# Patient Record
Sex: Female | Born: 1955 | Race: White | Hispanic: No | Marital: Married | Smoking: Never smoker
Health system: Southern US, Community
[De-identification: ages and names within clinical notes are randomized; demographics above are authoritative.]

## PROBLEM LIST (undated history)

## (undated) DIAGNOSIS — T7840XA Allergy, unspecified, initial encounter: Secondary | ICD-10-CM

## (undated) DIAGNOSIS — J189 Pneumonia, unspecified organism: Secondary | ICD-10-CM

## (undated) DIAGNOSIS — M6530 Trigger finger, unspecified finger: Secondary | ICD-10-CM

## (undated) DIAGNOSIS — M722 Plantar fascial fibromatosis: Secondary | ICD-10-CM

## (undated) DIAGNOSIS — F430 Acute stress reaction: Secondary | ICD-10-CM

## (undated) DIAGNOSIS — N3010 Interstitial cystitis (chronic) without hematuria: Secondary | ICD-10-CM

## (undated) DIAGNOSIS — R739 Hyperglycemia, unspecified: Secondary | ICD-10-CM

## (undated) DIAGNOSIS — K5900 Constipation, unspecified: Secondary | ICD-10-CM

## (undated) HISTORY — DX: Constipation, unspecified: K59.00

## (undated) HISTORY — DX: Hyperglycemia, unspecified: R73.9

## (undated) HISTORY — DX: Trigger finger, unspecified finger: M65.30

## (undated) HISTORY — PX: APPENDECTOMY: SHX54

## (undated) HISTORY — DX: Plantar fascial fibromatosis: M72.2

## (undated) HISTORY — PX: WISDOM TOOTH EXTRACTION: SHX21

## (undated) HISTORY — DX: Acute stress reaction: F43.0

## (undated) HISTORY — DX: Pneumonia, unspecified organism: J18.9

## (undated) HISTORY — DX: Allergy, unspecified, initial encounter: T78.40XA

## (undated) HISTORY — DX: Interstitial cystitis (chronic) without hematuria: N30.10

---

## 1999-07-02 ENCOUNTER — Other Ambulatory Visit: Admission: RE | Admit: 1999-07-02 | Discharge: 1999-07-02 | Payer: Self-pay | Admitting: Family Medicine

## 1999-12-30 ENCOUNTER — Encounter: Payer: Self-pay | Admitting: Family Medicine

## 1999-12-30 ENCOUNTER — Encounter: Admission: RE | Admit: 1999-12-30 | Discharge: 1999-12-30 | Payer: Self-pay | Admitting: Family Medicine

## 2000-08-05 ENCOUNTER — Encounter: Admission: RE | Admit: 2000-08-05 | Discharge: 2000-08-27 | Payer: Self-pay | Admitting: Family Medicine

## 2000-08-14 ENCOUNTER — Other Ambulatory Visit: Admission: RE | Admit: 2000-08-14 | Discharge: 2000-08-14 | Payer: Self-pay | Admitting: Family Medicine

## 2000-12-30 ENCOUNTER — Encounter: Payer: Self-pay | Admitting: Family Medicine

## 2000-12-30 ENCOUNTER — Encounter: Admission: RE | Admit: 2000-12-30 | Discharge: 2000-12-30 | Payer: Self-pay | Admitting: Family Medicine

## 2001-12-23 ENCOUNTER — Other Ambulatory Visit: Admission: RE | Admit: 2001-12-23 | Discharge: 2001-12-23 | Payer: Self-pay | Admitting: Family Medicine

## 2002-04-05 ENCOUNTER — Encounter: Admission: RE | Admit: 2002-04-05 | Discharge: 2002-04-05 | Payer: Self-pay | Admitting: Family Medicine

## 2002-04-05 ENCOUNTER — Encounter: Payer: Self-pay | Admitting: Family Medicine

## 2003-04-12 ENCOUNTER — Encounter: Admission: RE | Admit: 2003-04-12 | Discharge: 2003-04-12 | Payer: Self-pay | Admitting: Family Medicine

## 2004-01-03 ENCOUNTER — Other Ambulatory Visit: Admission: RE | Admit: 2004-01-03 | Discharge: 2004-01-03 | Payer: Self-pay | Admitting: Family Medicine

## 2004-04-16 ENCOUNTER — Encounter: Admission: RE | Admit: 2004-04-16 | Discharge: 2004-04-16 | Payer: Self-pay | Admitting: Family Medicine

## 2004-05-06 ENCOUNTER — Ambulatory Visit: Payer: Self-pay | Admitting: Family Medicine

## 2004-05-11 ENCOUNTER — Ambulatory Visit: Payer: Self-pay | Admitting: Family Medicine

## 2004-10-22 ENCOUNTER — Ambulatory Visit: Payer: Self-pay | Admitting: Family Medicine

## 2005-04-22 ENCOUNTER — Encounter: Admission: RE | Admit: 2005-04-22 | Discharge: 2005-04-22 | Payer: Self-pay | Admitting: Family Medicine

## 2005-06-04 ENCOUNTER — Ambulatory Visit: Payer: Self-pay | Admitting: Family Medicine

## 2005-06-06 ENCOUNTER — Ambulatory Visit: Payer: Self-pay | Admitting: Family Medicine

## 2005-06-06 ENCOUNTER — Other Ambulatory Visit: Admission: RE | Admit: 2005-06-06 | Discharge: 2005-06-06 | Payer: Self-pay | Admitting: Family Medicine

## 2005-08-06 ENCOUNTER — Ambulatory Visit: Payer: Self-pay | Admitting: Family Medicine

## 2005-08-13 ENCOUNTER — Ambulatory Visit: Payer: Self-pay | Admitting: Family Medicine

## 2006-06-02 ENCOUNTER — Encounter: Admission: RE | Admit: 2006-06-02 | Discharge: 2006-06-02 | Payer: Self-pay | Admitting: Family Medicine

## 2006-07-24 ENCOUNTER — Ambulatory Visit: Payer: Self-pay | Admitting: Family Medicine

## 2006-12-08 ENCOUNTER — Ambulatory Visit: Payer: Self-pay | Admitting: Family Medicine

## 2006-12-08 DIAGNOSIS — N959 Unspecified menopausal and perimenopausal disorder: Secondary | ICD-10-CM | POA: Insufficient documentation

## 2006-12-08 DIAGNOSIS — R7303 Prediabetes: Secondary | ICD-10-CM

## 2006-12-08 DIAGNOSIS — N3010 Interstitial cystitis (chronic) without hematuria: Secondary | ICD-10-CM

## 2006-12-08 DIAGNOSIS — F430 Acute stress reaction: Secondary | ICD-10-CM | POA: Insufficient documentation

## 2006-12-08 LAB — CONVERTED CEMR LAB
Bacteria, UA: 1
Bilirubin Urine: NEGATIVE
Blood in Urine, dipstick: NEGATIVE
Ketones, urine, test strip: NEGATIVE
Specific Gravity, Urine: 1.02

## 2006-12-09 ENCOUNTER — Encounter: Payer: Self-pay | Admitting: Family Medicine

## 2006-12-21 ENCOUNTER — Encounter (INDEPENDENT_AMBULATORY_CARE_PROVIDER_SITE_OTHER): Payer: Self-pay | Admitting: *Deleted

## 2006-12-28 ENCOUNTER — Ambulatory Visit: Payer: Self-pay | Admitting: Family Medicine

## 2007-02-26 ENCOUNTER — Encounter: Payer: Self-pay | Admitting: Family Medicine

## 2007-02-26 ENCOUNTER — Other Ambulatory Visit: Admission: RE | Admit: 2007-02-26 | Discharge: 2007-02-26 | Payer: Self-pay | Admitting: Family Medicine

## 2007-02-26 ENCOUNTER — Ambulatory Visit: Payer: Self-pay | Admitting: Family Medicine

## 2007-03-02 LAB — CONVERTED CEMR LAB
ALT: 13 units/L (ref 0–35)
Albumin: 4.3 g/dL (ref 3.5–5.2)
Alkaline Phosphatase: 59 units/L (ref 39–117)
Bilirubin, Direct: 0.1 mg/dL (ref 0.0–0.3)
Chloride: 104 meq/L (ref 96–112)
Creatinine, Ser: 0.84 mg/dL (ref 0.40–1.20)
Glucose, Bld: 79 mg/dL (ref 70–99)
HCT: 40.5 % (ref 36.0–46.0)
Hemoglobin: 13.5 g/dL (ref 12.0–15.0)
Hgb A1c MFr Bld: 5.4 % (ref 4.6–6.1)
Indirect Bilirubin: 0.3 mg/dL (ref 0.0–0.9)
LDL Cholesterol: 91 mg/dL (ref 0–99)
MCHC: 33.3 g/dL (ref 30.0–36.0)
MCV: 89 fL (ref 78.0–100.0)
Potassium: 3.6 meq/L (ref 3.5–5.3)
RBC: 4.55 M/uL (ref 3.87–5.11)
RDW: 12.4 % (ref 11.5–14.0)
Sodium: 140 meq/L (ref 135–145)
TSH: 1.118 microintl units/mL (ref 0.350–5.50)
Total Protein: 6.9 g/dL (ref 6.0–8.3)
VLDL: 11 mg/dL (ref 0–40)
WBC: 5.8 10*3/uL (ref 4.0–10.5)

## 2007-03-04 ENCOUNTER — Encounter (INDEPENDENT_AMBULATORY_CARE_PROVIDER_SITE_OTHER): Payer: Self-pay | Admitting: *Deleted

## 2007-03-04 LAB — CONVERTED CEMR LAB: Pap Smear: NORMAL

## 2007-06-24 ENCOUNTER — Encounter: Admission: RE | Admit: 2007-06-24 | Discharge: 2007-06-24 | Payer: Self-pay | Admitting: Family Medicine

## 2007-06-30 ENCOUNTER — Encounter (INDEPENDENT_AMBULATORY_CARE_PROVIDER_SITE_OTHER): Payer: Self-pay | Admitting: *Deleted

## 2007-07-19 ENCOUNTER — Ambulatory Visit: Payer: Self-pay | Admitting: Family Medicine

## 2008-02-25 ENCOUNTER — Ambulatory Visit: Payer: Self-pay | Admitting: Family Medicine

## 2008-03-08 ENCOUNTER — Telehealth: Payer: Self-pay | Admitting: Family Medicine

## 2008-05-29 ENCOUNTER — Ambulatory Visit: Payer: Self-pay | Admitting: Family Medicine

## 2008-07-06 ENCOUNTER — Encounter: Admission: RE | Admit: 2008-07-06 | Discharge: 2008-07-06 | Payer: Self-pay | Admitting: Family Medicine

## 2008-07-10 ENCOUNTER — Encounter (INDEPENDENT_AMBULATORY_CARE_PROVIDER_SITE_OTHER): Payer: Self-pay | Admitting: *Deleted

## 2008-08-18 ENCOUNTER — Ambulatory Visit: Payer: Self-pay | Admitting: Family Medicine

## 2008-10-24 ENCOUNTER — Ambulatory Visit: Payer: Self-pay | Admitting: Family Medicine

## 2008-10-24 LAB — CONVERTED CEMR LAB
Casts: 0 /lpf
Urobilinogen, UA: 0.2

## 2008-11-30 ENCOUNTER — Ambulatory Visit: Payer: Self-pay | Admitting: Family Medicine

## 2008-11-30 ENCOUNTER — Other Ambulatory Visit: Admission: RE | Admit: 2008-11-30 | Discharge: 2008-11-30 | Payer: Self-pay | Admitting: Family Medicine

## 2008-11-30 ENCOUNTER — Encounter: Payer: Self-pay | Admitting: Family Medicine

## 2008-11-30 LAB — HM PAP SMEAR

## 2008-12-05 LAB — CONVERTED CEMR LAB
ALT: 20 units/L (ref 0–35)
AST: 22 units/L (ref 0–37)
BUN: 17 mg/dL (ref 6–23)
Basophils Absolute: 0 10*3/uL (ref 0.0–0.1)
Bilirubin, Direct: 0 mg/dL (ref 0.0–0.3)
CO2: 32 meq/L (ref 19–32)
Calcium: 9.4 mg/dL (ref 8.4–10.5)
Chloride: 102 meq/L (ref 96–112)
Creatinine, Ser: 0.9 mg/dL (ref 0.4–1.2)
HCT: 40.9 % (ref 36.0–46.0)
HDL: 62.6 mg/dL (ref >39.00)
LDL Cholesterol: 113 mg/dL — ABNORMAL HIGH (ref 0–99)
Monocytes Relative: 7.3 % (ref 3.0–12.0)
Neutrophils Relative %: 51.7 % (ref 43.0–77.0)
Platelets: 245 10*3/uL (ref 150.0–400.0)
Potassium: 4 meq/L (ref 3.5–5.1)
Sodium: 141 meq/L (ref 135–145)
TSH: 1.05 microintl units/mL (ref 0.35–5.50)
Total CHOL/HDL Ratio: 3
Triglycerides: 94 mg/dL (ref 0.0–149.0)

## 2008-12-06 ENCOUNTER — Encounter (INDEPENDENT_AMBULATORY_CARE_PROVIDER_SITE_OTHER): Payer: Self-pay | Admitting: *Deleted

## 2009-07-11 ENCOUNTER — Encounter: Admission: RE | Admit: 2009-07-11 | Discharge: 2009-07-11 | Payer: Self-pay | Admitting: Family Medicine

## 2009-07-16 ENCOUNTER — Encounter (INDEPENDENT_AMBULATORY_CARE_PROVIDER_SITE_OTHER): Payer: Self-pay | Admitting: *Deleted

## 2009-10-15 ENCOUNTER — Ambulatory Visit: Payer: Self-pay | Admitting: Family Medicine

## 2009-12-17 ENCOUNTER — Telehealth (INDEPENDENT_AMBULATORY_CARE_PROVIDER_SITE_OTHER): Payer: Self-pay | Admitting: *Deleted

## 2009-12-17 ENCOUNTER — Ambulatory Visit: Payer: Self-pay | Admitting: Family Medicine

## 2009-12-17 LAB — CONVERTED CEMR LAB
ALT: 14 units/L (ref 0–35)
AST: 20 units/L (ref 0–37)
Basophils Relative: 0.4 % (ref 0.0–3.0)
Bilirubin, Direct: 0.1 mg/dL (ref 0.0–0.3)
CO2: 30 meq/L (ref 19–32)
Cholesterol: 206 mg/dL — ABNORMAL HIGH (ref 0–200)
Creatinine, Ser: 0.8 mg/dL (ref 0.4–1.2)
Eosinophils Relative: 2.1 % (ref 0.0–5.0)
GFR calc non Af Amer: 82.82 mL/min (ref >60)
Glucose, Bld: 94 mg/dL (ref 70–99)
Lymphocytes Relative: 40.9 % (ref 12.0–46.0)
Lymphs Abs: 1.7 10*3/uL (ref 0.7–4.0)
MCHC: 34.4 g/dL (ref 30.0–36.0)
MCV: 88.2 fL (ref 78.0–100.0)
Monocytes Relative: 7 % (ref 3.0–12.0)
Neutrophils Relative %: 49.6 % (ref 43.0–77.0)
Platelets: 251 10*3/uL (ref 150.0–400.0)
Potassium: 3.9 meq/L (ref 3.5–5.1)
Triglycerides: 91 mg/dL (ref 0.0–149.0)
WBC: 4 10*3/uL — ABNORMAL LOW (ref 4.5–10.5)

## 2009-12-21 ENCOUNTER — Ambulatory Visit: Payer: Self-pay | Admitting: Family Medicine

## 2009-12-21 DIAGNOSIS — J309 Allergic rhinitis, unspecified: Secondary | ICD-10-CM

## 2010-01-03 ENCOUNTER — Ambulatory Visit: Payer: Self-pay | Admitting: Family Medicine

## 2010-01-03 LAB — FECAL OCCULT BLOOD, GUAIAC: Fecal Occult Blood: NEGATIVE

## 2010-01-04 ENCOUNTER — Encounter: Payer: Self-pay | Admitting: Family Medicine

## 2010-01-04 LAB — CONVERTED CEMR LAB: Fecal Occult Bld: NEGATIVE

## 2010-03-06 ENCOUNTER — Ambulatory Visit: Payer: Self-pay | Admitting: Family Medicine

## 2010-03-06 DIAGNOSIS — J069 Acute upper respiratory infection, unspecified: Secondary | ICD-10-CM | POA: Insufficient documentation

## 2010-06-17 ENCOUNTER — Ambulatory Visit
Admission: RE | Admit: 2010-06-17 | Discharge: 2010-06-17 | Payer: Self-pay | Source: Home / Self Care | Attending: Family Medicine | Admitting: Family Medicine

## 2010-06-27 NOTE — Progress Notes (Signed)
----   Converted from flag ---- ---- 12/16/2009 10:37 AM, Judith Part MD wrote: please check wellness panel and lipids v70.0 thanks  ---- 12/14/2009 9:58 AM, Mills Koller wrote: Patient is scheduled for cpx with you, need orders for labs w/ dx. Thanks, Terri ------------------------------

## 2010-06-27 NOTE — Letter (Signed)
Summary: Results Follow up Letter   at [facility]  [address] [place], [place] [address]   Phone: [phone]  Fax: [phone]    07/16/2009 MRN: [id number]    Tracy Blankenship [address] [place], [place]  [address]    Dear Ms. Blaker,  The following are the results of your recent test(s):  Test         Result    Pap Smear:        Normal _____  Not Normal _____ Comments: ______________________________________________________ Cholesterol: LDL(Bad cholesterol):         Your goal is less than:         HDL (Good cholesterol):       Your goal is more than: Comments:  ______________________________________________________ Mammogram:        Normal __X___  Not Normal _____ Comments:  Yearly follow up is recommended.   ___________________________________________________________________ Hemoccult:        Normal _____  Not normal _______ Comments:    _____________________________________________________________________ Other Tests:    We routinely do not discuss normal results over the telephone.  If you desire a copy of the results, or you have any questions about this information we can discuss them at your next office visit.   Sincerely,    Marne A. Milinda Antis, M.D.  MAT:lsf

## 2010-06-27 NOTE — Assessment & Plan Note (Signed)
Summary: CONGESTION,COUGH/CLE   Vital Signs:  Patient profile:   55 year old female Height:      63.25 inches Weight:      138.75 pounds BMI:     24.47 Temp:     99 degrees F oral Pulse rate:   76 / minute Pulse rhythm:   regular BP sitting:   118 / 76  (left arm) Cuff size:   regular  Vitals Entered By: Lewanda Rife LPN (Oct 15, 2009 3:41 PM) CC: non productive cough and congestion   History of Present Illness: has been sick for over a month  went to walk in clinic twice -- tx for sinus infection  first visit -steroids-- ? dose pack / astepro  got a little better then symptoms came back  then f/u and then tx with zpak -- did not help  then tried advil cold and sinus -- got a rash and stopped it   is feeling slt better but lingering symptoms  coughing some -- is dry cough still congested -- and used netti pot - which helps some  not getting a lot out of her nose  facial pressure and pain is improved but not completely gone  ears -- bother her with pressure / not pain   is still using nasonex   has not run a fever  right now is 99   she had a throat cx - neg for strep     Allergies: 1)  ! Penicillin 2)  ! Sudafed  Past History:  Past Medical History: Last updated: 11/30/2008 chronic sinusitis  IC  hyperglycemia  stress reaction   Past Surgical History: Last updated: 12/08/2006 Appendectomy Caesarean section Hydrodistention of the bladder CT sinuses- sinusitis  Family History: Last updated: 11/30/2008 Father: cardiomyopathy, DM Mother: emphysema, HTN,CAD, lung cancer, lupus, anxiety Siblings: 1 brother, 1 sister- no problems sister OP aunt uncle OP  Social History: Last updated: 02/25/2008 Marital Status: Married Children: 2 Occupation: Runner, broadcasting/film/video non smoker  Risk Factors: Smoking Status: never (12/08/2006)  Review of Systems General:  Complains of fatigue and malaise; denies chills and fever. Eyes:  Denies blurring, discharge, and eye  irritation. ENT:  Complains of earache, nasal congestion, postnasal drainage, sinus pressure, and sore throat. CV:  Denies chest pain or discomfort and palpitations. Resp:  Complains of cough and sputum productive; denies pleuritic, shortness of breath, and wheezing. GI:  Denies diarrhea, nausea, and vomiting. Derm:  Denies rash.  Physical Exam  General:  fatigued but well appearin g Head:  normocephalic, atraumatic, and no abnormalities observed.  mild maxillary sinus tenderness  Eyes:  vision grossly intact, pupils equal, pupils round, pupils reactive to light, and no injection.   Ears:  R ear normal and L ear normal.  (TMs are dull but clear )  Nose:  nares are congested and injected bilat  Mouth:  pharynx pink and moist.   Neck:  No deformities, masses, or tenderness noted. Lungs:  Normal respiratory effort, chest expands symmetrically. Lungs are clear to auscultation, no crackles or wheezes. Heart:  Normal rate and regular rhythm. S1 and S2 normal without gallop, murmur, click, rub or other extra sounds. Skin:  Intact without suspicious lesions or rashes Cervical Nodes:  No lymphadenopathy noted Psych:  normal affect, talkative and pleasant    Impression & Recommendations:  Problem # 1:  SINUSITIS - ACUTE-NOS (ICD-461.9) Assessment New  likely beginning from allergies and partially improved after course of zithromax  will tx with levaquin (pt is pcn all ) -  that has worked well in the past  also tessalon for cough -- since non sedating  enc to continue nasonex pt advised to update me if symptoms worsen or do not improve - esp if worse facial pain or any fever  recommend sympt care- see pt instructions  -- continue netti pot  The following medications were removed from the medication list:    Cipro 250 Mg Tabs (Ciprofloxacin hcl) .Marland Kitchen... 1 by mouth two times a day for 5 days Her updated medication list for this problem includes:    Nasonex 50 Mcg/act Susp (Mometasone furoate)  .Marland Kitchen... 1 spray each nostril once daily as needed    Levaquin 500 Mg Tabs (Levofloxacin) .Marland Kitchen... 1 by mouth once daily for 10 days    Tessalon 200 Mg Caps (Benzonatate) .Marland Kitchen... 1 by mouth up to three times a day as needed for cough  Orders: Prescription Created Electronically [phone])  Complete Medication List: 1)  Nasonex 50 Mcg/act Susp (Mometasone furoate) .Marland Kitchen.. 1 spray each nostril once daily as needed 2)  Levaquin 500 Mg Tabs (Levofloxacin) .Marland Kitchen.. 1 by mouth once daily for 10 days 3)  Tessalon 200 Mg Caps (Benzonatate) .Marland Kitchen.. 1 by mouth up to three times a day as needed for cough  Patient Instructions: 1)  take the levaquin as directed for sinus infection 2)  use tessalon for cough as needed  3)  I sent these to your pharmacy  4)  continue lots of fluids - try to get some rest  5)  continue nasonex as directed 6)  if not starting to improve next week - let me know  Prescriptions: TESSALON 200 MG CAPS (BENZONATATE) 1 by mouth up to three times a day as needed for cough  #30 x 0   Entered and Authorized by:   Judith Part MD   Signed by:   Judith Part MD on 10/15/2009   Method used:   Electronically to        [facility] [phone]* (retail)       [address]       [place], [place]  [address]       Ph: [phone]       Fax: [phone]   RxID:   [id number] LEVAQUIN 500 MG TABS (LEVOFLOXACIN) 1 by mouth once daily for 10 days  #10 x 0   Entered and Authorized by:   Judith Part MD   Signed by:   Judith Part MD on 10/15/2009   Method used:   Electronically to        [facility] [phone]* (retail)       [address]       [place], [place]  [address]       Ph: [phone]       Fax: [phone]   RxID:   [id number]   Current Allergies (reviewed today): ! PENICILLIN ! SUDAFED

## 2010-06-27 NOTE — Letter (Signed)
Summary: [facility]: Immunoassay Fecal Occult Blood (iFOB) Order Form  [place] at [facility]  [address] [place], [place] [address]   Phone: [phone]  Fax: [phone]      [facility]: Immunoassay Fecal Occult Blood (iFOB) Order Form   December 21, 2009 MRN: [id number]   Tracy Blankenship [birth year]   Physicican Name:__________Tower_______________  Diagnosis Code:___________V76.51_______________      Judith Part MD

## 2010-06-27 NOTE — Miscellaneous (Signed)
Summary: stool test screening   Clinical Lists Changes  Observations: Added new observation of HEMOCCULT: negative (01/03/2010 9:53)      Preventive Care Screening  Hemoccult:    Date:  01/03/2010    Results:  negative

## 2010-06-27 NOTE — Letter (Signed)
Summary: Results Follow up Letter  [place] at [facility]  [address] [place], [place] [address]   Phone: [phone]  Fax: [phone]    01/04/2010 MRN: [id number]    Tracy Blankenship [address] [place], [place]  [address]    Dear Ms. Larsson,  The following are the results of your recent test(s):  Test         Result    Pap Smear:        Normal _____  Not Normal _____ Comments: ______________________________________________________ Cholesterol: LDL(Bad cholesterol):         Your goal is less than:         HDL (Good cholesterol):       Your goal is more than: Comments:  ______________________________________________________ Mammogram:        Normal _____  Not Normal _____ Comments:  ___________________________________________________________________ Hemoccult:        Normal __X___  Not normal _______ Comments:  Stool test was normal. _____________________________________________________________________ Other Tests:    We routinely do not discuss normal results over the telephone.  If you desire a copy of the results, or you have any questions about this information we can discuss them at your next office visit.   Sincerely,    Idamae Schuller Tower,MD  MT/ri

## 2010-06-27 NOTE — Assessment & Plan Note (Signed)
Summary: SINUS PROBLEMS / LFW   Vital Signs:  Patient profile:   55 year old female Height:      63.5 inches Weight:      143.50 pounds BMI:     25.11 Temp:     98.5 degrees F oral Pulse rate:   72 / minute Pulse rhythm:   regular BP sitting:   120 / 72  (right arm) Cuff size:   regular  Vitals Entered By: Linde Gillis CMA Duncan Dull) (June 17, 2010 2:45 PM) CC: sinus problems   History of Present Illness: 55 yo new to me with h/o recurrent sinus infections here with ?sinus infection.  Head congestion with frontal pressure started almost one week ago. Non productive cough. Ear feels full. Taking OTC Dayquil with some relief Also using her Nasonex.   felt a little feverish last night, with body aches.   Current Medications (verified): 1)  Nasonex 50 Mcg/act  Susp (Mometasone Furoate) .Marland Kitchen.. 1 Spray Each Nostril Once Daily As Needed 2)  Tylenol Extra Strength 500 Mg Tabs (Acetaminophen) .... Otc As Directed. 3)  Levaquin 500 Mg Tabs (Levofloxacin) .... Take 1 Tab By Mouth Daily X 10 Days  Allergies: 1)  ! Penicillin 2)  ! Sudafed  Past History:  Past Medical History: Last updated: 12/21/2009 chronic sinusitis  IC  hyperglycemia  stress reaction  plantar fasciitis recurrent trigger thumb   Past Surgical History: Last updated: 12/08/2006 Appendectomy Caesarean section Hydrodistention of the bladder CT sinuses- sinusitis  Family History: Last updated: 12/21/2009 Father: cardiomyopathy, DM- passed  Mother: emphysema, HTN,CAD, lung cancer, lupus, anxiety Siblings: 1 brother, 1 sister- no problems sister OP aunt uncle OP  Social History: Last updated: 02/25/2008 Marital Status: Married Children: 2 Occupation: Runner, broadcasting/film/video non smoker  Risk Factors: Smoking Status: never (12/08/2006)  Review of Systems      See HPI General:  Complains of chills. ENT:  Complains of nasal congestion, postnasal drainage, sinus pressure, and sore throat. CV:  Denies chest pain  or discomfort.  Physical Exam  General:  Well-developed,well-nourished,in no acute distress; alert,appropriate and cooperative throughout examination Ears:  R ear normal and L ear normal.   Nose:  nares are injected and congested bilaterally  sinsuses =/- TTP Mouth:  pharynx pink and moist, no erythema, and no exudates.   Lungs:  Normal respiratory effort, chest expands symmetrically. Lungs are clear to auscultation, no crackles or wheezes. Heart:  Normal rate and regular rhythm. S1 and S2 normal without gallop, murmur, click, rub or other extra sounds. Psych:  normal affect, talkative and pleasant    Impression & Recommendations:  Problem # 1:  URI (ICD-465.9) Assessment New with h/o recurrent sinusitis. given rx for levaquin to fill (PCN allergy) if symptoms worsen or do not improve. see pt instructions for details. The following medications were removed from the medication list:    Tessalon 200 Mg Caps (Benzonatate) .Marland Kitchen... 1 by mouth up to three times a day as needed cough Her updated medication list for this problem includes:    Tylenol Extra Strength 500 Mg Tabs (Acetaminophen) ..... Otc as directed.  Complete Medication List: 1)  Nasonex 50 Mcg/act Susp (Mometasone furoate) .Marland Kitchen.. 1 spray each nostril once daily as needed 2)  Tylenol Extra Strength 500 Mg Tabs (Acetaminophen) .... Otc as directed. 3)  Levaquin 500 Mg Tabs (Levofloxacin) .... Take 1 tab by mouth daily x 10 days  Patient Instructions: 1)  Fill levaquin if symptoms do not improve over next few days.  Drink  lots of fluids.  Treat sympotmatically with dayqui, nasal saline irrigation, and Tylenol/Ibuprofen. Call if not improving as expected in 5-7 days.  Prescriptions: LEVAQUIN 500 MG TABS (LEVOFLOXACIN) Take 1 tab by mouth daily x 10 days  #10 x 0   Entered and Authorized by:   Ruthe Mannan MD   Signed by:   Ruthe Mannan MD on 06/17/2010   Method used:   Print then Give to Patient   RxID:    [id number]    Orders Added: 1)  Est. Patient Level III [884[address]]    Current Allergies (reviewed today): ! PENICILLIN ! SUDAFED

## 2010-06-27 NOTE — Assessment & Plan Note (Signed)
Summary: CPX W/PAP /RBH   Vital Signs:  Patient profile:   55 year old female Height:      63.5 inches Weight:      142.75 pounds BMI:     24.98 Temp:     98.5 degrees F oral Pulse rate:   84 / minute Pulse rhythm:   regular BP sitting:   114 / 78  (left arm) Cuff size:   regular  Vitals Entered By: Lewanda Rife LPN (December 21, 2009 9:59 AM) CC: CPX with pap and breast exam LMP 2009   History of Present Illness: wt is up 3 lb today (had lost and then gained with stress in life)-- got too thin for a while  here for health mt exam and to review chronic med problems   has been feeling ok overall - staying busy   had a foot problem last summer - went to ortho - used metatarsal pads in shoes  got worse- could not walk in am , went to [facility] has plantar fasciitis - pretty severe  using foot straps  has f/u next mo  suggested anti inflammatories - has not started (mobic 15 mg)  has been taking 2 advil every night - is helping  is thinking about trying the mobic   got rid of her non supportive shoes  will be fit for orthotics  father passed in jan- no longer caring for her parents   also has trigger thumb  in past nsaid made her more constipated   bp is good lipids up a bit with LDL of126 and good HDL 61 does cheat with her diet occas  some biscuits some yogurt for lunch and some cereal with fruit    other labs ok   pap 7/10 nl  no hx of abn paps and no new sexual partner  no symptoms or problems  is post menopausal 2 years  mam 2/11 self exam - no lumps or changes   nl dexa 08 ca and D-- is not good about that   Td08  is not interested in colonosc yet is open to doing the stool card   Allergies: 1)  ! Penicillin 2)  ! Sudafed  Past History:  Past Surgical History: Last updated: 12/08/2006 Appendectomy Caesarean section Hydrodistention of the bladder CT sinuses- sinusitis  Family History: Last updated: 12/21/2009 Father: cardiomyopathy, DM-  passed  Mother: emphysema, HTN,CAD, lung cancer, lupus, anxiety Siblings: 1 brother, 1 sister- no problems sister OP aunt uncle OP  Social History: Last updated: 02/25/2008 Marital Status: Married Children: 2 Occupation: Runner, broadcasting/film/video non smoker  Risk Factors: Smoking Status: never (12/08/2006)  Past Medical History: chronic sinusitis  IC  hyperglycemia  stress reaction  plantar fasciitis recurrent trigger thumb   Family History: Father: cardiomyopathy, DM- passed  Mother: emphysema, HTN,CAD, lung cancer, lupus, anxiety Siblings: 1 brother, 1 sister- no problems sister OP aunt uncle OP  Review of Systems General:  Denies fatigue, loss of appetite, and malaise. Eyes:  Denies blurring and eye irritation. CV:  Denies chest pain or discomfort, lightheadness, palpitations, shortness of breath with exertion, and swelling of feet. Resp:  Denies cough, shortness of breath, and wheezing. GI:  Denies abdominal pain, change in bowel habits, indigestion, and nausea. GU:  Denies abnormal vaginal bleeding, discharge, and dysuria. MS:  Complains of joint pain and stiffness; denies joint redness and joint swelling. Derm:  Denies itching, lesion(s), poor wound healing, and rash. Neuro:  Denies numbness and tingling. Psych:  Denies panic  attacks. Endo:  Denies cold intolerance, excessive thirst, excessive urination, and heat intolerance. Heme:  Denies abnormal bruising and bleeding.  Physical Exam  General:  Well-developed,well-nourished,in no acute distress; alert,appropriate and cooperative throughout examination Head:  normocephalic, atraumatic, and no abnormalities observed.   Eyes:  vision grossly intact, pupils equal, pupils round, and pupils reactive to light.  no conjunctival pallor, injection or icterus  Ears:  R ear normal and L ear normal.   Nose:  no nasal discharge.   Mouth:  pharynx pink and moist.   Neck:  supple with full rom and no masses or thyromegally, no JVD or  carotid bruit  Chest Wall:  No deformities, masses, or tenderness noted. Breasts:  No mass, nodules, thickening, tenderness, bulging, retraction, inflamation, nipple discharge or skin changes noted.   Lungs:  Normal respiratory effort, chest expands symmetrically. Lungs are clear to auscultation, no crackles or wheezes. Heart:  Normal rate and regular rhythm. S1 and S2 normal without gallop, murmur, click, rub or other extra sounds. Abdomen:  Bowel sounds positive,abdomen soft and non-tender without masses, organomegaly or hernias noted. no renal bruits  Msk:  No deformity or scoliosis noted of thoracic or lumbar spine.  no acute joint changes  Pulses:  R and L carotid,radial,femoral,dorsalis pedis and posterior tibial pulses are full and equal bilaterally Extremities:  No clubbing, cyanosis, edema, or deformity noted with normal full range of motion of all joints.   Neurologic:  sensation intact to light touch, gait normal, and DTRs symmetrical and normal.   Skin:  Intact without suspicious lesions or rashes Cervical Nodes:  No lymphadenopathy noted Axillary Nodes:  No palpable lymphadenopathy Inguinal Nodes:  No significant adenopathy Psych:  normal affect, talkative and pleasant    Impression & Recommendations:  Problem # 1:  HEALTH MAINTENANCE EXAM (ICD-V70.0) Assessment Comment Only  reviewed health habits including diet, exercise and skin cancer prevention reviewed health maintenance list and family history reviewed labs in detail today disc diet change for higher cholesterol  stool card given - declines colonosc  Orders: Prescription Created Electronically [phone])  Problem # 2:  ALLERGIC RHINITIS (ICD-477.9) Assessment: Unchanged  refilled nasonex for as needed use  Her updated medication list for this problem includes:    Nasonex 50 Mcg/act Susp (Mometasone furoate) .Marland Kitchen... 1 spray each nostril once daily as needed  Orders: Prescription Created Electronically  [phone])  Complete Medication List: 1)  Nasonex 50 Mcg/act Susp (Mometasone furoate) .Marland Kitchen.. 1 spray each nostril once daily as needed  Patient Instructions: 1)  the current recommendation for calcium intake is 1200-1500 mg daily with 1000 IU of vitamin D  2)  regular exercise is important too  3)  please do stool card for colon cancer screening 4)  you can raise your HDL (good cholesterol) by increasing exercise and eating omega 3 fatty acid supplement like fish oil or flax seed oil over the counter 5)  you can lower LDL (bad cholesterol) by limiting saturated fats in diet like red meat, fried foods, egg yolks, fatty breakfast meats, high fat dairy products and shellfish  Prescriptions: NASONEX 50 MCG/ACT  SUSP (MOMETASONE FUROATE) 1 spray each nostril once daily as needed  #1 month x 11   Entered and Authorized by:   Judith Part MD   Signed by:   Judith Part MD on 12/21/2009   Method used:   [facility]        [facility] [phone]* (retail)  [address] [place], [place]  [address]       Ph: [phone]       Fax: [phone]   RxID:   [id number]   Current Allergies (reviewed today): ! PENICILLIN ! SUDAFED   Prevention & Chronic Care Immunizations   Influenza vaccine: Not documented    Tetanus booster: 02/26/2007: Tdap    Pneumococcal vaccine: Not documented  Colorectal Screening   Hemoccult: Not documented    Colonoscopy: Not documented  Other Screening   Pap smear: NEGATIVE FOR INTRAEPITHELIAL LESIONS OR MALIGNANCY.  (11/30/2008)    Mammogram: ASSESSMENT: Negative - BI-RADS 1^MM DIGITAL SCREENING  (07/11/2009)   Mammogram due: 06/2008   Smoking status: never  (12/08/2006)  Lipids   Total Cholesterol: 206  (12/17/2009)   LDL: 113  (11/30/2008)   LDL Direct: 126.5  (12/17/2009)   HDL: 61.90  (12/17/2009)   Triglycerides: 91.0  (12/17/2009)

## 2010-06-27 NOTE — Assessment & Plan Note (Signed)
Summary: sinus infection/alc   Vital Signs:  Patient profile:   55 year old female Height:      63.5 inches Weight:      143.25 pounds BMI:     25.07 Temp:     98.4 degrees F oral Pulse rate:   80 / minute Pulse rhythm:   regular BP sitting:   110 / 74  (left arm) Cuff size:   regular  Vitals Entered By: Lewanda Rife LPN (March 06, 2010 3:38 PM) CC: sinus infection with head congestion and thinks drainage at back of throat, sorethroat and non productive cough   History of Present Illness: started getting sick friday  is getting worse instead of better  head congestion  with some pain in forehead  sore throat and drainage  (not as bad as it was)   non productive cough  hoarseness   feels more than just a cold    saturday felt feverish / and some flushing  chills / aches on friday night   nasally- cannot get anything out  tries to use netti pot   Allergies: 1)  ! Penicillin 2)  ! Sudafed  Past History:  Past Medical History: Last updated: 12/21/2009 chronic sinusitis  IC  hyperglycemia  stress reaction  plantar fasciitis recurrent trigger thumb   Past Surgical History: Last updated: 12/08/2006 Appendectomy Caesarean section Hydrodistention of the bladder CT sinuses- sinusitis  Family History: Last updated: 12/21/2009 Father: cardiomyopathy, DM- passed  Mother: emphysema, HTN,CAD, lung cancer, lupus, anxiety Siblings: 1 brother, 1 sister- no problems sister OP aunt uncle OP  Social History: Last updated: 02/25/2008 Marital Status: Married Children: 2 Occupation: Runner, broadcasting/film/video non smoker  Risk Factors: Smoking Status: never (12/08/2006)  Review of Systems General:  Denies fatigue, fever, loss of appetite, and malaise. Eyes:  Denies blurring and eye pain. ENT:  Complains of earache, hoarseness, nasal congestion, postnasal drainage, sinus pressure, and sore throat; denies ear discharge. CV:  Denies chest pain or discomfort and palpitations. Resp:   Complains of cough; denies shortness of breath and wheezing. GI:  Denies abdominal pain and indigestion. GU:  Denies dysuria. Derm:  Denies itching and rash. Allergy:  Complains of seasonal allergies.  Physical Exam  General:  Well-developed,well-nourished,in no acute distress; alert,appropriate and cooperative throughout examination Head:  mild ethmoid sinus tenderness Eyes:  vision grossly intact, pupils equal, pupils round, pupils reactive to light, and no injection.   Ears:  R ear normal and L ear normal.   Nose:  nares are injected and congested bilaterally  Mouth:  pharynx pink and moist, no erythema, and no exudates.   Neck:  No deformities, masses, or tenderness noted. Lungs:  Normal respiratory effort, chest expands symmetrically. Lungs are clear to auscultation, no crackles or wheezes. Heart:  Normal rate and regular rhythm. S1 and S2 normal without gallop, murmur, click, rub or other extra sounds. Skin:  Intact without suspicious lesions or rashes Cervical Nodes:  No lymphadenopathy noted Psych:  normal affect, talkative and pleasant    Impression & Recommendations:  Problem # 1:  URI (ICD-465.9) Assessment New recommend sympt care- see pt instructions   pt advised to update me if symptoms worsen or do not improve if facial pain or no imp by monday will fill px for levaquin and update (has hx of frequent sinusitis) Her updated medication list for this problem includes:    Tylenol Extra Strength 500 Mg Tabs (Acetaminophen) ..... Otc as directed.    Tessalon 200 Mg Caps (Benzonatate) .Marland KitchenMarland KitchenMarland KitchenMarland Kitchen  1 by mouth up to three times a day as needed cough  Complete Medication List: 1)  Nasonex 50 Mcg/act Susp (Mometasone furoate) .Marland Kitchen.. 1 spray each nostril once daily as needed 2)  Tylenol Extra Strength 500 Mg Tabs (Acetaminophen) .... Otc as directed. 3)  Levaquin 500 Mg Tabs (Levofloxacin) .Marland Kitchen.. 1 by mouth once daily for 10 days for sinus infection 4)  Tessalon 200 Mg Caps  (Benzonatate) .Marland Kitchen.. 1 by mouth up to three times a day as needed cough  Patient Instructions: 1)  you can try mucinex over the counter twice daily as directed and nasal saline spray for congestion 2)  tylenol over the counter as directed may help with aches, headache and fever 3)  call if symptoms worsen or if not improved in 4-5 days  4)  if worse or not improving by monday (especially if facial pain)- fill px for levaquin for sinus infection 5)  take the tessalon for cough as needed  Prescriptions: TESSALON 200 MG CAPS (BENZONATATE) 1 by mouth up to three times a day as needed cough  #30 x 0   Entered and Authorized by:   Judith Part MD   Signed by:   Judith Part MD on 03/06/2010   Method used:   Print then Give to Patient   RxID:   [id number] LEVAQUIN 500 MG TABS (LEVOFLOXACIN) 1 by mouth once daily for 10 days for sinus infection  #10 x 0   Entered and Authorized by:   Judith Part MD   Signed by:   Judith Part MD on 03/06/2010   Method used:   Print then Give to Patient   RxID:   [id number]   Current Allergies (reviewed today): ! PENICILLIN ! SUDAFED

## 2010-07-22 ENCOUNTER — Other Ambulatory Visit: Payer: Self-pay | Admitting: Family Medicine

## 2010-07-22 DIAGNOSIS — Z1231 Encounter for screening mammogram for malignant neoplasm of breast: Secondary | ICD-10-CM

## 2010-07-31 ENCOUNTER — Ambulatory Visit
Admission: RE | Admit: 2010-07-31 | Discharge: 2010-07-31 | Disposition: A | Discharge status: Home / Self Care | Payer: BC Managed Care – PPO | Source: Ambulatory Visit | Attending: Family Medicine | Admitting: Family Medicine

## 2010-07-31 DIAGNOSIS — Z1231 Encounter for screening mammogram for malignant neoplasm of breast: Secondary | ICD-10-CM

## 2010-08-05 ENCOUNTER — Encounter (INDEPENDENT_AMBULATORY_CARE_PROVIDER_SITE_OTHER): Payer: Self-pay | Admitting: *Deleted

## 2010-08-13 NOTE — Letter (Signed)
Summary: Results Follow up Letter  [place] [facility]  [address] [place], [place] [address]   Phone: [phone]  Fax: [phone]    08/05/2010 MRN: [id number]    Tracy Blankenship [address] [place], [place]  [address]      Dear Ms. Forget,  The following are the results of your recent test(s):  Test         Result    Pap Smear:        Normal _____  Not Normal _____ Comments: ______________________________________________________ Cholesterol: LDL(Bad cholesterol):         Your goal is less than:         HDL (Good cholesterol):       Your goal is more than: Comments:  ______________________________________________________ Mammogram:        Normal __X___  Not Normal _____ Comments: Repeat in 1 year  ___________________________________________________________________ Hemoccult:        Normal _____  Not normal _______ Comments:    _____________________________________________________________________ Other Tests:    We routinely do not discuss normal results over the telephone.  If you desire a copy of the results, or you have any questions about this information we can discuss them at your next office visit.   Sincerely,     Sharilyn Sites for Dr. Roxy Manns

## 2010-08-13 NOTE — Miscellaneous (Signed)
Summary: Mammogram to flowsheet   Clinical Lists Changes  Observations: Added new observation of MAMMO DUE: 07/2011 (08/05/2010 13:35) Added new observation of MAMMOGRAM: normal (07/31/2010 13:35)      Preventive Care Screening  Mammogram:    Date:  07/31/2010    Next Due:  07/2011    Results:  normal

## 2011-01-07 ENCOUNTER — Encounter: Payer: Self-pay | Admitting: Family Medicine

## 2011-01-07 ENCOUNTER — Ambulatory Visit (INDEPENDENT_AMBULATORY_CARE_PROVIDER_SITE_OTHER): Payer: BC Managed Care – PPO | Admitting: Family Medicine

## 2011-01-07 DIAGNOSIS — R32 Unspecified urinary incontinence: Secondary | ICD-10-CM | POA: Insufficient documentation

## 2011-01-07 DIAGNOSIS — R300 Dysuria: Secondary | ICD-10-CM

## 2011-01-07 LAB — POCT URINALYSIS DIPSTICK
Blood, UA: NEGATIVE
Ketones, UA: NEGATIVE
Leukocytes, UA: NEGATIVE
Nitrite, UA: NEGATIVE
Protein, UA: NEGATIVE
Urobilinogen, UA: NEGATIVE
pH, UA: 5

## 2011-01-07 NOTE — Progress Notes (Deleted)
  Subjective:    Patient ID: Tracy Blankenship, female    DOB: [birth year], 55 y.o.   MRN: [id number]  HPI    Review of Systems     Objective:   Physical Exam        Assessment & Plan:

## 2011-01-07 NOTE — Progress Notes (Signed)
SUBJECTIVE: Tracy Blankenship is a 55 y.o. female who complains of urinary frequency, urgency and urge incontinence for 1 month. No dysuria, flank pain, fever, chills, or abnormal vaginal discharge or bleeding.   Does have remote h/o Interstitial cystitis.  Patient Active Problem List  Diagnoses  . STRESS REACTION, ACUTE  . URI  . ALLERGIC RHINITIS  . INTERSTITIAL CYSTITIS  . MENOPAUSAL DISORDER  . HYPERGLYCEMIA   Past Medical History  Diagnosis Date  . Chronic sinusitis   . IC (interstitial cystitis)   . Hyperglycemia   . Stress reaction   . Plantar fasciitis   . Trigger finger    Past Surgical History  Procedure Date  . Appendectomy   . Cesarean section    History  Substance Use Topics  . Smoking status: Never Smoker   . Smokeless tobacco: Not on file  . Alcohol Use: No   Family History  Problem Relation Age of Onset  . Anxiety disorder Mother   . Lupus Mother   . Emphysema Mother   . Coronary artery disease Mother   . Cancer Mother     lung  . Cardiomyopathy Father   . Diabetes Father    Allergies  Allergen Reactions  . Penicillins     REACTION: ? allergy  . Pseudoephedrine     REACTION: makes her hyper   No current outpatient prescriptions on file prior to visit.   The PMH, PSH, Social History, Family History, Medications, and allergies have been reviewed in [facility], and have been updated if relevant.  OBJECTIVE:  BP 130/80  Pulse 84  Temp(Src) 98 F (36.7 C) (Oral)  Ht 5\' 3"  (1.6 m)  Wt 143 lb 12.8 oz (65.227 kg)  BMI 25.47 kg/m2  SpO2 98%  Appears well, in no apparent distress.  Vital signs are normal. The abdomen is soft without tenderness, guarding, mass, rebound or organomegaly. No CVA tenderness or inguinal adenopathy noted. Urine dipstick negative.  Assessment and Plan: Urge incontinence- given her complicated h/o IC s/p several interventions, she does need urology referral for further work up. She is hesitant to try OAB medications due to side  effects. Orders Placed This Encounter  Procedures  . HM MAMMOGRAPHY    This external order was created through the Results Console.  Marland Kitchen HM PAP SMEAR    This external order was created through the Results Console.  . Fecal Occult Blood, Guaiac    This external order was created through the Results Console.  . Ambulatory referral to Urology    Referral Priority:  Routine    Referral Type:  Consultation    Referral Reason:  Specialty Services Required    Requested Specialty:  Urology    Number of Visits Requested:  1  . POCT Urinalysis Dipstick

## 2011-01-23 ENCOUNTER — Other Ambulatory Visit: Payer: Self-pay | Admitting: *Deleted

## 2011-01-23 MED ORDER — MOMETASONE FUROATE 50 MCG/ACT NA SUSP: 1.0000 | Freq: Every day | NASAL | Status: DC

## 2011-03-14 ENCOUNTER — Other Ambulatory Visit: Payer: Self-pay | Admitting: *Deleted

## 2011-03-14 MED ORDER — MOMETASONE FUROATE 50 MCG/ACT NA SUSP: 1.0000 | Freq: Every day | NASAL | Status: DC

## 2011-03-30 ENCOUNTER — Telehealth: Payer: Self-pay | Admitting: Family Medicine

## 2011-03-30 DIAGNOSIS — Z0000 Encounter for general adult medical examination without abnormal findings: Secondary | ICD-10-CM

## 2011-03-30 DIAGNOSIS — R7309 Other abnormal glucose: Secondary | ICD-10-CM

## 2011-03-30 NOTE — Telephone Encounter (Signed)
Message copied by Judy Pimple on Sun Mar 30, 2011  8:21 PM ------      Message from: [place], [place] J      Created: Fri Mar 28, 2011  3:44 PM      Regarding: Labs for Tuesday, 11-6       Patient is scheduled for CPX labs, please order future labs, Thanks , Camelia Eng

## 2011-04-01 ENCOUNTER — Other Ambulatory Visit (INDEPENDENT_AMBULATORY_CARE_PROVIDER_SITE_OTHER): Payer: BC Managed Care – PPO

## 2011-04-01 DIAGNOSIS — Z0000 Encounter for general adult medical examination without abnormal findings: Secondary | ICD-10-CM

## 2011-04-01 DIAGNOSIS — R7309 Other abnormal glucose: Secondary | ICD-10-CM

## 2011-04-01 LAB — LIPID PANEL
LDL Cholesterol: 107 mg/dL — ABNORMAL HIGH (ref 0–99)
Total CHOL/HDL Ratio: 3
Triglycerides: 62 mg/dL (ref 0.0–149.0)

## 2011-04-01 LAB — CBC WITH DIFFERENTIAL/PLATELET
Basophils Absolute: 0 10*3/uL (ref 0.0–0.1)
Hemoglobin: 13.9 g/dL (ref 12.0–15.0)
MCV: 89 fl (ref 78.0–100.0)
Neutro Abs: 1.7 10*3/uL (ref 1.4–7.7)
RBC: 4.61 Mil/uL (ref 3.87–5.11)

## 2011-04-01 LAB — COMPREHENSIVE METABOLIC PANEL
ALT: 14 U/L (ref 0–35)
AST: 18 U/L (ref 0–37)
Alkaline Phosphatase: 60 U/L (ref 39–117)
BUN: 16 mg/dL (ref 6–23)
Chloride: 105 mEq/L (ref 96–112)
Glucose, Bld: 88 mg/dL (ref 70–99)
Potassium: 3.9 mEq/L (ref 3.5–5.1)
Total Bilirubin: 0.7 mg/dL (ref 0.3–1.2)
Total Protein: 7.1 g/dL (ref 6.0–8.3)

## 2011-04-04 ENCOUNTER — Encounter: Payer: Self-pay | Admitting: Family Medicine

## 2011-04-07 ENCOUNTER — Encounter: Payer: BC Managed Care – PPO | Admitting: Family Medicine

## 2011-04-22 ENCOUNTER — Ambulatory Visit (INDEPENDENT_AMBULATORY_CARE_PROVIDER_SITE_OTHER): Payer: BC Managed Care – PPO | Admitting: Family Medicine

## 2011-04-22 ENCOUNTER — Encounter: Payer: Self-pay | Admitting: Family Medicine

## 2011-04-22 VITALS — BP 120/80 | HR 76 | Temp 98.2°F | Ht 63.0 in | Wt 141.8 lb

## 2011-04-22 DIAGNOSIS — Z01419 Encounter for gynecological examination (general) (routine) without abnormal findings: Secondary | ICD-10-CM | POA: Insufficient documentation

## 2011-04-22 DIAGNOSIS — R7309 Other abnormal glucose: Secondary | ICD-10-CM

## 2011-04-22 DIAGNOSIS — Z0000 Encounter for general adult medical examination without abnormal findings: Secondary | ICD-10-CM

## 2011-04-22 DIAGNOSIS — Z23 Encounter for immunization: Secondary | ICD-10-CM

## 2011-04-22 MED ORDER — MOMETASONE FUROATE 50 MCG/ACT NA SUSP: 1.0000 | Freq: Every day | NASAL | Status: DC

## 2011-04-22 MED ORDER — ESTROGENS, CONJUGATED 0.625 MG/GM VA CREA: TOPICAL_CREAM | VAGINAL | Status: DC

## 2011-04-22 NOTE — Patient Instructions (Addendum)
Try to get 1200-1500 mg of calcium per day with at least 1000 iu of vitamin D - for bone health If you want to schedule screening colonoscopy in the future- just call Flu shot today  Stay healthy- eat healthy diet and get exercise  Try premarin cream twice weekly for vaginal dryness

## 2011-04-22 NOTE — Progress Notes (Signed)
Subjective:    Patient ID: Tracy Blankenship, female    DOB: 1955-06-30, 55 y.o.   MRN: [id number]  HPI Here for annual health mt exam and to review chronic medical problems  Has been feeling ok overall  Nothing new medically   bp good at 120/80 Wt is down 2 lb with bmi of 25- good also Is always fighting the weight  Is a fair eater overall  Does not eat snack foods  No fried foods  Not enough vegetables   Does not drink milk- and not taking ca and D  Had a bone density test a few years ago was good   Labs ok   Lipids good with LDL 107 and HDL in 60s Lab Results  Component Value Date   CHOL 184 04/01/2011   CHOL 206* 12/17/2009   CHOL 194 11/30/2008   Lab Results  Component Value Date   HDL 64.20 04/01/2011   HDL 08.65 12/17/2009   HDL 78.46 11/30/2008   Lab Results  Component Value Date   LDLCALC 107* 04/01/2011   LDLCALC 113* 11/30/2008   LDLCALC 91 02/26/2007   Lab Results  Component Value Date   TRIG 62.0 04/01/2011   TRIG 91.0 12/17/2009   TRIG 94.0 11/30/2008   Lab Results  Component Value Date   CHOLHDL 3 04/01/2011   CHOLHDL 3 12/17/2009   CHOLHDL 3 11/30/2008   Lab Results  Component Value Date   LDLDIRECT 126.5 12/17/2009   diet- is not high in fats  Does not eat red meat  Lots of chicken - usually not fried  Biggest downfall - biscuit every am   Lunch is yogurt or fruit   Hyperglycemia in past  This draw ok - fasting glucose 88 and a1c 5.6 Father was type 2 DM- so she scaled back on calories and lost weight   Rest of wellness labs ok   Colon cancer screen- never had a colonoscopy - not ready for it yet -may call back about that  Neg ifob 8/11  Flu shot - does not have them yet , wants one today  She is a Runner, broadcasting/film/video    Pap 7/10- last year did skip it  No gyn symptoms or problems  No new sexual partners Her drive is down overall  Also has vaginal drynes- uses lubricants  Is interested in premarin cream  Is menopausal  No bleeding at all   Has been  to [facility]rology- Dr Macdirmid - tx for uti  Cystoscopy  Her symptoms are better   Mam 3/12 nl  Self exam - no lumps or changes   TD 08  Patient Active Problem List  Diagnoses  . STRESS REACTION, ACUTE  . ALLERGIC RHINITIS  . INTERSTITIAL CYSTITIS  . MENOPAUSAL DISORDER  . HYPERGLYCEMIA  . Urinary incontinence  . Routine general medical examination at a health care facility  . Gynecological examination   Past Medical History  Diagnosis Date  . Chronic sinusitis   . IC (interstitial cystitis)   . Hyperglycemia   . Stress reaction   . Plantar fasciitis   . Trigger finger    Past Surgical History  Procedure Date  . Appendectomy   . Cesarean section    History  Substance Use Topics  . Smoking status: Never Smoker   . Smokeless tobacco: Not on file  . Alcohol Use: No   Family History  Problem Relation Age of Onset  . Anxiety disorder Mother   . Lupus Mother   .  Emphysema Mother   . Coronary artery disease Mother   . Cancer Mother     lung  . Hypertension Mother   . Heart disease Mother   . Cardiomyopathy Father   . Diabetes Father   . Heart disease Father     cardiomyopathy   Allergies  Allergen Reactions  . Penicillins     REACTION: ? allergy  . Pseudoephedrine     REACTION: makes her hyper   Current Outpatient Prescriptions on File Prior to Visit  Medication Sig Dispense Refill  . acetaminophen (TYLENOL) 500 MG tablet Take 500 mg by mouth every 6 (six) hours as needed.             Review of Systems Review of Systems  Constitutional: Negative for fever, appetite change, fatigue and unexpected weight change.  Eyes: Negative for pain and visual disturbance.  Respiratory: Negative for cough and shortness of breath.   Cardiovascular: Negative for cp or palpitations    Gastrointestinal: Negative for nausea, diarrhea and constipation.  Genitourinary: Negative for urgency and frequency.  Skin: Negative for pallor or rash   Neurological: Negative  for weakness, light-headedness, numbness and headaches.  Hematological: Negative for adenopathy. Does not bruise/bleed easily.  Psychiatric/Behavioral: Negative for dysphoric mood. The patient is not nervous/anxious.          Objective:   Physical Exam  Constitutional: She appears well-developed and well-nourished. No distress.  HENT:  Head: Normocephalic and atraumatic.  Right Ear: External ear normal.  Left Ear: External ear normal.  Nose: Nose normal.  Mouth/Throat: Oropharynx is clear and moist.  Eyes: Conjunctivae and EOM are normal. Pupils are equal, round, and reactive to light. No scleral icterus.  Neck: Normal range of motion. Neck supple. No JVD present. Carotid bruit is not present. No thyromegaly present.  Cardiovascular: Normal rate, regular rhythm, normal heart sounds and intact distal pulses.   Pulmonary/Chest: Effort normal and breath sounds normal. No respiratory distress. She has no wheezes.  Abdominal: Soft. Bowel sounds are normal. She exhibits no distension, no abdominal bruit and no mass. There is no tenderness.       No suprapubic tenderness    Genitourinary: Vagina normal and uterus normal. No breast swelling, tenderness, discharge or bleeding. No vaginal discharge found.       No breast masses noted   Mild vaginal atrophy/ dryness   Musculoskeletal: Normal range of motion. She exhibits no edema and no tenderness.  Lymphadenopathy:    She has no cervical adenopathy.  Neurological: She is alert. She has normal reflexes. No cranial nerve deficit. She exhibits normal muscle tone. Coordination normal.  Skin: Skin is warm and dry. No rash noted. No erythema. No pallor.  Psychiatric: She has a normal mood and affect.          Assessment & Plan:

## 2011-04-24 NOTE — Assessment & Plan Note (Signed)
Done without pap today-not due yet Some menopausal vaginal atropy- will try some premarin cream twice weekly and update

## 2011-04-24 NOTE — Assessment & Plan Note (Signed)
This is controlled with diet Lab Results  Component Value Date   HGBA1C 5.6 04/01/2011   urged to continue low glycemic diet and work on [employer]

## 2011-04-24 NOTE — Assessment & Plan Note (Signed)
Reviewed health habits including diet and exercise and skin cancer prevention Also reviewed health mt list, fam hx and immunizations  Rev wellness labs in detail 

## 2011-04-29 ENCOUNTER — Telehealth: Payer: Self-pay | Admitting: Internal Medicine

## 2011-04-29 DIAGNOSIS — R1012 Left upper quadrant pain: Secondary | ICD-10-CM | POA: Insufficient documentation

## 2011-04-29 DIAGNOSIS — R109 Unspecified abdominal pain: Secondary | ICD-10-CM | POA: Insufficient documentation

## 2011-04-29 NOTE — Telephone Encounter (Signed)
I ordered ultrasound Please f/u 1-2 weeks afterwards

## 2011-04-29 NOTE — Telephone Encounter (Signed)
Patient called and stated she still has the Left side pain under her ribs it's a dull pain and wanted to know if you could order a ultrasound.  She is available tomorrow after 2 if we can schedule then.

## 2011-04-29 NOTE — Telephone Encounter (Signed)
Left v/m at all pt's contact # for pt to call back.

## 2011-04-29 NOTE — Telephone Encounter (Signed)
Patient called and stated she was informed if she was still having

## 2011-04-30 NOTE — Telephone Encounter (Signed)
Patient notified as instructed by telephone. Pt said after she hears from pt care coordinator she will schedule f/u appt with Dr Milinda Antis.

## 2011-05-02 ENCOUNTER — Telehealth: Payer: Self-pay

## 2011-05-02 NOTE — Telephone Encounter (Signed)
Pt seen recently and scheduled complete abdominal ultrasound on Mon 05/05/11. At that time pt had pressure feeling lt side.Today pt started with tenderness on  Rt Upper side under ribs. Pt said pain level now is 5.Pt said seen worse after eats. Advised no greasy, spicy food and no carbonated drinks. Pt has no fever, no N&V no diarrhea and no constipation. Pt said what should she do if gets worse over weekend. I gave pt Sat clinic at [facility] #.Please advise. Pt can be reached at [phone]. Pt already has f/u appt with Dr Milinda Antis scheduled 05/12/11.

## 2011-05-02 NOTE — Telephone Encounter (Signed)
Patient notified as instructed by telephone. 

## 2011-05-02 NOTE — Telephone Encounter (Signed)
I agree with advisement - if pain becomes severe at any time - go to ER

## 2011-05-05 ENCOUNTER — Ambulatory Visit
Admission: RE | Admit: 2011-05-05 | Discharge: 2011-05-05 | Disposition: A | Discharge status: Home / Self Care | Payer: BC Managed Care – PPO | Source: Ambulatory Visit | Attending: Family Medicine | Admitting: Family Medicine

## 2011-05-05 DIAGNOSIS — R1012 Left upper quadrant pain: Secondary | ICD-10-CM

## 2011-05-12 ENCOUNTER — Encounter: Payer: Self-pay | Admitting: Family Medicine

## 2011-05-12 ENCOUNTER — Ambulatory Visit (INDEPENDENT_AMBULATORY_CARE_PROVIDER_SITE_OTHER): Payer: BC Managed Care – PPO | Admitting: Family Medicine

## 2011-05-12 VITALS — BP 116/70 | HR 80 | Temp 98.3°F | Ht 63.0 in | Wt 141.2 lb

## 2011-05-12 DIAGNOSIS — K2990 Gastroduodenitis, unspecified, without bleeding: Secondary | ICD-10-CM

## 2011-05-12 DIAGNOSIS — H579 Unspecified disorder of eye and adnexa: Secondary | ICD-10-CM

## 2011-05-12 DIAGNOSIS — H5789 Other specified disorders of eye and adnexa: Secondary | ICD-10-CM

## 2011-05-12 DIAGNOSIS — R1012 Left upper quadrant pain: Secondary | ICD-10-CM

## 2011-05-12 DIAGNOSIS — K2970 Gastritis, unspecified, without bleeding: Secondary | ICD-10-CM | POA: Insufficient documentation

## 2011-05-12 NOTE — Assessment & Plan Note (Signed)
Nl exam  Suspect that she has irritation from new mascara - adv to stop that Adv to update if worse / no imp or swelling or redness

## 2011-05-12 NOTE — Progress Notes (Signed)
Subjective:    Patient ID: Tracy Blankenship, female    DOB: 12-10-1955, 55 y.o.   MRN: [id number]  HPI Here for f/u of abdominal pain  Started on L -was under her ribs- like a fluttering or pressure (never was a sharp pain )   Also eye symptoms -both - worse on L  Started today  Feels scratchy-wonders if giving her pink eye  Also new mascara - ? Allergic   Then pain  went to R- and this made her much more nervous  The night before ate a BBQ sandwhich -- does not usually eat that , and then after a cup of hot chocolate- that was more of a sharp pain- followed by diarrhea   Sister had a lot of GI problems- gastritis and GERD She wonders about that   We did inst her to try some zantac- has not done that yet  Wanted to try lifestyle change first  She did unfortunately drink a few cups of coffee - and that was the beginning  Cut way down on tea Cut portions  Bland food Cut carbonation No nsaids as a rule  No OJ or lemonade occ tomato  Symptoms have started to improve   Had abd [place] -totally nl  Is very reassured by all findings   Patient Active Problem List  Diagnoses  . STRESS REACTION, ACUTE  . ALLERGIC RHINITIS  . INTERSTITIAL CYSTITIS  . MENOPAUSAL DISORDER  . HYPERGLYCEMIA  . Urinary incontinence  . Routine general medical examination at a health care facility  . Gynecological examination  . Abdominal pain, left upper quadrant  . Gastritis  . Eye irritation   Past Medical History  Diagnosis Date  . Chronic sinusitis   . IC (interstitial cystitis)   . Hyperglycemia   . Stress reaction   . Plantar fasciitis   . Trigger finger    Past Surgical History  Procedure Date  . Appendectomy   . Cesarean section    History  Substance Use Topics  . Smoking status: Never Smoker   . Smokeless tobacco: Not on file  . Alcohol Use: No   Family History  Problem Relation Age of Onset  . Anxiety disorder Mother   . Lupus Mother   . Emphysema Mother   . Coronary  artery disease Mother   . Cancer Mother     lung  . Hypertension Mother   . Heart disease Mother   . Cardiomyopathy Father   . Diabetes Father   . Heart disease Father     cardiomyopathy   Allergies  Allergen Reactions  . Penicillins     REACTION: ? allergy  . Pseudoephedrine     REACTION: makes her hyper   Current Outpatient Prescriptions on File Prior to Visit  Medication Sig Dispense Refill  . mometasone (NASONEX) 50 MCG/ACT nasal spray Place 1 spray into the nose daily.  17 g  11  . acetaminophen (TYLENOL) 500 MG tablet Take 500 mg by mouth every 6 (six) hours as needed.        . conjugated estrogens (PREMARIN) vaginal cream Insert 1-2 cm of cream in applicator vaginally twice weekly  30 g  2       Review of Systems Review of Systems  Constitutional: Negative for fever, appetite change, fatigue and unexpected weight change.  Eyes: Negative for pain and visual disturbance. pos for eye itching and irritation  Respiratory: Negative for cough and shortness of breath.   Cardiovascular: Negative for cp  or palpitations    Gastrointestinal: Negative for nausea, diarrhea and constipation. pos for indigestion and abd pain that is improving  Genitourinary: Negative for urgency and frequency.  Skin: Negative for pallor or rash   Neurological: Negative for weakness, light-headedness, numbness and headaches.  Hematological: Negative for adenopathy. Does not bruise/bleed easily.  Psychiatric/Behavioral: Negative for dysphoric mood. The patient is not nervous/anxious.          Objective:   Physical Exam  Constitutional: She appears well-developed and well-nourished. No distress.  HENT:  Head: Normocephalic and atraumatic.  Mouth/Throat: Oropharynx is clear and moist.  Eyes: Conjunctivae and EOM are normal. Pupils are equal, round, and reactive to light. Right eye exhibits no discharge. Left eye exhibits no discharge. No scleral icterus.  Neck: Normal range of motion. Neck supple.  No JVD present. Carotid bruit is not present. No thyromegaly present.  Cardiovascular: Normal rate, regular rhythm, normal heart sounds and intact distal pulses.  Exam reveals no gallop.   Pulmonary/Chest: Effort normal and breath sounds normal. No respiratory distress. She has no wheezes.  Abdominal: Soft. Bowel sounds are normal. She exhibits no distension and no mass. There is no tenderness. There is no rebound and no guarding.  Musculoskeletal: Normal range of motion. She exhibits no edema.  Lymphadenopathy:    She has no cervical adenopathy.  Neurological: She is alert. She has normal reflexes.  Skin: Skin is warm and dry. No rash noted. No erythema. No pallor.  Psychiatric: She has a normal mood and affect.          Assessment & Plan:

## 2011-05-12 NOTE — Patient Instructions (Addendum)
Take the zantac 150mg  twice daily for 2 weeks  Follow low acid/ caffeine diet and watch portions  If symptoms return- let me know  Ultrasound is reassuring  For eye irritation - switch brands of mascara  You can use lubricant eye drops over the counter

## 2011-05-12 NOTE — Assessment & Plan Note (Signed)
Suspect this is reason for abd pain - which has changed locations  Adv trial of zantac 150 bid for 2 weeks Also continued lifestyle change which has already helped  Rev diet in detail  F/u 3 months - earlier if not improved

## 2011-05-12 NOTE — Assessment & Plan Note (Signed)
Suspect gastritis - see further assessment

## 2011-07-15 ENCOUNTER — Other Ambulatory Visit: Payer: Self-pay | Admitting: Family Medicine

## 2011-07-15 DIAGNOSIS — Z1231 Encounter for screening mammogram for malignant neoplasm of breast: Secondary | ICD-10-CM

## 2011-08-01 ENCOUNTER — Ambulatory Visit
Admission: RE | Admit: 2011-08-01 | Discharge: 2011-08-01 | Disposition: A | Discharge status: Home / Self Care | Payer: BC Managed Care – PPO | Source: Ambulatory Visit | Attending: Family Medicine | Admitting: Family Medicine

## 2011-08-01 DIAGNOSIS — Z1231 Encounter for screening mammogram for malignant neoplasm of breast: Secondary | ICD-10-CM

## 2011-08-07 ENCOUNTER — Encounter: Payer: Self-pay | Admitting: *Deleted

## 2012-01-23 ENCOUNTER — Ambulatory Visit (INDEPENDENT_AMBULATORY_CARE_PROVIDER_SITE_OTHER): Payer: BC Managed Care – PPO | Admitting: Family Medicine

## 2012-01-23 ENCOUNTER — Encounter: Payer: Self-pay | Admitting: Family Medicine

## 2012-01-23 VITALS — BP 122/80 | HR 94 | Temp 97.9°F | Ht 63.0 in | Wt 145.2 lb

## 2012-01-23 DIAGNOSIS — K2990 Gastroduodenitis, unspecified, without bleeding: Secondary | ICD-10-CM

## 2012-01-23 DIAGNOSIS — R194 Change in bowel habit: Secondary | ICD-10-CM | POA: Insufficient documentation

## 2012-01-23 DIAGNOSIS — K2970 Gastritis, unspecified, without bleeding: Secondary | ICD-10-CM

## 2012-01-23 DIAGNOSIS — R198 Other specified symptoms and signs involving the digestive system and abdomen: Secondary | ICD-10-CM

## 2012-01-23 MED ORDER — RANITIDINE HCL 150 MG PO TABS: 150.0000 mg | ORAL_TABLET | Freq: Two times a day (BID) | ORAL | Status: DC

## 2012-01-23 NOTE — Progress Notes (Signed)
Subjective:    Patient ID: Tracy Blankenship, female    DOB: 06/18/1955, 56 y.o.   MRN: [id number]  HPI Here for abd pain Has retired - happy about that   Has had problems with gastritis in the past  Has really tried to eat better No fried foods/ no beef/ no spicy foods / avoid peppermint   Has taken zantac once in a while - only when she really needs it   epigastic area- full and bloated  Intermittent constipation and loose stool  No blood or dark stools   Eating generally makes it worse   Pain is mild but persistant- feels tight/ dull pain  Last Monday- went to a banquet - that really set it off   Has not had a colonoscopy yet    Now no longer eating biscuit for bkfast-eating cereal  More nuts at lunch too    Patient Active Problem List  Diagnosis  . STRESS REACTION, ACUTE  . ALLERGIC RHINITIS  . INTERSTITIAL CYSTITIS  . MENOPAUSAL DISORDER  . HYPERGLYCEMIA  . Urinary incontinence  . Routine general medical examination at a health care facility  . Gynecological examination  . Abdominal pain, left upper quadrant  . Gastritis  . Eye irritation   Past Medical History  Diagnosis Date  . Chronic sinusitis   . IC (interstitial cystitis)   . Hyperglycemia   . Stress reaction   . Plantar fasciitis   . Trigger finger    Past Surgical History  Procedure Date  . Appendectomy   . Cesarean section    History  Substance Use Topics  . Smoking status: Never Smoker   . Smokeless tobacco: Not on file  . Alcohol Use: No   Family History  Problem Relation Age of Onset  . Anxiety disorder Mother   . Lupus Mother   . Emphysema Mother   . Coronary artery disease Mother   . Cancer Mother     lung  . Hypertension Mother   . Heart disease Mother   . Cardiomyopathy Father   . Diabetes Father   . Heart disease Father     cardiomyopathy   Allergies  Allergen Reactions  . Penicillins     REACTION: ? allergy  . Pseudoephedrine     REACTION: makes her hyper    Current Outpatient Prescriptions on File Prior to Visit  Medication Sig Dispense Refill  . acetaminophen (TYLENOL) 500 MG tablet Take 500 mg by mouth every 6 (six) hours as needed.        . mometasone (NASONEX) 50 MCG/ACT nasal spray Place 1 spray into the nose daily.  17 g  11  . conjugated estrogens (PREMARIN) vaginal cream Insert 1-2 cm of cream in applicator vaginally twice weekly  30 g  2      Review of Systems   Review of Systems  Constitutional: Negative for fever, appetite change, fatigue and unexpected weight change.  Eyes: Negative for pain and visual disturbance.  Respiratory: Negative for cough and shortness of breath.   Cardiovascular: Negative for cp or palpitations    Gastrointestinal: Negative for nausea,vomiting, neg for blood in stool  Genitourinary: Negative for urgency and frequency.  Skin: Negative for pallor or rash   Neurological: Negative for weakness, light-headedness, numbness and headaches.  Hematological: Negative for adenopathy. Does not bruise/bleed easily.  Psychiatric/Behavioral: Negative for dysphoric mood. The patient is not nervous/anxious.      Objective:   Physical Exam  Constitutional: She appears well-developed and well-nourished.  No distress.  HENT:  Head: Normocephalic and atraumatic.  Mouth/Throat: Oropharynx is clear and moist.  Eyes: Conjunctivae and EOM are normal. Pupils are equal, round, and reactive to light. No scleral icterus.  Neck: Normal range of motion. Neck supple. No thyromegaly present.  Cardiovascular: Normal rate, regular rhythm and normal heart sounds.   Pulmonary/Chest: Effort normal and breath sounds normal. No respiratory distress. She has no wheezes. She has no rales.  Abdominal: Soft. Bowel sounds are normal. She exhibits no distension and no mass. There is tenderness. There is no rebound and no guarding.       Mild epigastric tenderness without rebound or gaurding    Musculoskeletal: She exhibits no edema.   Lymphadenopathy:    She has no cervical adenopathy.  Neurological: She is alert. She has normal reflexes.  Skin: Skin is warm and dry. No erythema. No pallor.  Psychiatric: She has a normal mood and affect.          Assessment & Plan:

## 2012-01-23 NOTE — Patient Instructions (Addendum)
You may have irritable bowel syndrome Keep a food journal  Try citrucel fiber supplement daily  Take zantac 150 mg twice daily  We will do GI referral at check out

## 2012-01-23 NOTE — Assessment & Plan Note (Signed)
Will get back on zantac Diet is good  Ref to GI for ongoing stool issues also Will let me know if no improvement

## 2012-01-23 NOTE — Assessment & Plan Note (Signed)
Sounds like ibs Will keep diet journal  Is due for screen colonosc Will try citrucel Ref to GI

## 2012-02-17 ENCOUNTER — Encounter: Payer: Self-pay | Admitting: Internal Medicine

## 2012-02-18 ENCOUNTER — Ambulatory Visit (INDEPENDENT_AMBULATORY_CARE_PROVIDER_SITE_OTHER): Payer: BC Managed Care – PPO | Admitting: Internal Medicine

## 2012-02-18 ENCOUNTER — Encounter: Payer: Self-pay | Admitting: Internal Medicine

## 2012-02-18 ENCOUNTER — Other Ambulatory Visit (INDEPENDENT_AMBULATORY_CARE_PROVIDER_SITE_OTHER): Payer: BC Managed Care – PPO

## 2012-02-18 VITALS — BP 118/82 | HR 84 | Ht 63.25 in | Wt 143.4 lb

## 2012-02-18 DIAGNOSIS — R109 Unspecified abdominal pain: Secondary | ICD-10-CM

## 2012-02-18 DIAGNOSIS — R198 Other specified symptoms and signs involving the digestive system and abdomen: Secondary | ICD-10-CM

## 2012-02-18 DIAGNOSIS — R194 Change in bowel habit: Secondary | ICD-10-CM

## 2012-02-18 DIAGNOSIS — Z1211 Encounter for screening for malignant neoplasm of colon: Secondary | ICD-10-CM

## 2012-02-18 DIAGNOSIS — R1031 Right lower quadrant pain: Secondary | ICD-10-CM

## 2012-02-18 MED ORDER — PEG-KCL-NACL-NASULF-NA ASC-C 100 G PO SOLR: 1.0000 | Freq: Once | ORAL | Status: DC

## 2012-02-18 MED ORDER — HYOSCYAMINE SULFATE 0.125 MG SL SUBL: 0.1250 mg | SUBLINGUAL_TABLET | SUBLINGUAL | Status: DC | PRN

## 2012-02-18 NOTE — Progress Notes (Signed)
Patient ID: Tracy Blankenship, female   DOB: 1955-11-06, 56 y.o.   MRN: [id number]  SUBJECTIVE: HPI Tracy Blankenship is a 56 yo female with PMH of chronic sinusitis, interstitial cystitis, and mild hyperglycemia without a diagnosis of diabetes who seen in consultation at the request of Dr. Milinda Antis for evaluation of change in bowel habits and for consideration of screening colonoscopy.  Patient reports having a change in her bowel pattern over the last year. This primarily for her is mid and lower abdominal cramping relieved by bowel movement. She also is having days where her bowel movements are more frequent, but they have not changed in consistency. She denies diarrhea or constipation. There are days were she'll have 3 or 4 formed brown stools daily, and then she will have a day without a bowel movement.  She denies blood in her stool and melena. She does experience some abdominal bloating but no increased belching. She denies heartburn. No nausea or vomiting. Good appetite without weight loss. She does occasionally report left upper quadrant or "side" pain which is noticed at night when she rolls onto her left side. This pain is not present in the day were related to eating or bowel movement. She's never had a colonoscopy but is not opposed to one. She has tried ranitidine 150 mg twice daily, but is not taking this on a regular basis. She has not found it to have a big impact on her abdominal symptoms. No fevers or chills. She does have some night sweats which she relates to menopause.  Review of Systems  As per history of present illness, otherwise negative   Past Medical History  Diagnosis Date  . Chronic sinusitis   . IC (interstitial cystitis)   . Hyperglycemia   . Stress reaction   . Plantar fasciitis   . Trigger finger     Current Outpatient Prescriptions  Medication Sig Dispense Refill  . mometasone (NASONEX) 50 MCG/ACT nasal spray Place 1 spray into the nose daily.  17 g  11  . ranitidine  (ZANTAC) 150 MG tablet Take 1 tablet (150 mg total) by mouth 2 (two) times daily.  60 tablet  11  . acetaminophen (TYLENOL) 500 MG tablet Take 500 mg by mouth every 6 (six) hours as needed.        . hyoscyamine (LEVSIN/SL) 0.125 MG SL tablet Place 1 tablet (0.125 mg total) under the tongue every 4 (four) hours as needed for cramping.  30 tablet  0  . peg 3350 powder (MOVIPREP) 100 G SOLR Take 1 kit (100 g total) by mouth once.  1 kit  0    Allergies  Allergen Reactions  . Penicillins     REACTION: ? allergy  . Pseudoephedrine     REACTION: makes her hyper    Family History  Problem Relation Age of Onset  . Anxiety disorder Mother   . Lupus Mother   . Emphysema Mother   . Coronary artery disease Mother   . Lung cancer Mother   . Hypertension Mother   . Heart disease Mother   . Cardiomyopathy Father   . Diabetes Father   . Stroke Father   . COPD Mother   . Kidney disease Mother     History  Substance Use Topics  . Smoking status: Never Smoker   . Smokeless tobacco: Never Used  . Alcohol Use: Yes     wine -every 3-4 months    OBJECTIVE: BP 118/82  Pulse 84  Ht 5'  3.25" (1.607 m)  Wt 143 lb 6 oz (65.034 kg)  BMI 25.20 kg/m2 Constitutional: Well-developed and well-nourished. No distress. HEENT: Normocephalic and atraumatic. Oropharynx is clear and moist. No oropharyngeal exudate. Conjunctivae are normal. Pupils are equal round and reactive to light. No scleral icterus. Neck: Neck supple. Trachea midline. Cardiovascular: Normal rate, regular rhythm and intact distal pulses. No M/R/G Pulmonary/chest: Effort normal and breath sounds normal. No wheezing, rales or rhonchi. Abdominal: Soft, nontender, nondistended. Bowel sounds active throughout. There are no masses palpable. No hepatosplenomegaly. Extremities: no clubbing, cyanosis, or edema Lymphadenopathy: No cervical adenopathy noted. Neurological: Alert and oriented to person place and time. Skin: Skin is warm and dry.  No rashes noted. Psychiatric: Normal mood and affect. Behavior is normal.  Labs and Imaging -- CBC    Component Value Date/Time   WBC 3.4* 04/01/2011 0818   RBC 4.61 04/01/2011 0818   HGB 13.9 04/01/2011 0818   HCT 41.0 04/01/2011 0818   PLT 233.0 04/01/2011 0818   MCV 89.0 04/01/2011 0818   MCHC 33.9 04/01/2011 0818   RDW 12.9 04/01/2011 0818   LYMPHSABS 1.3 04/01/2011 0818   MONOABS 0.3 04/01/2011 0818   EOSABS 0.1 04/01/2011 0818   BASOSABS 0.0 04/01/2011 0818    CMP     Component Value Date/Time   NA 140 04/01/2011 0818   K 3.9 04/01/2011 0818   CL 105 04/01/2011 0818   CO2 29 04/01/2011 0818   GLUCOSE 88 04/01/2011 0818   BUN 16 04/01/2011 0818   CREATININE 0.8 04/01/2011 0818   CALCIUM 9.4 04/01/2011 0818   PROT 7.1 04/01/2011 0818   ALBUMIN 4.2 04/01/2011 0818   AST 18 04/01/2011 0818   ALT 14 04/01/2011 0818   ALKPHOS 60 04/01/2011 0818   BILITOT 0.7 04/01/2011 0818   GFRNONAA 82.82 12/17/2009 0819    She reports she just had a battery of labs performed for an insurance physical and was told they were all normal. They are scanned in the chart under the media tab, and I reviewed them.  ASSESSMENT AND PLAN: 56 yo female with PMH of chronic sinusitis, interstitial cystitis, and mild hyperglycemia without a diagnosis of diabetes who seen in consultation at the request of Dr. Milinda Antis for evaluation of change in bowel habits and for consideration of screening colonoscopy.  1.  Change in bowel pattern/lower abd cramping --  Her symptoms seem somewhat irritable, and may be related to her lifestyle change and dietary change associated with her retirement.  I have recommended colonoscopy given her age for colorectal cancer screening. We discussed this test today including the risks and benefits and she is agreeable to proceed. Also check a celiac panel given her symptoms. I would like her to try Align one capsule daily as a probiotic to help regulate her digestion. I will give her prescription for  Levsin to be used as needed and as directed for abdominal pain/spasm. She has been keeping a food diary and at this point has not been able to identify any specific trigger food. It does not seem that she is lactose intolerant. At this time she's not having much in the way of upper GI symptoms or heartburn, and I've advised that she can continue the ranitidine when necessary, but should not need it on a scheduled basis  Further recommendations after labs and procedure

## 2012-02-18 NOTE — Patient Instructions (Addendum)
You have been scheduled for a colonoscopy with propofol. Please follow written instructions given to you at your visit today.  Please pick up your prep kit at the pharmacy within the next 1-3 days. If you use inhalers (even only as needed), please bring them with you on the day of your procedure.   We have sent the following medications to your pharmacy for you to pick up at your convenience: levsin, moviprep, take Zantac as needed  Your physician has requested that you go to the basement for the following lab work before leaving today: Celiac panel  You have been given samples of Align.  Take on capsule daily

## 2012-02-26 ENCOUNTER — Encounter: Payer: BC Managed Care – PPO | Admitting: Internal Medicine

## 2012-03-18 ENCOUNTER — Encounter: Payer: BC Managed Care – PPO | Admitting: Internal Medicine

## 2012-04-12 ENCOUNTER — Other Ambulatory Visit (INDEPENDENT_AMBULATORY_CARE_PROVIDER_SITE_OTHER): Payer: BC Managed Care – PPO

## 2012-04-12 ENCOUNTER — Telehealth: Payer: Self-pay | Admitting: Family Medicine

## 2012-04-12 DIAGNOSIS — Z0000 Encounter for general adult medical examination without abnormal findings: Secondary | ICD-10-CM

## 2012-04-12 DIAGNOSIS — R7309 Other abnormal glucose: Secondary | ICD-10-CM

## 2012-04-12 LAB — COMPREHENSIVE METABOLIC PANEL
ALT: 21 U/L (ref 0–35)
AST: 25 U/L (ref 0–37)
Albumin: 4.3 g/dL (ref 3.5–5.2)
Alkaline Phosphatase: 61 U/L (ref 39–117)
Calcium: 9.2 mg/dL (ref 8.4–10.5)
Chloride: 105 mEq/L (ref 96–112)
Creatinine, Ser: 0.8 mg/dL (ref 0.4–1.2)
Potassium: 3.9 mEq/L (ref 3.5–5.1)

## 2012-04-12 LAB — CBC WITH DIFFERENTIAL/PLATELET
Basophils Absolute: 0 10*3/uL (ref 0.0–0.1)
Eosinophils Absolute: 0.1 10*3/uL (ref 0.0–0.7)
Lymphocytes Relative: 38.8 % (ref 12.0–46.0)
MCHC: 33 g/dL (ref 30.0–36.0)
Monocytes Absolute: 0.2 10*3/uL (ref 0.1–1.0)
Neutrophils Relative %: 51.7 % (ref 43.0–77.0)
RDW: 13.4 % (ref 11.5–14.6)

## 2012-04-12 LAB — LIPID PANEL
LDL Cholesterol: 125 mg/dL — ABNORMAL HIGH (ref 0–99)
Total CHOL/HDL Ratio: 3
Triglycerides: 85 mg/dL (ref 0.0–149.0)
VLDL: 17 mg/dL (ref 0.0–40.0)

## 2012-04-12 NOTE — Telephone Encounter (Signed)
Message copied by Judy Pimple on Mon Apr 12, 2012  8:53 AM ------      Message from: Alvina Chou      Created: Tue Apr 06, 2012  3:53 PM      Regarding: lab orders for Thursday, 11.21.13       Patient is scheduled for CPX labs, please order future labs, Thanks , Camelia Eng

## 2012-04-15 ENCOUNTER — Other Ambulatory Visit: Payer: BC Managed Care – PPO

## 2012-04-23 ENCOUNTER — Encounter: Payer: BC Managed Care – PPO | Admitting: Family Medicine

## 2012-04-27 ENCOUNTER — Other Ambulatory Visit (HOSPITAL_COMMUNITY)
Admission: RE | Admit: 2012-04-27 | Discharge: 2012-04-27 | Disposition: A | Discharge status: Home / Self Care | Payer: BC Managed Care – PPO | Source: Ambulatory Visit | Attending: Family Medicine | Admitting: Family Medicine

## 2012-04-27 ENCOUNTER — Ambulatory Visit (INDEPENDENT_AMBULATORY_CARE_PROVIDER_SITE_OTHER): Payer: BC Managed Care – PPO | Admitting: Family Medicine

## 2012-04-27 ENCOUNTER — Encounter: Payer: Self-pay | Admitting: Family Medicine

## 2012-04-27 VITALS — BP 118/78 | HR 77 | Temp 98.7°F | Ht 63.5 in | Wt 145.0 lb

## 2012-04-27 DIAGNOSIS — Z01419 Encounter for gynecological examination (general) (routine) without abnormal findings: Secondary | ICD-10-CM

## 2012-04-27 DIAGNOSIS — Z0000 Encounter for general adult medical examination without abnormal findings: Secondary | ICD-10-CM

## 2012-04-27 DIAGNOSIS — Z1151 Encounter for screening for human papillomavirus (HPV): Secondary | ICD-10-CM | POA: Insufficient documentation

## 2012-04-27 DIAGNOSIS — R7309 Other abnormal glucose: Secondary | ICD-10-CM

## 2012-04-27 DIAGNOSIS — Z23 Encounter for immunization: Secondary | ICD-10-CM

## 2012-04-27 MED ORDER — MOMETASONE FUROATE 50 MCG/ACT NA SUSP: 1.0000 | Freq: Every day | NASAL | Status: DC

## 2012-04-27 NOTE — Progress Notes (Signed)
Subjective:    Patient ID: Tracy Blankenship, female    DOB: 27-Jul-1955, 56 y.o.   MRN: [id number]  HPI Here for health maintenance exam and to review chronic medical problems    Is doing well  Has a grandchild on the way  Needs Tdap vaccine  Needs flu vaccine also  Is interested in shingles vaccine if ins covers it    Wt is up 2 lb with bmi of 25 She wants to weigh less    Pap 7/10 Time for her 3 year pap  Mammogram 3/13 Self exam-no lumps   Colon cancer screening Saw Dr Rhea Belton - GI Had neg celiac test  Is scheduled to have her colonoscopy next week   Mood - is overall good    Lab Results  Component Value Date   CHOL 200 04/12/2012   CHOL 184 04/01/2011   CHOL 206* 12/17/2009   Lab Results  Component Value Date   HDL 58.00 04/12/2012   HDL 64.20 04/01/2011   HDL 25.36 12/17/2009   Lab Results  Component Value Date   LDLCALC 125* 04/12/2012   LDLCALC 107* 04/01/2011   LDLCALC 113* 11/30/2008   Lab Results  Component Value Date   TRIG 85.0 04/12/2012   TRIG 62.0 04/01/2011   TRIG 91.0 12/17/2009   Lab Results  Component Value Date   CHOLHDL 3 04/12/2012   CHOLHDL 3 04/01/2011   CHOLHDL 3 12/17/2009   Lab Results  Component Value Date   LDLDIRECT 126.5 12/17/2009   cholesterol is up a bit - but not a lot  Is eating more regularly now - and eating a bit more  Has cut out biscuts , eating more nuts and trail mix and fiber   Hyperglycemia Lab Results  Component Value Date   HGBA1C 5.5 04/12/2012     Patient Active Problem List  Diagnosis  . STRESS REACTION, ACUTE  . ALLERGIC RHINITIS  . INTERSTITIAL CYSTITIS  . MENOPAUSAL DISORDER  . HYPERGLYCEMIA  . Urinary incontinence  . Routine general medical examination at a health care facility  . Gynecological examination  . Abdominal pain, left upper quadrant  . Gastritis  . Eye irritation  . Change in stool habits   Past Medical History  Diagnosis Date  . Chronic sinusitis   . IC (interstitial  cystitis)   . Hyperglycemia   . Stress reaction   . Plantar fasciitis   . Trigger finger    Past Surgical History  Procedure Date  . Appendectomy   . Cesarean section     X 2   History  Substance Use Topics  . Smoking status: Never Smoker   . Smokeless tobacco: Never Used  . Alcohol Use: Yes     Comment: wine -every 3-4 months   Family History  Problem Relation Age of Onset  . Anxiety disorder Mother   . Lupus Mother   . Emphysema Mother   . Coronary artery disease Mother   . Lung cancer Mother   . Hypertension Mother   . Heart disease Mother   . Cardiomyopathy Father   . Diabetes Father   . Stroke Father   . COPD Mother   . Kidney disease Mother    Allergies  Allergen Reactions  . Penicillins     REACTION: ? allergy  . Pseudoephedrine     REACTION: makes her hyper   Current Outpatient Prescriptions on File Prior to Visit  Medication Sig Dispense Refill  . acetaminophen (TYLENOL) 500 MG tablet  Take 500 mg by mouth every 6 (six) hours as needed.        . mometasone (NASONEX) 50 MCG/ACT nasal spray Place 1 spray into the nose daily.  17 g  11  . [DISCONTINUED] ranitidine (ZANTAC) 150 MG tablet Take 1 tablet (150 mg total) by mouth 2 (two) times daily.  60 tablet  11  . hyoscyamine (LEVSIN/SL) 0.125 MG SL tablet Place 1 tablet (0.125 mg total) under the tongue every 4 (four) hours as needed for cramping.  30 tablet  0  . peg 3350 powder (MOVIPREP) 100 G SOLR Take 1 kit (100 g total) by mouth once.  1 kit  0    Review of Systems Review of Systems  Constitutional: Negative for fever, appetite change, fatigue and unexpected weight change.  Eyes: Negative for pain and visual disturbance.  Respiratory: Negative for cough and shortness of breath.   Cardiovascular: Negative for cp or palpitations    Gastrointestinal: Negative for nausea, diarrhea and constipation. pos for bloating/ abd pain intermittent Genitourinary: Negative for urgency and frequency.  Skin: Negative  for pallor or rash   Neurological: Negative for weakness, light-headedness, numbness and headaches.  Hematological: Negative for adenopathy. Does not bruise/bleed easily.  Psychiatric/Behavioral: Negative for dysphoric mood. The patient is not nervous/anxious.         Objective:   Physical Exam  Constitutional: She is oriented to person, place, and time. She appears well-developed and well-nourished. No distress.  HENT:  Head: Normocephalic and atraumatic.  Right Ear: External ear normal.  Left Ear: External ear normal.  Nose: Nose normal.  Mouth/Throat: Oropharynx is clear and moist.  Eyes: Conjunctivae normal and EOM are normal. Pupils are equal, round, and reactive to light.  Neck: Normal range of motion. Neck supple. No JVD present. Carotid bruit is not present. No thyromegaly present.  Cardiovascular: Normal rate, regular rhythm, normal heart sounds and intact distal pulses.  Exam reveals no gallop.   Pulmonary/Chest: Effort normal and breath sounds normal. No respiratory distress. She has no wheezes.  Abdominal: Soft. Bowel sounds are normal. She exhibits no distension and no mass. There is no tenderness.  Genitourinary: Vagina normal and uterus normal. No breast swelling, tenderness, discharge or bleeding. There is no rash, tenderness or lesion on the right labia. There is no rash, tenderness or lesion on the left labia. Uterus is not enlarged and not tender. Cervix exhibits no motion tenderness, no discharge and no friability. Right adnexum displays no mass, no tenderness and no fullness. Left adnexum displays no mass and no tenderness. No bleeding around the vagina. No vaginal discharge found.       Breast exam: No mass, nodules, thickening, tenderness, bulging, retraction, inflamation, nipple discharge or skin changes noted.  No axillary or clavicular LA.  Chaperoned exam.    Musculoskeletal: Normal range of motion. She exhibits no edema and no tenderness.  Lymphadenopathy:    She  has no cervical adenopathy.  Neurological: She is alert and oriented to person, place, and time. She has normal reflexes. No cranial nerve deficit. She exhibits normal muscle tone. Coordination normal.  Skin: Skin is warm and dry. No rash noted. No erythema. No pallor.  Psychiatric: She has a normal mood and affect.          Assessment & Plan:

## 2012-04-27 NOTE — Patient Instructions (Addendum)
Avoid red meat/ fried foods/ egg yolks/ fatty breakfast meats/ butter, cheese and high fat dairy/ and shellfish  Tdap vaccine today  Flu vaccine today Do get your colonoscopy Your mammogram will be due in the spring Take care of yourself

## 2012-04-29 NOTE — Assessment & Plan Note (Signed)
Routine pap and gyn exam No complaints or problems

## 2012-04-29 NOTE — Assessment & Plan Note (Signed)
a1c remains below 6 Good diet  Urged to mt her wt and eat low glycemic diet  Will continue to follow

## 2012-04-29 NOTE — Assessment & Plan Note (Signed)
Reviewed health habits including diet and exercise and skin cancer prevention Also reviewed health mt list, fam hx and immunizations  Reviewed wellness labs in detail  Rev low sat fat diet

## 2012-05-03 ENCOUNTER — Encounter: Payer: Self-pay | Admitting: *Deleted

## 2012-05-04 ENCOUNTER — Encounter: Payer: BC Managed Care – PPO | Admitting: Internal Medicine

## 2012-06-01 ENCOUNTER — Telehealth: Payer: Self-pay | Admitting: Family Medicine

## 2012-06-01 ENCOUNTER — Ambulatory Visit (INDEPENDENT_AMBULATORY_CARE_PROVIDER_SITE_OTHER)
Admission: RE | Admit: 2012-06-01 | Discharge: 2012-06-01 | Disposition: A | Discharge status: Home / Self Care | Payer: BC Managed Care – PPO | Source: Ambulatory Visit | Attending: Family Medicine | Admitting: Family Medicine

## 2012-06-01 ENCOUNTER — Encounter: Payer: Self-pay | Admitting: Family Medicine

## 2012-06-01 ENCOUNTER — Other Ambulatory Visit: Payer: Self-pay | Admitting: *Deleted

## 2012-06-01 ENCOUNTER — Ambulatory Visit (INDEPENDENT_AMBULATORY_CARE_PROVIDER_SITE_OTHER): Payer: BC Managed Care – PPO | Admitting: Family Medicine

## 2012-06-01 VITALS — BP 128/74 | HR 84 | Temp 98.6°F | Ht 63.5 in | Wt 147.0 lb

## 2012-06-01 DIAGNOSIS — R071 Chest pain on breathing: Secondary | ICD-10-CM

## 2012-06-01 DIAGNOSIS — R0789 Other chest pain: Secondary | ICD-10-CM | POA: Insufficient documentation

## 2012-06-01 DIAGNOSIS — R911 Solitary pulmonary nodule: Secondary | ICD-10-CM | POA: Insufficient documentation

## 2012-06-01 DIAGNOSIS — R109 Unspecified abdominal pain: Secondary | ICD-10-CM

## 2012-06-01 LAB — POCT URINALYSIS DIPSTICK
Bilirubin, UA: NEGATIVE
Ketones, UA: NEGATIVE
pH, UA: 6

## 2012-06-01 LAB — POCT UA - MICROSCOPIC ONLY

## 2012-06-01 NOTE — Assessment & Plan Note (Addendum)
Suspect rib/ MSK related ua did show some wbc-sent for cx as well- but no urinary sympotms

## 2012-06-01 NOTE — Telephone Encounter (Signed)
Opened in error

## 2012-06-01 NOTE — Telephone Encounter (Signed)
Message copied by Judy Pimple on Tue Jun 01, 2012  4:40 PM ------      Message from: Shon Millet      Created: Tue Jun 01, 2012  3:48 PM       Notified of xray results and U/A results, pt agrees with referral for CT      [phone] is best phone # to reach pt @)

## 2012-06-01 NOTE — Progress Notes (Signed)
Subjective:    Patient ID: Tracy Blankenship, female    DOB: 1955-06-22, 57 y.o.   MRN: [id number]  HPI Here for abdominal pain  She has had this in the past - in the LUQ and side/ flank  Is a discomfort more than a pain  Is intermittent  Worse if she rolls on that side during the night , or if she leans back in the car (some positional component) Eating makes no difference It feels like a rib problem  No pain to take a deep breath  No urinary symptoms at all   Is a bit more tired   Is not exercising - but does a lot of housework  This has been going on for mo abd [place] was nl in the past    Has a new grandchild   Has colonosc planned next week   Patient Active Problem List  Diagnosis  . STRESS REACTION, ACUTE  . ALLERGIC RHINITIS  . INTERSTITIAL CYSTITIS  . MENOPAUSAL DISORDER  . HYPERGLYCEMIA  . Urinary incontinence  . Routine general medical examination at a health care facility  . Routine gynecological examination  . Abdominal pain, left upper quadrant  . Eye irritation  . Change in stool habits  . Left flank pain  . Chest wall pain  . Pulmonary nodule, right   Past Medical History  Diagnosis Date  . Chronic sinusitis   . IC (interstitial cystitis)   . Hyperglycemia   . Stress reaction   . Plantar fasciitis   . Trigger finger    Past Surgical History  Procedure Date  . Appendectomy   . Cesarean section     X 2   History  Substance Use Topics  . Smoking status: Never Smoker   . Smokeless tobacco: Never Used  . Alcohol Use: Yes     Comment: wine -every 3-4 months   Family History  Problem Relation Age of Onset  . Anxiety disorder Mother   . Lupus Mother   . Emphysema Mother   . Coronary artery disease Mother   . Lung cancer Mother   . Hypertension Mother   . Heart disease Mother   . COPD Mother   . Kidney disease Mother   . Cardiomyopathy Father   . Diabetes Father   . Stroke Father   . Colon cancer Neg Hx    Allergies  Allergen  Reactions  . Penicillins     REACTION: ? allergy  . Pseudoephedrine     REACTION: makes her hyper   Current Outpatient Prescriptions on File Prior to Visit  Medication Sig Dispense Refill  . acetaminophen (TYLENOL) 500 MG tablet Take 500 mg by mouth every 6 (six) hours as needed.        . mometasone (NASONEX) 50 MCG/ACT nasal spray Place 1 spray into the nose daily.  17 g  11  . ranitidine (ZANTAC) 150 MG tablet Take 150 mg by mouth as needed.      . hyoscyamine (LEVSIN/SL) 0.125 MG SL tablet Place 1 tablet (0.125 mg total) under the tongue every 4 (four) hours as needed for cramping.  30 tablet  0  . peg 3350 powder (MOVIPREP) 100 G SOLR Take 1 kit (100 g total) by mouth once.  1 kit  0    Review of Systems    Review of Systems  Constitutional: Negative for fever, appetite change, fatigue and unexpected weight change.  Eyes: Negative for pain and visual disturbance.  Respiratory: Negative for cough  and shortness of breath.   Cardiovascular: Negative for  palpitations   neg for PND or orthopnea  Gastrointestinal: Negative for nausea, diarrhea and constipation.  Genitourinary: Negative for urgency and frequency.  Skin: Negative for pallor or rash   Neurological: Negative for weakness, light-headedness, numbness and headaches.  Hematological: Negative for adenopathy. Does not bruise/bleed easily.  Psychiatric/Behavioral: Negative for dysphoric mood. The patient is not nervous/anxious.      Objective:   Physical Exam  Constitutional: She appears well-developed and well-nourished. No distress.  HENT:  Head: Normocephalic and atraumatic.  Right Ear: External ear normal.  Left Ear: External ear normal.  Mouth/Throat: Oropharynx is clear and moist. No oropharyngeal exudate.  Eyes: Conjunctivae normal and EOM are normal. Pupils are equal, round, and reactive to light. Right eye exhibits no discharge. Left eye exhibits no discharge. No scleral icterus.  Neck: Normal range of motion. Neck  supple. No JVD present. Carotid bruit is not present. No thyromegaly present.  Cardiovascular: Normal rate, regular rhythm, normal heart sounds and intact distal pulses.  Exam reveals no gallop.   Pulmonary/Chest: Effort normal and breath sounds normal. No respiratory distress. She has no wheezes. She has no rales. She exhibits tenderness.       L anterior and lateral cw tenderness without crepitus or skin change   Abdominal: Soft. Bowel sounds are normal. She exhibits no distension, no abdominal bruit and no mass. There is no tenderness.  Musculoskeletal: She exhibits no edema.  Lymphadenopathy:    She has no cervical adenopathy.  Neurological: She is alert. She has normal reflexes. No cranial nerve deficit. She exhibits normal muscle tone. Coordination normal.  Skin: Skin is warm and dry. No rash noted. No erythema. No pallor.  Psychiatric: She has a normal mood and affect.          Assessment & Plan:

## 2012-06-01 NOTE — Assessment & Plan Note (Signed)
L sided and intermittent and positional  cxr and rib films today

## 2012-06-01 NOTE — Telephone Encounter (Signed)
Order for CT of chest

## 2012-06-01 NOTE — Patient Instructions (Addendum)
xrays of chest and ribs today  Also leave a urine specimen today  Use a warm compress and try to do some stretches

## 2012-06-02 ENCOUNTER — Encounter: Payer: Self-pay | Admitting: Internal Medicine

## 2012-06-02 ENCOUNTER — Ambulatory Visit (AMBULATORY_SURGERY_CENTER): Payer: BC Managed Care – PPO

## 2012-06-02 VITALS — Ht 63.5 in | Wt 144.4 lb

## 2012-06-02 DIAGNOSIS — Z1211 Encounter for screening for malignant neoplasm of colon: Secondary | ICD-10-CM

## 2012-06-02 NOTE — Telephone Encounter (Signed)
CT Chest set up for 06/07/2012 at 1:30pm. [facility]

## 2012-06-03 LAB — URINE CULTURE: Colony Count: NO GROWTH

## 2012-06-04 ENCOUNTER — Telehealth: Payer: Self-pay | Admitting: Family Medicine

## 2012-06-04 NOTE — Telephone Encounter (Signed)
Caller: Avrie/Patient; Phone: [phone]; Reason for Call: Returning a call from Dr.  Milinda Antis Nurse regarding lab results. Reviewed EPIC. Urine Culture is negative. Results provided . Caller expressed understanding.

## 2012-06-07 ENCOUNTER — Ambulatory Visit (INDEPENDENT_AMBULATORY_CARE_PROVIDER_SITE_OTHER)
Admission: RE | Admit: 2012-06-07 | Discharge: 2012-06-07 | Disposition: A | Discharge status: Home / Self Care | Payer: BC Managed Care – PPO | Source: Ambulatory Visit | Attending: Family Medicine | Admitting: Family Medicine

## 2012-06-07 DIAGNOSIS — R911 Solitary pulmonary nodule: Secondary | ICD-10-CM

## 2012-06-10 ENCOUNTER — Encounter: Payer: Self-pay | Admitting: Internal Medicine

## 2012-06-10 ENCOUNTER — Ambulatory Visit (AMBULATORY_SURGERY_CENTER): Payer: BC Managed Care – PPO | Admitting: Internal Medicine

## 2012-06-10 VITALS — BP 112/64 | HR 67 | Temp 99.3°F | Resp 23 | Ht 63.0 in | Wt 144.0 lb

## 2012-06-10 DIAGNOSIS — K635 Polyp of colon: Secondary | ICD-10-CM

## 2012-06-10 DIAGNOSIS — D126 Benign neoplasm of colon, unspecified: Secondary | ICD-10-CM

## 2012-06-10 DIAGNOSIS — Z1211 Encounter for screening for malignant neoplasm of colon: Secondary | ICD-10-CM

## 2012-06-10 MED ORDER — SODIUM CHLORIDE 0.9 % IV SOLN: 500.0000 mL | INTRAVENOUS | Status: DC

## 2012-06-10 NOTE — Patient Instructions (Addendum)
Discharge instructions given with verbal understanding. Handout on polyps given. Resume previous medications. YOU HAD AN ENDOSCOPIC PROCEDURE TODAY AT THE [facility]: Refer to the procedure report that was given to you for any specific questions about what was found during the examination.  If the procedure report does not answer your questions, please call your gastroenterologist to clarify.  If you requested that your care partner not be given the details of your procedure findings, then the procedure report has been included in a sealed envelope for you to review at your convenience later.  YOU SHOULD EXPECT: Some feelings of bloating in the abdomen. Passage of more gas than usual.  Walking can help get rid of the air that was put into your GI tract during the procedure and reduce the bloating. If you had a lower endoscopy (such as a colonoscopy or flexible sigmoidoscopy) you may notice spotting of blood in your stool or on the toilet paper. If you underwent a bowel prep for your procedure, then you may not have a normal bowel movement for a few days.  DIET: Your first meal following the procedure should be a light meal and then it is ok to progress to your normal diet.  A half-sandwich or bowl of soup is an example of a good first meal.  Heavy or fried foods are harder to digest and may make you feel nauseous or bloated.  Likewise meals heavy in dairy and vegetables can cause extra gas to form and this can also increase the bloating.  Drink plenty of fluids but you should avoid alcoholic beverages for 24 hours.  ACTIVITY: Your care partner should take you home directly after the procedure.  You should plan to take it easy, moving slowly for the rest of the day.  You can resume normal activity the day after the procedure however you should NOT DRIVE or use heavy machinery for 24 hours (because of the sedation medicines used during the test).    SYMPTOMS TO REPORT IMMEDIATELY: A  gastroenterologist can be reached at any hour.  During normal business hours, 8:30 AM to 5:00 PM Monday through Friday, call [phone].  After hours and on weekends, please call the GI answering service at [phone] who will take a message and have the physician on call contact you.   Following lower endoscopy (colonoscopy or flexible sigmoidoscopy):  Excessive amounts of blood in the stool  Significant tenderness or worsening of abdominal pains  Swelling of the abdomen that is new, acute  Fever of 100F or higher  FOLLOW UP: If any biopsies were taken you will be contacted by phone or by letter within the next 1-3 weeks.  Call your gastroenterologist if you have not heard about the biopsies in 3 weeks.  Our staff will call the home number listed on your records the next business day following your procedure to check on you and address any questions or concerns that you may have at that time regarding the information given to you following your procedure. This is a courtesy call and so if there is no answer at the home number and we have not heard from you through the emergency physician on call, we will assume that you have returned to your regular daily activities without incident.  SIGNATURES/CONFIDENTIALITY: You and/or your care partner have signed paperwork which will be entered into your electronic medical record.  These signatures attest to the fact that that the information above on your After Visit Summary has   been reviewed and is understood.  Full responsibility of the confidentiality of this discharge information lies with you and/or your care-partner. 

## 2012-06-10 NOTE — Progress Notes (Signed)
Lidocaine-40mg IV prior to Propofol InductionPropofol given over incremental dosages 

## 2012-06-10 NOTE — Progress Notes (Signed)
Called to room to assist during endoscopic procedure.  Patient ID and intended procedure confirmed with present staff. Received instructions for my participation in the procedure from the performing physician.  

## 2012-06-10 NOTE — Op Note (Signed)
[facility] [address] [place], [address]   COLONOSCOPY PROCEDURE REPORT  PATIENT: Tracy, Blankenship  MR#: [id number] BIRTHDATE: 10/11/1955 , 56  yrs. old GENDER: Female ENDOSCOPIST: Beverley Fiedler, MD REFERRED XB:JYNWG, Marne A. PROCEDURE DATE:  06/10/2012 PROCEDURE:   Colonoscopy with cold biopsy polypectomy ASA CLASS:   Class II INDICATIONS:average risk screening, first colonoscopy, and Change in bowel habits. MEDICATIONS: MAC sedation, administered by CRNA and propofol (Diprivan) 300mg  IV  DESCRIPTION OF PROCEDURE:   After the risks benefits and alternatives of the procedure were thoroughly explained, informed consent was obtained.  A digital rectal exam revealed no rectal mass.   The LB [id number]-[id number]  endoscope was introduced through the anus and advanced to the cecum, which was identified by both the appendix and ileocecal valve. No adverse events experienced. The quality of the prep was good, using MoviPrep  The instrument was then slowly withdrawn as the colon was fully examined.  COLON FINDINGS: A sessile polyp measuring 3 mm in size was found at the cecum.  A polypectomy was performed with cold forceps.  The resection was complete and the polyp tissue was completely retrieved.   The colon mucosa was otherwise normal.  Retroflexed views revealed no abnormalities. The time to cecum=3 minutes 44 seconds.  Withdrawal time=10 minutes 26 seconds.  The scope was withdrawn and the procedure completed.  COMPLICATIONS: There were no complications.  ENDOSCOPIC IMPRESSION: 1.   Sessile polyp measuring 3 mm in size was found at the cecum; polypectomy was performed with cold forceps 2.   The colon mucosa was otherwise normal  RECOMMENDATIONS: 1.  Await pathology results 2.  If the polyp removed today is proven to be an adenomatous (pre-cancerous) polyp, you will need a repeat colonoscopy in 5 years.  Otherwise you should continue to follow colorectal  cancer screening guidelines for "routine risk" patients with colonoscopy in 10 years.  You will receive a letter within 1-2 weeks with the results of your biopsy as well as final recommendations.  Please call my office if you have not received a letter after 3 weeks.   eSigned:  Beverley Fiedler, MD 06/10/2012 2:17 PM   cc: Judy Pimple, MD and The Patient   PATIENT NAME:  Tracy, Blankenship MR#: [id number]

## 2012-06-10 NOTE — Progress Notes (Signed)
Patient did not experience any of the following events: a burn prior to discharge; a fall within the facility; wrong site/side/patient/procedure/implant event; or a hospital transfer or hospital admission upon discharge from the facility. (G8907) Patient did not have preoperative order for IV antibiotic SSI prophylaxis. (G8918)  

## 2012-06-11 ENCOUNTER — Telehealth: Payer: Self-pay | Admitting: *Deleted

## 2012-06-11 NOTE — Telephone Encounter (Signed)
  Follow up Call-  Call back number 06/10/2012  Post procedure Call Back phone  # [phone]  Permission to leave phone message Yes     Patient questions:  Do you have a fever, pain , or abdominal swelling? no Pain Score  0 *  Have you tolerated food without any problems? yes  Have you been able to return to your normal activities? yes  Do you have any questions about your discharge instructions: Diet   no Medications  no Follow up visit  no  Do you have questions or concerns about your Care? no  Actions: * If pain score is 4 or above: No action needed, pain <4.

## 2012-06-16 ENCOUNTER — Encounter: Payer: Self-pay | Admitting: Internal Medicine

## 2012-06-30 ENCOUNTER — Other Ambulatory Visit: Payer: Self-pay | Admitting: Family Medicine

## 2012-06-30 DIAGNOSIS — Z1231 Encounter for screening mammogram for malignant neoplasm of breast: Secondary | ICD-10-CM

## 2012-08-04 ENCOUNTER — Ambulatory Visit
Admission: RE | Admit: 2012-08-04 | Discharge: 2012-08-04 | Disposition: A | Discharge status: Home / Self Care | Payer: BC Managed Care – PPO | Source: Ambulatory Visit | Attending: Family Medicine | Admitting: Family Medicine

## 2012-08-05 ENCOUNTER — Encounter: Payer: Self-pay | Admitting: *Deleted

## 2012-10-29 ENCOUNTER — Encounter: Payer: Self-pay | Admitting: Family Medicine

## 2012-10-29 ENCOUNTER — Ambulatory Visit (INDEPENDENT_AMBULATORY_CARE_PROVIDER_SITE_OTHER): Payer: BC Managed Care – PPO | Admitting: Family Medicine

## 2012-10-29 VITALS — BP 122/86 | HR 75 | Temp 99.2°F | Ht 63.5 in | Wt 143.5 lb

## 2012-10-29 DIAGNOSIS — N9489 Other specified conditions associated with female genital organs and menstrual cycle: Secondary | ICD-10-CM

## 2012-10-29 DIAGNOSIS — F430 Acute stress reaction: Secondary | ICD-10-CM

## 2012-10-29 DIAGNOSIS — N898 Other specified noninflammatory disorders of vagina: Secondary | ICD-10-CM

## 2012-10-29 DIAGNOSIS — R079 Chest pain, unspecified: Secondary | ICD-10-CM

## 2012-10-29 MED ORDER — ESTROGENS, CONJUGATED 0.625 MG/GM VA CREA: TOPICAL_CREAM | VAGINAL | Status: DC

## 2012-10-29 MED ORDER — RANITIDINE HCL 150 MG PO TABS: 150.0000 mg | ORAL_TABLET | Freq: Two times a day (BID) | ORAL | Status: DC

## 2012-10-29 NOTE — Progress Notes (Signed)
Subjective:    Patient ID: Tracy Blankenship, female    DOB: 1956-02-01, 57 y.o.   MRN: [id number]  HPI Here with occasional chest pain  Last few weeks - sharp pain just L of the sternum - sometimes sharp and sometimes discomfort  It lasts 20 seconds - almost fleeting  Can happen 0-6 times per day Not exertional  Is really stressed - ? If that plays a role - (not going to get better any time soon) No change to press on her chest  Does not hurt to take a deep breath   Not still taken zantac Has not taken levsin  Very busy- travels in car a lot - taking care of grandchild     Did have L sided flank pain - no cause found Has had imaging and also CT  That improved after cutting down dairy products   EKG normal NSR rate of 74  Had her screening tests - all are negative   Patient Active Problem List   Diagnosis Date Noted  . Left flank pain 06/01/2012  . Chest wall pain 06/01/2012  . Change in stool habits 01/23/2012  . Abdominal pain, left upper quadrant 04/29/2011  . Routine gynecological examination 04/22/2011  . Routine general medical examination at a health care facility 03/30/2011  . Urinary incontinence 01/07/2011  . ALLERGIC RHINITIS 12/21/2009  . STRESS REACTION, ACUTE 12/08/2006  . INTERSTITIAL CYSTITIS 12/08/2006  . MENOPAUSAL DISORDER 12/08/2006  . HYPERGLYCEMIA 12/08/2006   Past Medical History  Diagnosis Date  . Chronic sinusitis   . IC (interstitial cystitis)   . Hyperglycemia   . Stress reaction   . Plantar fasciitis   . Trigger finger    Past Surgical History  Procedure Laterality Date  . Appendectomy    . Cesarean section      X 2   History  Substance Use Topics  . Smoking status: Never Smoker   . Smokeless tobacco: Never Used  . Alcohol Use: Yes     Comment: wine -every 3-4 months   Family History  Problem Relation Age of Onset  . Anxiety disorder Mother   . Lupus Mother   . Emphysema Mother   . Coronary artery disease Mother   .  Lung cancer Mother   . Hypertension Mother   . Heart disease Mother   . COPD Mother   . Kidney disease Mother   . Cardiomyopathy Father   . Diabetes Father   . Stroke Father   . Colon cancer Neg Hx    Allergies  Allergen Reactions  . Penicillins     REACTION: ? allergy  . Pseudoephedrine     REACTION: makes her hyper   Current Outpatient Prescriptions on File Prior to Visit  Medication Sig Dispense Refill  . acetaminophen (TYLENOL) 500 MG tablet Take 500 mg by mouth every 6 (six) hours as needed.        . mometasone (NASONEX) 50 MCG/ACT nasal spray Place 1 spray into the nose daily.  17 g  11  . hyoscyamine (LEVSIN/SL) 0.125 MG SL tablet Place 1 tablet (0.125 mg total) under the tongue every 4 (four) hours as needed for cramping.  30 tablet  0  . ranitidine (ZANTAC) 150 MG tablet Take 150 mg by mouth as needed.       No current facility-administered medications on file prior to visit.    Review of Systems Review of Systems  Constitutional: Negative for fever, appetite change, fatigue and unexpected weight change.  Eyes: Negative for pain and visual disturbance.  Respiratory: Negative for cough and shortness of breath.   Cardiovascular: Negative for  palpitations   neg for pedal edema or pnd or orthopnea Gastrointestinal: Negative for nausea, diarrhea and constipation.  Genitourinary: Negative for urgency and frequency.  Skin: Negative for pallor or rash   Neurological: Negative for weakness, light-headedness, numbness and headaches.  Hematological: Negative for adenopathy. Does not bruise/bleed easily.  Psychiatric/Behavioral: Negative for dysphoric mood. The patient is not nervous/anxious.         Objective:   Physical Exam  Constitutional: She appears well-developed and well-nourished. No distress.  HENT:  Head: Normocephalic and atraumatic.  Mouth/Throat: Oropharynx is clear and moist.  Eyes: Conjunctivae and EOM are normal. Pupils are equal, round, and reactive to  light. Right eye exhibits no discharge. Left eye exhibits no discharge. No scleral icterus.  Neck: Normal range of motion. Neck supple. No JVD present. Carotid bruit is not present. No thyromegaly present.  Cardiovascular: Normal rate and regular rhythm.   Pulmonary/Chest: Effort normal and breath sounds normal. No respiratory distress. She has no wheezes. She has no rales. She exhibits no tenderness.  Abdominal: Soft. Bowel sounds are normal. She exhibits no distension, no abdominal bruit and no mass. There is no tenderness.  Musculoskeletal: She exhibits no edema and no tenderness.  Lymphadenopathy:    She has no cervical adenopathy.  Neurological: She is alert. She has normal reflexes. No cranial nerve deficit. She exhibits normal muscle tone. Coordination normal.  Skin: Skin is warm and dry. No rash noted. No erythema. No pallor.  Psychiatric: She has a normal mood and affect.  Seems stressed but pleasant and attentive           Assessment & Plan:

## 2012-10-29 NOTE — Assessment & Plan Note (Signed)
Trial of premarin cream and update  Disc poss risks of hormone tx  Will use scant amt twice weekly

## 2012-10-29 NOTE — Assessment & Plan Note (Signed)
Long disc over stressors/ coping tech and physical symptoms  Offered counseling Pt will think about that

## 2012-10-29 NOTE — Patient Instructions (Signed)
Try zantac to see if it makes a difference in chest symptoms If chest symptoms worsen or change let me know  Try vaginal estrogen as needed  Take care of yourself  If you want to see a counselor in the future for stress reaction, let me know

## 2012-10-29 NOTE — Assessment & Plan Note (Signed)
Atypical with nl EKG (sinus rhythm rate 74 and no acute changes) Suspect stress related/ cannot r/o gerd/ GI Trial of zantac bid Also offerec counseling Will follow

## 2013-06-19 ENCOUNTER — Telehealth: Payer: Self-pay | Admitting: Family Medicine

## 2013-06-19 DIAGNOSIS — R7309 Other abnormal glucose: Secondary | ICD-10-CM

## 2013-06-19 DIAGNOSIS — Z0000 Encounter for general adult medical examination without abnormal findings: Secondary | ICD-10-CM

## 2013-06-19 NOTE — Telephone Encounter (Signed)
Message copied by Abner Greenspan on Sun Jun 19, 2013 10:48 AM ------      Message from: Ellamae Sia      Created: Fri Jun 17, 2013  9:39 AM      Regarding: Lab orders for Monday, 1.26.15       Patient is scheduled for CPX labs, please order future labs, Thanks , Terri       ------

## 2013-06-20 ENCOUNTER — Other Ambulatory Visit (INDEPENDENT_AMBULATORY_CARE_PROVIDER_SITE_OTHER): Payer: BC Managed Care – PPO

## 2013-06-20 DIAGNOSIS — Z0000 Encounter for general adult medical examination without abnormal findings: Secondary | ICD-10-CM

## 2013-06-20 DIAGNOSIS — R7309 Other abnormal glucose: Secondary | ICD-10-CM

## 2013-06-20 LAB — CBC WITH DIFFERENTIAL/PLATELET
BASOS PCT: 0.2 % (ref 0.0–3.0)
Basophils Absolute: 0 10*3/uL (ref 0.0–0.1)
EOS PCT: 2.8 % (ref 0.0–5.0)
Eosinophils Absolute: 0.1 10*3/uL (ref 0.0–0.7)
HCT: 40.2 % (ref 36.0–46.0)
Hemoglobin: 13.9 g/dL (ref 12.0–15.0)
LYMPHS PCT: 41 % (ref 12.0–46.0)
Lymphs Abs: 1.6 10*3/uL (ref 0.7–4.0)
MCHC: 34.4 g/dL (ref 30.0–36.0)
MCV: 86.3 fl (ref 78.0–100.0)
MONO ABS: 0.2 10*3/uL (ref 0.1–1.0)
MONOS PCT: 6.4 % (ref 3.0–12.0)
NEUTROS PCT: 49.6 % (ref 43.0–77.0)
Neutro Abs: 1.9 10*3/uL (ref 1.4–7.7)
PLATELETS: 247 10*3/uL (ref 150.0–400.0)
RBC: 4.66 Mil/uL (ref 3.87–5.11)
RDW: 13 % (ref 11.5–14.6)
WBC: 3.8 10*3/uL — AB (ref 4.5–10.5)

## 2013-06-20 LAB — LIPID PANEL
Cholesterol: 188 mg/dL (ref 0–200)
HDL: 57.4 mg/dL (ref >39.00)
LDL Cholesterol: 118 mg/dL — ABNORMAL HIGH (ref 0–99)
Total CHOL/HDL Ratio: 3
Triglycerides: 61 mg/dL (ref 0.0–149.0)
VLDL: 12.2 mg/dL (ref 0.0–40.0)

## 2013-06-20 LAB — HEMOGLOBIN A1C: HEMOGLOBIN A1C: 5.5 % (ref 4.6–6.5)

## 2013-06-20 LAB — COMPREHENSIVE METABOLIC PANEL
ALBUMIN: 3.9 g/dL (ref 3.5–5.2)
ALK PHOS: 61 U/L (ref 39–117)
ALT: 16 U/L (ref 0–35)
AST: 21 U/L (ref 0–37)
BUN: 14 mg/dL (ref 6–23)
CALCIUM: 9.2 mg/dL (ref 8.4–10.5)
CHLORIDE: 106 meq/L (ref 96–112)
CO2: 28 meq/L (ref 19–32)
Creatinine, Ser: 0.8 mg/dL (ref 0.4–1.2)
GFR: 76.05 mL/min (ref >60.00)
GLUCOSE: 88 mg/dL (ref 70–99)
POTASSIUM: 3.9 meq/L (ref 3.5–5.1)
SODIUM: 140 meq/L (ref 135–145)
TOTAL PROTEIN: 7 g/dL (ref 6.0–8.3)
Total Bilirubin: 0.6 mg/dL (ref 0.3–1.2)

## 2013-06-20 LAB — TSH: TSH: 1.03 u[IU]/mL (ref 0.35–5.50)

## 2013-06-22 ENCOUNTER — Encounter: Payer: Self-pay | Admitting: Family Medicine

## 2013-06-22 ENCOUNTER — Ambulatory Visit (INDEPENDENT_AMBULATORY_CARE_PROVIDER_SITE_OTHER): Payer: BC Managed Care – PPO | Admitting: Family Medicine

## 2013-06-22 VITALS — BP 122/72 | HR 82 | Temp 98.3°F | Ht 63.25 in | Wt 147.5 lb

## 2013-06-22 DIAGNOSIS — R7309 Other abnormal glucose: Secondary | ICD-10-CM

## 2013-06-22 DIAGNOSIS — Z23 Encounter for immunization: Secondary | ICD-10-CM

## 2013-06-22 DIAGNOSIS — Z0000 Encounter for general adult medical examination without abnormal findings: Secondary | ICD-10-CM

## 2013-06-22 NOTE — Progress Notes (Signed)
Pre-visit discussion using our clinic review tool. No additional management support is needed unless otherwise documented below in the visit note.  

## 2013-06-22 NOTE — Patient Instructions (Signed)
Take care of yourself  Think about starting an exercise program  Flu vaccine today  Don't forget to schedule your mammogram in march Try to get 1200-1500 mg of calcium per day with at least 1000 iu of vitamin D - for bone health

## 2013-06-22 NOTE — Progress Notes (Signed)
Subjective:    Patient ID: Tracy Blankenship, female    DOB: August 18, 1955, 58 y.o.   MRN: [id number]  HPI Here for health maintenance exam and to review chronic medical problems    Feeling good overall Fair self care  Eats a fairly healthy diet -more fruit and steamed veggies Needs to exercise - but has an active lifestyle Is retired   [place] is up 5 lb with bmi of 25  Flu vaccine - did not get her vaccine  [facility]-she will make her own appt No lumps on self exam  Pap 12/13 nl  colonosc 1/14 Td 12/13   Hyperglycemia Lab Results  Component Value Date   HGBA1C 5.5 06/20/2013   glucose 88 Is watching her sugar intake / and no sweets at all     Chemistry      Component Value Date/Time   NA 140 06/20/2013 0856   K 3.9 06/20/2013 0856   CL 106 06/20/2013 0856   CO2 28 06/20/2013 0856   BUN 14 06/20/2013 0856   CREATININE 0.8 06/20/2013 0856      Component Value Date/Time   CALCIUM 9.2 06/20/2013 0856   ALKPHOS 61 06/20/2013 0856   AST 21 06/20/2013 0856   ALT 16 06/20/2013 0856   BILITOT 0.6 06/20/2013 0856      Lab Results  Component Value Date   WBC 3.8* 06/20/2013   HGB 13.9 06/20/2013   HCT 40.2 06/20/2013   MCV 86.3 06/20/2013   PLT 247.0 06/20/2013    Lab Results  Component Value Date   CHOL 188 06/20/2013   CHOL 200 04/12/2012   CHOL 184 04/01/2011   Lab Results  Component Value Date   HDL 57.40 06/20/2013   HDL 58.00 04/12/2012   HDL 64.20 04/01/2011   Lab Results  Component Value Date   LDLCALC 118* 06/20/2013   LDLCALC 125* 04/12/2012   LDLCALC 107* 04/01/2011   Lab Results  Component Value Date   TRIG 61.0 06/20/2013   TRIG 85.0 04/12/2012   TRIG 62.0 04/01/2011   Lab Results  Component Value Date   CHOLHDL 3 06/20/2013   CHOLHDL 3 04/12/2012   CHOLHDL 3 04/01/2011   Lab Results  Component Value Date   LDLDIRECT 126.5 12/17/2009   pretty good - also a little improved  No longer eating biscuits like she was before  retirement  Patient Active Problem List   Diagnosis Date Noted  . Vaginal dryness 10/29/2012  . Left flank pain 06/01/2012  . Routine gynecological examination 04/22/2011  . Routine general medical examination at a health care facility 03/30/2011  . Urinary incontinence 01/07/2011  . ALLERGIC RHINITIS 12/21/2009  . STRESS REACTION, ACUTE 12/08/2006  . INTERSTITIAL CYSTITIS 12/08/2006  . MENOPAUSAL DISORDER 12/08/2006  . HYPERGLYCEMIA 12/08/2006   Past Medical History  Diagnosis Date  . Chronic sinusitis   . IC (interstitial cystitis)   . Hyperglycemia   . Stress reaction   . Plantar fasciitis   . Trigger finger    Past Surgical History  Procedure Laterality Date  . Appendectomy    . Cesarean section      X 2   History  Substance Use Topics  . Smoking status: Never Smoker   . Smokeless tobacco: Never Used  . Alcohol Use: Yes     Comment: wine -every 3-4 months   Family History  Problem Relation Age of Onset  . Anxiety disorder Mother   . Lupus Mother   .  Emphysema Mother   . Coronary artery disease Mother   . Lung cancer Mother   . Hypertension Mother   . Heart disease Mother   . COPD Mother   . Kidney disease Mother   . Cardiomyopathy Father   . Diabetes Father   . Stroke Father   . Colon cancer Neg Hx    Allergies  Allergen Reactions  . Penicillins     REACTION: ? allergy  . Pseudoephedrine     REACTION: makes her hyper   Current Outpatient Prescriptions on File Prior to Visit  Medication Sig Dispense Refill  . acetaminophen (TYLENOL) 500 MG tablet Take 500 mg by mouth every 6 (six) hours as needed.        . hyoscyamine (LEVSIN/SL) 0.125 MG SL tablet Place 1 tablet (0.125 mg total) under the tongue every 4 (four) hours as needed for cramping.  30 tablet  0  . ranitidine (ZANTAC) 150 MG tablet Take 1 tablet (150 mg total) by mouth 2 (two) times daily.  60 tablet  3   No current facility-administered medications on file prior to visit.      Review  of Systems Review of Systems  Constitutional: Negative for fever, appetite change, fatigue and unexpected weight change.  Eyes: Negative for pain and visual disturbance.  Respiratory: Negative for cough and shortness of breath.   Cardiovascular: Negative for cp or palpitations    Gastrointestinal: Negative for nausea, diarrhea and constipation.  Genitourinary: Negative for urgency and frequency.  Skin: Negative for pallor or rash   Neurological: Negative for weakness, light-headedness, numbness and headaches.  Hematological: Negative for adenopathy. Does not bruise/bleed easily.  Psychiatric/Behavioral: Negative for dysphoric mood. The patient is not nervous/anxious.         Objective:   Physical Exam  Constitutional: She appears well-developed and well-nourished. No distress.  HENT:  Head: Normocephalic and atraumatic.  Right Ear: External ear normal.  Left Ear: External ear normal.  Mouth/Throat: Oropharynx is clear and moist.  Eyes: Conjunctivae and EOM are normal. Pupils are equal, round, and reactive to light. No scleral icterus.  Neck: Normal range of motion. Neck supple. No JVD present. Carotid bruit is not present. No thyromegaly present.  Cardiovascular: Normal rate, regular rhythm, normal heart sounds and intact distal pulses.  Exam reveals no gallop.   Pulmonary/Chest: Effort normal and breath sounds normal. No respiratory distress. She has no wheezes. She exhibits no tenderness.  Abdominal: Soft. Bowel sounds are normal. She exhibits no distension, no abdominal bruit and no mass. There is no tenderness.  Genitourinary: No breast swelling, tenderness, discharge or bleeding.  Breast exam: No mass, nodules, thickening, tenderness, bulging, retraction, inflamation, nipple discharge or skin changes noted.  No axillary or clavicular LA.  Musculoskeletal: Normal range of motion. She exhibits no edema and no tenderness.  Lymphadenopathy:    She has no cervical adenopathy.   Neurological: She is alert. She has normal reflexes. No cranial nerve deficit. She exhibits normal muscle tone. Coordination normal.  Skin: Skin is warm and dry. No rash noted. No erythema. No pallor.  Solar lentigos diffusely   Psychiatric: She has a normal mood and affect.  cheerful          Assessment & Plan:

## 2013-06-22 NOTE — Assessment & Plan Note (Signed)
Not problematic  Lab Results  Component Value Date   HGBA1C 5.5 06/20/2013   Watching sugar in diet  Disc finding time for exercise

## 2013-06-22 NOTE — Assessment & Plan Note (Signed)
Reviewed health habits including diet and exercise and skin cancer prevention Reviewed appropriate screening tests for age  Also reviewed health mt list, fam hx and immunization status , as well as social and family history   See HPI Flu vaccine today  Rev need for ca and D She will schedule her own mammogram in march

## 2013-07-07 ENCOUNTER — Other Ambulatory Visit: Payer: Self-pay

## 2013-07-07 DIAGNOSIS — Z1231 Encounter for screening mammogram for malignant neoplasm of breast: Secondary | ICD-10-CM

## 2013-08-05 ENCOUNTER — Ambulatory Visit: Payer: BC Managed Care – PPO

## 2013-09-05 ENCOUNTER — Ambulatory Visit
Admission: RE | Admit: 2013-09-05 | Discharge: 2013-09-05 | Disposition: A | Discharge status: Home / Self Care | Payer: BC Managed Care – PPO | Source: Ambulatory Visit

## 2013-09-05 DIAGNOSIS — Z1231 Encounter for screening mammogram for malignant neoplasm of breast: Secondary | ICD-10-CM

## 2013-09-06 ENCOUNTER — Encounter: Payer: Self-pay | Admitting: *Deleted

## 2013-11-08 ENCOUNTER — Encounter (INDEPENDENT_AMBULATORY_CARE_PROVIDER_SITE_OTHER): Payer: Self-pay

## 2013-11-08 ENCOUNTER — Ambulatory Visit (INDEPENDENT_AMBULATORY_CARE_PROVIDER_SITE_OTHER): Payer: BC Managed Care – PPO | Admitting: Family Medicine

## 2013-11-08 ENCOUNTER — Encounter: Payer: Self-pay | Admitting: Family Medicine

## 2013-11-08 VITALS — BP 134/78 | HR 80 | Temp 98.5°F | Ht 63.25 in | Wt 151.5 lb

## 2013-11-08 DIAGNOSIS — T7840XA Allergy, unspecified, initial encounter: Secondary | ICD-10-CM | POA: Insufficient documentation

## 2013-11-08 NOTE — Progress Notes (Signed)
Pre visit review using our clinic review tool, if applicable. No additional management support is needed unless otherwise documented below in the visit note. 

## 2013-11-08 NOTE — Patient Instructions (Signed)
Avoid shellfish ( and all seafood) Get zyrtec 10 mg otc and take one daily  Stop at check out for allergy referral  If symptoms worsen let us know -go to ER if short of breath

## 2013-11-08 NOTE — Progress Notes (Signed)
Subjective:    Patient ID: Tracy Blankenship, female    DOB: Feb 16, 1956, 58 y.o.   MRN: [id number]  HPI Here with allergic reaction   Went to [facility] on Thursday - felt a scratchy throat and stopped up ear that night Same late Friday  No fever -not feeling bad  By Saturday - her throat felt swollen (but not sore) - mostly on the L side  Sunday am- splotchy rash on chest  Took mucinex D   Then face got puffy and splotchy later in the day   Got benadryl - took 1 pill four times   Lips feel swollen and tingly   Slightly itchy  No sunburn and no tick bites   She ate some shrimp Thursday night -and 2 other nights  No hx of shellfish allergy in the past   Did also use a new hairspray  Patient Active Problem List   Diagnosis Date Noted  . Vaginal dryness 10/29/2012  . Routine gynecological examination 04/22/2011  . Routine general medical examination at a health care facility 03/30/2011  . Urinary incontinence 01/07/2011  . ALLERGIC RHINITIS 12/21/2009  . INTERSTITIAL CYSTITIS 12/08/2006  . MENOPAUSAL DISORDER 12/08/2006  . HYPERGLYCEMIA 12/08/2006   Past Medical History  Diagnosis Date  . Chronic sinusitis   . IC (interstitial cystitis)   . Hyperglycemia   . Stress reaction   . Plantar fasciitis   . Trigger finger    Past Surgical History  Procedure Laterality Date  . Appendectomy    . Cesarean section      X 2   History  Substance Use Topics  . Smoking status: Never Smoker   . Smokeless tobacco: Never Used  . Alcohol Use: Yes     Comment: wine -every 3-4 months   Family History  Problem Relation Age of Onset  . Anxiety disorder Mother   . Lupus Mother   . Emphysema Mother   . Coronary artery disease Mother   . Lung cancer Mother   . Hypertension Mother   . Heart disease Mother   . COPD Mother   . Kidney disease Mother   . Cardiomyopathy Father   . Diabetes Father   . Stroke Father   . Colon cancer Neg Hx    Allergies  Allergen Reactions    . Penicillins     REACTION: ? allergy  . Pseudoephedrine     REACTION: makes her hyper   Current Outpatient Prescriptions on File Prior to Visit  Medication Sig Dispense Refill  . acetaminophen (TYLENOL) 500 MG tablet Take 500 mg by mouth every 6 (six) hours as needed.        . hyoscyamine (LEVSIN/SL) 0.125 MG SL tablet Place 1 tablet (0.125 mg total) under the tongue every 4 (four) hours as needed for cramping.  30 tablet  0  . Triamcinolone Acetonide (NASACORT ALLERGY 24HR NA) Place 2 sprays into the nose daily.      . ranitidine (ZANTAC) 150 MG tablet Take 1 tablet (150 mg total) by mouth 2 (two) times daily.  60 tablet  3   No current facility-administered medications on file prior to visit.    Review of Systems Review of Systems  Constitutional: Negative for fever, appetite change, fatigue and unexpected weight change.  Eyes: Negative for pain and visual disturbance.  ENT :throat no longer feels swollen, pos for swelling of face  Respiratory: Negative for cough and shortness of breath.   Cardiovascular: Negative for cp or palpitations  Gastrointestinal: Negative for nausea, diarrhea and constipation.  Genitourinary: Negative for urgency and frequency.  Skin: Negative for pallor and pos for itchy rash   Neurological: Negative for weakness, light-headedness, numbness and headaches.  Hematological: Negative for adenopathy. Does not bruise/bleed easily.  Psychiatric/Behavioral: Negative for dysphoric mood. The patient is not nervous/anxious.         Objective:   Physical Exam  Constitutional: She appears well-developed and well-nourished. No distress.  HENT:  Head: Normocephalic and atraumatic.  Right Ear: External ear normal.  Left Ear: External ear normal.  Nose: Nose normal.  Mouth/Throat: Oropharynx is clear and moist.  Mild swelling of cheeks/under eyes No lip swelling No tongue or mouth/throat swelling  Nl speech  Eyes: Conjunctivae and EOM are normal. Pupils  are equal, round, and reactive to light. Right eye exhibits no discharge. Left eye exhibits no discharge.  Neck: Normal range of motion. Neck supple.  Cardiovascular: Normal rate and regular rhythm.   Pulmonary/Chest: Effort normal and breath sounds normal. No respiratory distress. She has no wheezes. She has no rales.  Musculoskeletal: She exhibits no edema and no tenderness.  Lymphadenopathy:    She has no cervical adenopathy.  Neurological: She is alert. She has normal reflexes.  Skin: Skin is warm and dry. Rash noted. There is erythema.  Diffuse blotchy erythematous rash over cheeks/neck and chest  No whelps or papules   Psychiatric: She has a normal mood and affect.          Assessment & Plan:   Problem List Items Addressed This Visit     Other   Allergic reaction - Primary     After eating shellfish on several occasions-involving rash/ itching/ mouth swelling and facial swelling  Improved with benadryl  Will inst her to take zyrtec daily but udate if worse/ seek urgent care if any sob or if mouth swelling returns  Ref to allergist  Suspect shellfish allergy    Relevant Orders      Ambulatory referral to Allergy

## 2013-11-09 NOTE — Assessment & Plan Note (Signed)
After eating shellfish on several occasions-involving rash/ itching/ mouth swelling and facial swelling  Improved with benadryl  Will inst her to take zyrtec daily but udate if worse/ seek urgent care if any sob or if mouth swelling returns  Ref to allergist  Suspect shellfish allergy

## 2014-01-31 ENCOUNTER — Ambulatory Visit (INDEPENDENT_AMBULATORY_CARE_PROVIDER_SITE_OTHER): Payer: BC Managed Care – PPO | Admitting: Family Medicine

## 2014-01-31 ENCOUNTER — Encounter: Payer: Self-pay | Admitting: Family Medicine

## 2014-01-31 VITALS — BP 118/82 | HR 74 | Temp 99.3°F | Ht 63.25 in | Wt 150.0 lb

## 2014-01-31 DIAGNOSIS — B029 Zoster without complications: Secondary | ICD-10-CM

## 2014-01-31 DIAGNOSIS — Z8619 Personal history of other infectious and parasitic diseases: Secondary | ICD-10-CM | POA: Insufficient documentation

## 2014-01-31 MED ORDER — VALACYCLOVIR HCL 1 G PO TABS: 1000.0000 mg | ORAL_TABLET | Freq: Three times a day (TID) | ORAL | Status: DC

## 2014-01-31 NOTE — Progress Notes (Signed)
Subjective:    Patient ID: Tracy Blankenship, female    DOB: 1955/11/18, 58 y.o.   MRN: [id number]  HPI Here with a rash  On abdomen  This started Thursday  She had just used spray paint "again" (wonders if fumes make a difference)  She did wear long sleeves and gloves and mask -covered all over  She did wear a T shirt- and got sweaty    She had an allergy referral - and was tested for last rash    All to mold and dust mites  Was able to eat shrimp and did a shrimp challenge- and was fine after eating it   Patient Active Problem List   Diagnosis Date Noted  . Allergic reaction 11/08/2013  . Vaginal dryness 10/29/2012  . Routine gynecological examination 04/22/2011  . Routine general medical examination at a health care facility 03/30/2011  . Urinary incontinence 01/07/2011  . ALLERGIC RHINITIS 12/21/2009  . INTERSTITIAL CYSTITIS 12/08/2006  . MENOPAUSAL DISORDER 12/08/2006  . HYPERGLYCEMIA 12/08/2006   Past Medical History  Diagnosis Date  . Chronic sinusitis   . IC (interstitial cystitis)   . Hyperglycemia   . Stress reaction   . Plantar fasciitis   . Trigger finger    Past Surgical History  Procedure Laterality Date  . Appendectomy    . Cesarean section      X 2   History  Substance Use Topics  . Smoking status: Never Smoker   . Smokeless tobacco: Never Used  . Alcohol Use: Yes     Comment: wine -every 3-4 months   Family History  Problem Relation Age of Onset  . Anxiety disorder Mother   . Lupus Mother   . Emphysema Mother   . Coronary artery disease Mother   . Lung cancer Mother   . Hypertension Mother   . Heart disease Mother   . COPD Mother   . Kidney disease Mother   . Cardiomyopathy Father   . Diabetes Father   . Stroke Father   . Colon cancer Neg Hx    Allergies  Allergen Reactions  . Penicillins     REACTION: ? allergy  . Pseudoephedrine     REACTION: makes her hyper   Current Outpatient Prescriptions on File Prior to Visit    Medication Sig Dispense Refill  . acetaminophen (TYLENOL) 500 MG tablet Take 500 mg by mouth every 6 (six) hours as needed.        . Triamcinolone Acetonide (NASACORT ALLERGY 24HR NA) Place 2 sprays into the nose daily.      . ranitidine (ZANTAC) 150 MG tablet Take 1 tablet (150 mg total) by mouth 2 (two) times daily.  60 tablet  3   No current facility-administered medications on file prior to visit.      Review of Systems Review of Systems  Constitutional: Negative for fever, appetite change, fatigue and unexpected weight change.  Eyes: Negative for pain and visual disturbance.  ENT neg for mouth/ throat swelling  Respiratory: Negative for cough and shortness of breath.  neg for wheezing  Cardiovascular: Negative for cp or palpitations    Gastrointestinal: Negative for nausea, diarrhea and constipation.  Genitourinary: Negative for urgency and frequency.  Skin: Negative for pallor and pos for uncomfortable rash on abdomen Neurological: Negative for weakness, light-headedness, numbness and headaches.  Hematological: Negative for adenopathy. Does not bruise/bleed easily.  Psychiatric/Behavioral: Negative for dysphoric mood. The patient is not nervous/anxious.  Objective:   Physical Exam  Constitutional: She appears well-developed and well-nourished. No distress.  HENT:  Head: Normocephalic and atraumatic.  Mouth/Throat: Oropharynx is clear and moist.  Eyes: Conjunctivae and EOM are normal. Pupils are equal, round, and reactive to light. Right eye exhibits no discharge. Left eye exhibits no discharge.  Neck: Normal range of motion. Neck supple.  Cardiovascular: Normal rate, regular rhythm and normal heart sounds.   Pulmonary/Chest: Effort normal and breath sounds normal. She has no wheezes.  Musculoskeletal: She exhibits no edema.  Lymphadenopathy:    She has no cervical adenopathy.  Skin: Skin is warm and dry. Rash noted. There is erythema.  Rash on L side of abdomen-  erythematous papules and some vesicles  Mildly sensitive/tender  Psychiatric: She has a normal mood and affect.          Assessment & Plan:   Problem List Items Addressed This Visit     Other   Herpes zoster - Primary     tx with valtrex Pain med-will call if needed  slt atypical presentation  Disc keeping rash clean Handout given  Update if not starting to improve in a week or if worsening      Relevant Medications      valACYclovir (VALTREX) tablet

## 2014-01-31 NOTE — Patient Instructions (Signed)
Your rash resembles shingles Take valtrex as directed  If pain begins -let me know  Keep rash clean and dry   Shingles Shingles (herpes zoster) is an infection that is caused by the same virus that causes chickenpox (varicella). The infection causes a painful skin rash and fluid-filled blisters, which eventually break open, crust over, and heal. It may occur in any area of the body, but it usually affects only one side of the body or face. The pain of shingles usually lasts about 1 month. However, some people with shingles may develop long-term (chronic) pain in the affected area of the body. Shingles often occurs many years after the person had chickenpox. It is more common:  In people older than 50 years.  In people with weakened immune systems, such as those with HIV, AIDS, or cancer.  In people taking medicines that weaken the immune system, such as transplant medicines.  In people under great stress. CAUSES  Shingles is caused by the varicella zoster virus (VZV), which also causes chickenpox. After a person is infected with the virus, it can remain in the person's body for years in an inactive state (dormant). To cause shingles, the virus reactivates and breaks out as an infection in a nerve root. The virus can be spread from person to person (contagious) through contact with open blisters of the shingles rash. It will only spread to people who have not had chickenpox. When these people are exposed to the virus, they may develop chickenpox. They will not develop shingles. Once the blisters scab over, the person is no longer contagious and cannot spread the virus to others. SIGNS AND SYMPTOMS  Shingles shows up in stages. The initial symptoms may be pain, itching, and tingling in an area of the skin. This pain is usually described as burning, stabbing, or throbbing.In a few days or weeks, a painful red rash will appear in the area where the pain, itching, and tingling were felt. The rash is  usually on one side of the body in a band or belt-like pattern. Then, the rash usually turns into fluid-filled blisters. They will scab over and dry up in approximately 2-3 weeks. Flu-like symptoms may also occur with the initial symptoms, the rash, or the blisters. These may include:  Fever.  Chills.  Headache.  Upset stomach. DIAGNOSIS  Your health care provider will perform a skin exam to diagnose shingles. Skin scrapings or fluid samples may also be taken from the blisters. This sample will be examined under a microscope or sent to a lab for further testing. TREATMENT  There is no specific cure for shingles. Your health care provider will likely prescribe medicines to help you manage the pain, recover faster, and avoid long-term problems. This may include antiviral drugs, anti-inflammatory drugs, and pain medicines. HOME CARE INSTRUCTIONS   Take a cool bath or apply cool compresses to the area of the rash or blisters as directed. This may help with the pain and itching.   Take medicines only as directed by your health care provider.   Rest as directed by your health care provider.  Keep your rash and blisters clean with mild soap and cool water or as directed by your health care provider.  Do not pick your blisters or scratch your rash. Apply an anti-itch cream or numbing creams to the affected area as directed by your health care provider.  Keep your shingles rash covered with a loose bandage (dressing).  Avoid skin contact with:  Babies.  Pregnant women.   Children with eczema.   Elderly people with transplants.   People with chronic illnesses, such as leukemia or AIDS.   Wear loose-fitting clothing to help ease the pain of material rubbing against the rash.  Keep all follow-up visits as directed by your health care provider.If the area involved is on your face, you may receive a referral for a specialist, such as an eye doctor (ophthalmologist) or an ear,  nose, and throat (ENT) doctor. Keeping all follow-up visits will help you avoid eye problems, chronic pain, or disability.  SEEK IMMEDIATE MEDICAL CARE IF:   You have facial pain, pain around the eye area, or loss of feeling on one side of your face.  You have ear pain or ringing in your ear.  You have loss of taste.  Your pain is not relieved with prescribed medicines.   Your redness or swelling spreads.   You have more pain and swelling.  Your condition is worsening or has changed.   You have a fever. MAKE SURE YOU:  Understand these instructions.  Will watch your condition.  Will get help right away if you are not doing well or get worse. Document Released: 05/12/2005 Document Revised: 09/26/2013 Document Reviewed: 12/25/2011 [facility] Patient Information 2015 [place], [place]. This information is not intended to replace advice given to you by your health care provider. Make sure you discuss any questions you have with your health care provider.

## 2014-01-31 NOTE — Progress Notes (Signed)
Pre visit review using our clinic review tool, if applicable. No additional management support is needed unless otherwise documented below in the visit note. 

## 2014-02-01 NOTE — Assessment & Plan Note (Signed)
tx with valtrex Pain med-will call if needed  slt atypical presentation  Disc keeping rash clean Handout given  Update if not starting to improve in a week or if worsening

## 2014-03-03 ENCOUNTER — Telehealth: Payer: Self-pay | Admitting: Family Medicine

## 2014-03-03 NOTE — Telephone Encounter (Signed)
Patient Information:  Caller Name: Janeann  Phone: [phone]  Patient: Tracy Blankenship, Tracy Blankenship  Gender: Female  DOB: 09/18/1955  Age: 58 Years  PCP: Loura Pardon [facility])  Office Follow Up:  Does the office need to follow up with this patient?: No  Instructions For The Office: N/A  RN Note:  Patient calling regarding recent strain to shoulder while attempting to open windows that had been painted shut.  denies direct injury.  All triage negative.  Symptoms  Reason For Call & Symptoms: right shoulder injury  Reviewed Health History In EMR: Yes  Reviewed Medications In EMR: Yes  Reviewed Allergies In EMR: Yes  Reviewed Surgeries / Procedures: Yes  Date of Onset of Symptoms: 03/02/2014  Guideline(s) Used:  Shoulder Injury  Disposition Per Guideline:   Home Care  Reason For Disposition Reached:   Minor shoulder injury  Advice Given:  Apply a Cold Pack:  Apply a cold pack or an ice bag (wrapped in a moist towel) to the area for 20 minutes. Repeat in 1 hour, then every 4 hours while awake.  Continue this for the first 48 hours after an injury.  This will help decrease pain and swelling.  Apply Heat to the Area:  Beginning 48 hours after an injury, apply a warm washcloth or heating pad for 10 minutes three times a day.  This will help increase blood flow and improve healing.  Rest vs. Movement:  Continue normal activities as much as your pain permits.  Avoid heavy lifting and active sports for 1-2 weeks or until the pain and swelling are gone.  Complete rest should only be used for the first day or two after an injury. If it really hurts too use the arm at all, you will need to see the doctor.  Expected Course:  Pain, swelling, and bruising usually start to get better 2 to 3 days after an injury.  Swelling most often is gone after 1 week.  Bruises fade away slowly over 1-2 weeks.  It may take 2 weeks for pain and tenderness of the injured area to go away.  Call Back If:  Pain becomes severe  Pain does not improve after 3 days  Pain or swelling lasts more than 2 weeks  You become worse.  Patient Will Follow Care Advice:  YES

## 2014-03-06 ENCOUNTER — Encounter: Payer: Self-pay | Admitting: Family Medicine

## 2014-03-06 ENCOUNTER — Ambulatory Visit (INDEPENDENT_AMBULATORY_CARE_PROVIDER_SITE_OTHER): Payer: BC Managed Care – PPO | Admitting: Family Medicine

## 2014-03-06 VITALS — BP 122/68 | HR 75 | Temp 98.3°F | Resp 14 | Wt 150.5 lb

## 2014-03-06 DIAGNOSIS — M25519 Pain in unspecified shoulder: Secondary | ICD-10-CM | POA: Insufficient documentation

## 2014-03-06 DIAGNOSIS — M25511 Pain in right shoulder: Secondary | ICD-10-CM

## 2014-03-06 NOTE — Patient Instructions (Signed)
Take 1-2 aleve with food up to twice a day and use the exercises. If not better, then notify us.

## 2014-03-06 NOTE — Progress Notes (Signed)
Pre visit review using our clinic review tool, if applicable. No additional management support is needed unless otherwise documented below in the visit note.  Had been closing a lot of windows (house was painted).  Window was stuck and she was trying to close it, straining to close it.  Pain the R shoulder afterward.  No L sided sx.  Pain sleeping on R side.  Pain washing her hair, overhead movement.  Heat and ice didn't help.  Didn't take nsaids yet.  Tylenol didn't help.  No elbow wrist or neck pain now.    Meds, vitals, and allergies reviewed.   ROS: See HPI.  Otherwise, noncontributory.  nad ncat Neck supple, no LA rrr ctab R shoulder with pain on int>ext rotation.  + supraspinatus testing AC not ttp No arm drop.  + impingement.   Distally nv intact

## 2014-03-06 NOTE — Assessment & Plan Note (Signed)
Likely cuff strain, anatomy d/w pt.  Handout given.  Use exercise and take 1-2 aleve with food up to twice a day.  If not better, then notify us.

## 2014-03-28 ENCOUNTER — Ambulatory Visit (INDEPENDENT_AMBULATORY_CARE_PROVIDER_SITE_OTHER): Payer: BC Managed Care – PPO | Admitting: Family Medicine

## 2014-03-28 ENCOUNTER — Encounter: Payer: Self-pay | Admitting: Family Medicine

## 2014-03-28 VITALS — BP 130/78 | HR 86 | Temp 98.3°F | Ht 63.25 in | Wt 150.2 lb

## 2014-03-28 DIAGNOSIS — N898 Other specified noninflammatory disorders of vagina: Secondary | ICD-10-CM

## 2014-03-28 DIAGNOSIS — R300 Dysuria: Secondary | ICD-10-CM

## 2014-03-28 DIAGNOSIS — N3000 Acute cystitis without hematuria: Secondary | ICD-10-CM

## 2014-03-28 DIAGNOSIS — N390 Urinary tract infection, site not specified: Secondary | ICD-10-CM | POA: Insufficient documentation

## 2014-03-28 LAB — POCT URINALYSIS DIPSTICK
PH UA: 5.5
Spec Grav, UA: >=1.03

## 2014-03-28 MED ORDER — LEVOFLOXACIN 250 MG PO TABS: 250.0000 mg | ORAL_TABLET | Freq: Every day | ORAL | Status: DC

## 2014-03-28 NOTE — Patient Instructions (Signed)
Take levaquin as directed Drink lots of water  Pending a culture results -will contact you  Update if not starting to improve in several days or if worsening

## 2014-03-28 NOTE — Assessment & Plan Note (Signed)
ua pos -even with azo tx with levaquin -she does well with this ? If also flare of IC cx urine Enc fluids Update

## 2014-03-28 NOTE — Progress Notes (Signed)
Pre visit review using our clinic review tool, if applicable. No additional management support is needed unless otherwise documented below in the visit note. 

## 2014-03-28 NOTE — Assessment & Plan Note (Signed)
Pt decided to try premarin cream px in the past  Will update if helpful Disc poss side eff/pt aware

## 2014-03-28 NOTE — Progress Notes (Signed)
Subjective:    Patient ID: Tracy Blankenship, female    DOB: June 28, 1955, 58 y.o.   MRN: [id number]  HPI Here for uti symptoms (of note pt has hx of IC that has been stable) and also more uncomfortable intercourse/ vaginal dryness   Symptoms started Sat am  Started with burning to urinate - worse as the day went on with frequency  She drank some tea sat night - that may have agrivated it further  Worse Sunday am - so she bought some AZO standard - used Sunday and Monday and it helped a bit   More discomfort - with intercourse lately from vaginal dryness  Has a px for premarin cream - she is worried about side effects - read about them    Results for orders placed or performed in visit on 03/28/14  POCT urinalysis dipstick  Result Value Ref Range   Color, UA orange    Clarity, UA cloudy    Glucose, UA     Bilirubin, UA     Ketones, UA     Spec Grav, UA >=1.030    Blood, UA Large    pH, UA 5.5    Protein, UA     Urobilinogen, UA     Nitrite, UA     Leukocytes, UA large (3+)     Patient Active Problem List   Diagnosis Date Noted  . Pain in joint, shoulder region 03/06/2014  . Herpes zoster 01/31/2014  . Allergic reaction 11/08/2013  . Vaginal dryness 10/29/2012  . Routine gynecological examination 04/22/2011  . Routine general medical examination at a health care facility 03/30/2011  . Urinary incontinence 01/07/2011  . ALLERGIC RHINITIS 12/21/2009  . INTERSTITIAL CYSTITIS 12/08/2006  . MENOPAUSAL DISORDER 12/08/2006  . HYPERGLYCEMIA 12/08/2006   Past Medical History  Diagnosis Date  . Chronic sinusitis   . IC (interstitial cystitis)   . Hyperglycemia   . Stress reaction   . Plantar fasciitis   . Trigger finger    Past Surgical History  Procedure Laterality Date  . Appendectomy    . Cesarean section      X 2   History  Substance Use Topics  . Smoking status: Never Smoker   . Smokeless tobacco: Never Used  . Alcohol Use: 0.0 oz/week    0 Not specified per  week     Comment: wine -every 3-4 months   Family History  Problem Relation Age of Onset  . Anxiety disorder Mother   . Lupus Mother   . Emphysema Mother   . Coronary artery disease Mother   . Lung cancer Mother   . Hypertension Mother   . Heart disease Mother   . COPD Mother   . Kidney disease Mother   . Cardiomyopathy Father   . Diabetes Father   . Stroke Father   . Colon cancer Neg Hx    Allergies  Allergen Reactions  . Penicillins     REACTION: ? allergy  . Pseudoephedrine     REACTION: makes her hyper   Current Outpatient Prescriptions on File Prior to Visit  Medication Sig Dispense Refill  . acetaminophen (TYLENOL) 500 MG tablet Take 500 mg by mouth every 6 (six) hours as needed.      . Cetirizine HCl 10 MG CAPS Take 1 capsule by mouth daily as needed.     . Triamcinolone Acetonide (NASACORT ALLERGY 24HR NA) Place 2 sprays into the nose daily.    Marland Kitchen triamcinolone cream (KENALOG) 0.1 %  Apply 1 application topically 2 (two) times daily as needed.      No current facility-administered medications on file prior to visit.    Review of Systems Review of Systems  Constitutional: Negative for fever, appetite change, fatigue and unexpected weight change.  Eyes: Negative for pain and visual disturbance.  Respiratory: Negative for cough and shortness of breath.   Cardiovascular: Negative for cp or palpitations    Gastrointestinal: Negative for nausea, diarrhea and constipation.  Genitourinary: pos for urgency and frequency. pos for dysuria/ neg for flank pain , pos for vaginal dryness and dyspareunia  Skin: Negative for pallor or rash   Neurological: Negative for weakness, light-headedness, numbness and headaches.  Hematological: Negative for adenopathy. Does not bruise/bleed easily.  Psychiatric/Behavioral: Negative for dysphoric mood. The patient is not nervous/anxious.         Objective:   Physical Exam  Constitutional: She appears well-developed and well-nourished.  No distress.  HENT:  Head: Normocephalic and atraumatic.  Mouth/Throat: Oropharynx is clear and moist.  Eyes: Conjunctivae and EOM are normal. Pupils are equal, round, and reactive to light. No scleral icterus.  Neck: Normal range of motion. Neck supple.  Cardiovascular: Normal rate and regular rhythm.   Pulmonary/Chest: Effort normal and breath sounds normal. No respiratory distress. She has no rales.  Abdominal: Soft. Bowel sounds are normal. She exhibits no distension and no mass. There is tenderness. There is no rebound and no guarding.  Mild suprapubic tenderness No cva tenderness   Musculoskeletal: She exhibits no edema.  Lymphadenopathy:    She has no cervical adenopathy.  Neurological: She is alert.  Skin: Skin is warm and dry. No rash noted. No erythema.  Psychiatric: She has a normal mood and affect.          Assessment & Plan:   Problem List Items Addressed This Visit      Genitourinary   UTI (urinary tract infection) - Primary    ua pos -even with azo tx with levaquin -she does well with this ? If also flare of IC cx urine Enc fluids Update     Relevant Orders      Urine culture   Vaginal dryness    Pt decided to try premarin cream px in the past  Will update if helpful Disc poss side eff/pt aware      Other Visit Diagnoses    Dysuria        Relevant Orders       POCT urinalysis dipstick (Completed)

## 2014-03-31 LAB — URINE CULTURE: Colony Count: >=100000

## 2014-08-23 ENCOUNTER — Other Ambulatory Visit: Payer: Self-pay

## 2014-08-23 DIAGNOSIS — Z1231 Encounter for screening mammogram for malignant neoplasm of breast: Secondary | ICD-10-CM

## 2014-09-07 ENCOUNTER — Ambulatory Visit
Admission: RE | Admit: 2014-09-07 | Discharge: 2014-09-07 | Disposition: A | Discharge status: Home / Self Care | Payer: BC Managed Care – PPO | Source: Ambulatory Visit

## 2014-09-07 DIAGNOSIS — Z1231 Encounter for screening mammogram for malignant neoplasm of breast: Secondary | ICD-10-CM

## 2014-09-08 ENCOUNTER — Telehealth: Payer: Self-pay

## 2014-09-08 NOTE — Telephone Encounter (Signed)
Patient aware of normal mammogram results.

## 2014-09-08 NOTE — Telephone Encounter (Signed)
-----   Message from Abner Greenspan, MD sent at 09/08/2014  1:40 PM EDT ----- Mammogram is normal  Please note for flow sheet if you can  Due for next screening mammogram in 1 year

## 2015-01-31 ENCOUNTER — Telehealth: Payer: Self-pay | Admitting: Family Medicine

## 2015-01-31 DIAGNOSIS — R739 Hyperglycemia, unspecified: Secondary | ICD-10-CM

## 2015-01-31 DIAGNOSIS — Z0000 Encounter for general adult medical examination without abnormal findings: Secondary | ICD-10-CM

## 2015-01-31 NOTE — Telephone Encounter (Signed)
-----   Message from Ellamae Sia sent at 01/25/2015  4:05 PM EDT ----- Regarding: Lab orders for Thursday, 9.8.16 Patient is scheduled for CPX labs, please order future labs, Thanks , Karna Christmas

## 2015-02-01 ENCOUNTER — Other Ambulatory Visit (INDEPENDENT_AMBULATORY_CARE_PROVIDER_SITE_OTHER): Payer: BC Managed Care – PPO

## 2015-02-01 DIAGNOSIS — R739 Hyperglycemia, unspecified: Secondary | ICD-10-CM | POA: Diagnosis not present

## 2015-02-01 DIAGNOSIS — Z0000 Encounter for general adult medical examination without abnormal findings: Secondary | ICD-10-CM

## 2015-02-01 LAB — CBC WITH DIFFERENTIAL/PLATELET
BASOS ABS: 0 10*3/uL (ref 0.0–0.1)
Basophils Relative: 0.5 % (ref 0.0–3.0)
EOS ABS: 0.1 10*3/uL (ref 0.0–0.7)
Eosinophils Relative: 3.2 % (ref 0.0–5.0)
HEMATOCRIT: 42.5 % (ref 36.0–46.0)
HEMOGLOBIN: 14.3 g/dL (ref 12.0–15.0)
LYMPHS PCT: 37.7 % (ref 12.0–46.0)
Lymphs Abs: 1.6 10*3/uL (ref 0.7–4.0)
MCHC: 33.5 g/dL (ref 30.0–36.0)
MCV: 87 fl (ref 78.0–100.0)
MONO ABS: 0.4 10*3/uL (ref 0.1–1.0)
Monocytes Relative: 8.4 % (ref 3.0–12.0)
NEUTROS ABS: 2.1 10*3/uL (ref 1.4–7.7)
NEUTROS PCT: 50.2 % (ref 43.0–77.0)
Platelets: 251 10*3/uL (ref 150.0–400.0)
RBC: 4.89 Mil/uL (ref 3.87–5.11)
RDW: 13.3 % (ref 11.5–15.5)
WBC: 4.3 10*3/uL (ref 4.0–10.5)

## 2015-02-01 LAB — COMPREHENSIVE METABOLIC PANEL
ALBUMIN: 4.3 g/dL (ref 3.5–5.2)
ALK PHOS: 75 U/L (ref 39–117)
ALT: 13 U/L (ref 0–35)
AST: 16 U/L (ref 0–37)
BUN: 18 mg/dL (ref 6–23)
CHLORIDE: 103 meq/L (ref 96–112)
CO2: 33 mEq/L — ABNORMAL HIGH (ref 19–32)
Calcium: 9.5 mg/dL (ref 8.4–10.5)
Creatinine, Ser: 0.81 mg/dL (ref 0.40–1.20)
GFR: 76.71 mL/min (ref >60.00)
GLUCOSE: 88 mg/dL (ref 70–99)
POTASSIUM: 4.4 meq/L (ref 3.5–5.1)
Sodium: 141 mEq/L (ref 135–145)
TOTAL PROTEIN: 6.8 g/dL (ref 6.0–8.3)
Total Bilirubin: 0.5 mg/dL (ref 0.2–1.2)

## 2015-02-01 LAB — LIPID PANEL
Cholesterol: 193 mg/dL (ref 0–200)
HDL: 59 mg/dL (ref >39.00)
LDL Cholesterol: 118 mg/dL — ABNORMAL HIGH (ref 0–99)
NonHDL: 134.49
TRIGLYCERIDES: 83 mg/dL (ref 0.0–149.0)
Total CHOL/HDL Ratio: 3
VLDL: 16.6 mg/dL (ref 0.0–40.0)

## 2015-02-01 LAB — HEMOGLOBIN A1C: HEMOGLOBIN A1C: 5.6 % (ref 4.6–6.5)

## 2015-02-01 LAB — TSH: TSH: 1.28 u[IU]/mL (ref 0.35–4.50)

## 2015-02-05 ENCOUNTER — Encounter: Payer: Self-pay | Admitting: Family Medicine

## 2015-02-05 ENCOUNTER — Ambulatory Visit (INDEPENDENT_AMBULATORY_CARE_PROVIDER_SITE_OTHER): Payer: BC Managed Care – PPO | Admitting: Family Medicine

## 2015-02-05 ENCOUNTER — Other Ambulatory Visit (HOSPITAL_COMMUNITY)
Admission: RE | Admit: 2015-02-05 | Discharge: 2015-02-05 | Disposition: A | Discharge status: Home / Self Care | Payer: BC Managed Care – PPO | Source: Ambulatory Visit | Attending: Family Medicine | Admitting: Family Medicine

## 2015-02-05 VITALS — BP 128/82 | HR 78 | Temp 98.8°F | Ht 63.5 in | Wt 159.2 lb

## 2015-02-05 DIAGNOSIS — Z23 Encounter for immunization: Secondary | ICD-10-CM

## 2015-02-05 DIAGNOSIS — Z01419 Encounter for gynecological examination (general) (routine) without abnormal findings: Secondary | ICD-10-CM

## 2015-02-05 DIAGNOSIS — Z0000 Encounter for general adult medical examination without abnormal findings: Secondary | ICD-10-CM

## 2015-02-05 DIAGNOSIS — R739 Hyperglycemia, unspecified: Secondary | ICD-10-CM

## 2015-02-05 LAB — HM PAP SMEAR: HM Pap smear: NORMAL

## 2015-02-05 NOTE — Progress Notes (Signed)
Pre visit review using our clinic review tool, if applicable. No additional management support is needed unless otherwise documented below in the visit note. 

## 2015-02-05 NOTE — Assessment & Plan Note (Signed)
Reviewed health habits including diet and exercise and skin cancer prevention Reviewed appropriate screening tests for age  Also reviewed health mt list, fam hx and immunization status , as well as social and family history   See HPI Labs reviewed  Flu shot today  Declines hep B and hiv screening due to low risk status  Enc her to get back to exercise

## 2015-02-05 NOTE — Assessment & Plan Note (Signed)
Lab Results  Component Value Date   HGBA1C 5.6 02/01/2015   Stable and well controlled with diet  Plans on getting back on track with exercise

## 2015-02-05 NOTE — Progress Notes (Signed)
Subjective:    Patient ID: Tracy Blankenship, female    DOB: [birth year], 59 y.o.   MRN: [id number]  HPI Here for health maintenance exam and to review chronic medical problems    Feeling ok overall   Wt is up 9 lb with bmi of 27 This discourages her  She was prev working on a house - and working hard physically and then went to the gym and used a fitbit Then she hurt her R hip moving furniture -now just getting better  Thinks she is ok to start back   Works with [employer]- keeps her social and active   Eating is pretty good   Stomach bothers her occ - takes align probiotic  More constipated than she used to be  occ more frequent stools   Hep C/HIV -not high risk / declines   Flu shot - will get today   She is interested in zoster vaccine in the future if covered   Pap nl 1/13  Has not had a hysterectomy  Libido is lower with age -expected  Has estrogen cream but has not used it   Mammogram 4/16 nl  Self exam- no lumps   Had a free dexa - testing a new machine at the [facility] center-will bring result  Sister has OP   Colonoscopy 1/14 adenomatous polyp- 5 year recall   Td 12/13   Hyperglycemia Lab Results  Component Value Date   HGBA1C 5.6 02/01/2015    Results for orders placed or performed in visit on 02/01/15  CBC with Differential/Platelet  Result Value Ref Range   WBC 4.3 4.0 - 10.5 K/uL   RBC 4.89 3.87 - 5.11 Mil/uL   Hemoglobin 14.3 12.0 - 15.0 g/dL   HCT 42.5 36.0 - 46.0 %   MCV 87.0 78.0 - 100.0 fl   MCHC 33.5 30.0 - 36.0 g/dL   RDW 13.3 11.5 - 15.5 %   Platelets 251.0 150.0 - 400.0 K/uL   Neutrophils Relative % 50.2 43.0 - 77.0 %   Lymphocytes Relative 37.7 12.0 - 46.0 %   Monocytes Relative 8.4 3.0 - 12.0 %   Eosinophils Relative 3.2 0.0 - 5.0 %   Basophils Relative 0.5 0.0 - 3.0 %   Neutro Abs 2.1 1.4 - 7.7 K/uL   Lymphs Abs 1.6 0.7 - 4.0 K/uL   Monocytes Absolute 0.4 0.1 - 1.0 K/uL   Eosinophils Absolute 0.1 0.0 - 0.7 K/uL   Basophils  Absolute 0.0 0.0 - 0.1 K/uL  Comprehensive metabolic panel  Result Value Ref Range   Sodium 141 135 - 145 mEq/L   Potassium 4.4 3.5 - 5.1 mEq/L   Chloride 103 96 - 112 mEq/L   CO2 33 (H) 19 - 32 mEq/L   Glucose, Bld 88 70 - 99 mg/dL   BUN 18 6 - 23 mg/dL   Creatinine, Ser 0.81 0.40 - 1.20 mg/dL   Total Bilirubin 0.5 0.2 - 1.2 mg/dL   Alkaline Phosphatase 75 39 - 117 U/L   AST 16 0 - 37 U/L   ALT 13 0 - 35 U/L   Total Protein 6.8 6.0 - 8.3 g/dL   Albumin 4.3 3.5 - 5.2 g/dL   Calcium 9.5 8.4 - 10.5 mg/dL   GFR 76.71 >60.00 mL/min  Hemoglobin A1c  Result Value Ref Range   Hgb A1c MFr Bld 5.6 4.6 - 6.5 %  Lipid panel  Result Value Ref Range   Cholesterol 193 0 - 200 mg/dL  Triglycerides 83.0 0.0 - 149.0 mg/dL   HDL 59.00 >39.00 mg/dL   VLDL 16.6 0.0 - 40.0 mg/dL   LDL Cholesterol 118 (H) 0 - 99 mg/dL   Total CHOL/HDL Ratio 3    NonHDL 134.49   TSH  Result Value Ref Range   TSH 1.28 0.35 - 4.50 uIU/mL     Patient Active Problem List   Diagnosis Date Noted  . Pain in joint, shoulder region 03/06/2014  . Herpes zoster 01/31/2014  . Allergic reaction 11/08/2013  . Vaginal dryness 10/29/2012  . Encounter for routine gynecological examination 04/22/2011  . Routine general medical examination at a health care facility 03/30/2011  . Urinary incontinence 01/07/2011  . ALLERGIC RHINITIS 12/21/2009  . INTERSTITIAL CYSTITIS 12/08/2006  . MENOPAUSAL DISORDER 12/08/2006  . Hyperglycemia 12/08/2006   Past Medical History  Diagnosis Date  . Chronic sinusitis   . IC (interstitial cystitis)   . Hyperglycemia   . Stress reaction   . Plantar fasciitis   . Trigger finger    Past Surgical History  Procedure Laterality Date  . Appendectomy    . Cesarean section      X 2   Social History  Substance Use Topics  . Smoking status: Never Smoker   . Smokeless tobacco: Never Used  . Alcohol Use: 0.0 oz/week    0 Standard drinks or equivalent per week     Comment: wine -every 3-4  months   Family History  Problem Relation Age of Onset  . Anxiety disorder Mother   . Lupus Mother   . Emphysema Mother   . Coronary artery disease Mother   . Lung cancer Mother   . Hypertension Mother   . Heart disease Mother   . COPD Mother   . Kidney disease Mother   . Cardiomyopathy Father   . Diabetes Father   . Stroke Father   . Colon cancer Neg Hx    Allergies  Allergen Reactions  . Penicillins     REACTION: ? allergy  . Pseudoephedrine     REACTION: makes her hyper   Current Outpatient Prescriptions on File Prior to Visit  Medication Sig Dispense Refill  . acetaminophen (TYLENOL) 500 MG tablet Take 500 mg by mouth every 6 (six) hours as needed.      . Cetirizine HCl 10 MG CAPS Take 1 capsule by mouth daily as needed.     . Triamcinolone Acetonide (NASACORT ALLERGY 24HR NA) Place 2 sprays into the nose daily.    Marland Kitchen triamcinolone cream (KENALOG) 0.1 % Apply 1 application topically 2 (two) times daily as needed.      No current facility-administered medications on file prior to visit.    Review of Systems Review of Systems  Constitutional: Negative for fever, appetite change, fatigue and unexpected weight change.  Eyes: Negative for pain and visual disturbance.  Respiratory: Negative for cough and shortness of breath.   Cardiovascular: Negative for cp or palpitations    Gastrointestinal: Negative for nausea, diarrhea and constipation.  Genitourinary: Negative for urgency and frequency. pos for low libido  Skin: Negative for pallor or rash   Neurological: Negative for weakness, light-headedness, numbness and headaches.  Hematological: Negative for adenopathy. Does not bruise/bleed easily.  Psychiatric/Behavioral: Negative for dysphoric mood. The patient is not nervous/anxious.         Objective:   Physical Exam  Constitutional: She appears well-developed and well-nourished. No distress.  overwt and well app  HENT:  Head: Normocephalic and atraumatic.  Right  Ear: External ear normal.  Left Ear: External ear normal.  Mouth/Throat: Oropharynx is clear and moist.  Eyes: Conjunctivae and EOM are normal. Pupils are equal, round, and reactive to light. No scleral icterus.  Neck: Normal range of motion. Neck supple. No JVD present. Carotid bruit is not present. No thyromegaly present.  Cardiovascular: Normal rate, regular rhythm, normal heart sounds and intact distal pulses.  Exam reveals no gallop.   Pulmonary/Chest: Effort normal and breath sounds normal. No respiratory distress. She has no wheezes. She exhibits no tenderness.  Abdominal: Soft. Bowel sounds are normal. She exhibits no distension, no abdominal bruit and no mass. There is no tenderness.  Genitourinary: Vagina normal and uterus normal. No breast swelling, tenderness, discharge or bleeding. There is no rash, tenderness or lesion on the right labia. There is no rash or tenderness on the left labia. Uterus is not enlarged and not tender. Cervix exhibits no motion tenderness, no discharge and no friability. Right adnexum displays no mass, no tenderness and no fullness. Left adnexum displays no mass, no tenderness and no fullness.  Breast exam: No mass, nodules, thickening, tenderness, bulging, retraction, inflamation, nipple discharge or skin changes noted.  No axillary or clavicular LA.      Musculoskeletal: Normal range of motion. She exhibits no edema or tenderness.  Lymphadenopathy:    She has no cervical adenopathy.  Neurological: She is alert. She has normal reflexes. No cranial nerve deficit. She exhibits normal muscle tone. Coordination normal.  Skin: Skin is warm and dry. No rash noted. No erythema. No pallor.  Lentigo diffusely  Psychiatric: She has a normal mood and affect.          Assessment & Plan:   Problem List Items Addressed This Visit      Other   Encounter for routine gynecological examination    Routine exam with pap today  No complaints except low libido with age    Also vaginal dryness - chose not to fill px for vaginal estrogen cream thus far       Relevant Orders   Cytology - PAP   Hyperglycemia - Primary    Lab Results  Component Value Date   HGBA1C 5.6 02/01/2015   Stable and well controlled with diet  Plans on getting back on track with exercise       Routine general medical examination at a health care facility    Reviewed health habits including diet and exercise and skin cancer prevention Reviewed appropriate screening tests for age  Also reviewed health mt list, fam hx and immunization status , as well as social and family history   See HPI Labs reviewed  Flu shot today  Declines hep B and hiv screening due to low risk status  Enc her to get back to exercise        Other Visit Diagnoses    Need for influenza vaccination        Relevant Orders    Flu Vaccine QUAD 36+ mos PF IM (Fluarix & Fluzone Quad PF) (Completed)

## 2015-02-05 NOTE — Assessment & Plan Note (Addendum)
Routine exam with pap today  No complaints except low libido with age  Also vaginal dryness - chose not to fill px for vaginal estrogen cream thus far

## 2015-02-05 NOTE — Patient Instructions (Signed)
Flu shot today If you are interested in a shingles/zoster vaccine - call your insurance to check on coverage,( you should not get it within 1 month of other vaccines) , then call us for a prescription  for it to take to a pharmacy that gives the shot , or make a nurse visit to get it here depending on your coverage   Take care of yourself- get back to exercise

## 2015-02-06 LAB — CYTOLOGY - PAP

## 2015-02-07 ENCOUNTER — Encounter: Payer: Self-pay | Admitting: *Deleted

## 2015-02-07 ENCOUNTER — Encounter: Payer: Self-pay | Admitting: Family Medicine

## 2015-08-17 ENCOUNTER — Other Ambulatory Visit: Payer: Self-pay

## 2015-08-17 DIAGNOSIS — Z1231 Encounter for screening mammogram for malignant neoplasm of breast: Secondary | ICD-10-CM

## 2015-09-04 ENCOUNTER — Encounter: Payer: Self-pay | Admitting: Internal Medicine

## 2015-09-04 ENCOUNTER — Ambulatory Visit (INDEPENDENT_AMBULATORY_CARE_PROVIDER_SITE_OTHER): Payer: BC Managed Care – PPO | Admitting: Internal Medicine

## 2015-09-04 VITALS — BP 122/78 | HR 87 | Temp 98.5°F | Wt 156.5 lb

## 2015-09-04 DIAGNOSIS — J329 Chronic sinusitis, unspecified: Secondary | ICD-10-CM

## 2015-09-04 DIAGNOSIS — B349 Viral infection, unspecified: Secondary | ICD-10-CM

## 2015-09-04 DIAGNOSIS — B9789 Other viral agents as the cause of diseases classified elsewhere: Secondary | ICD-10-CM

## 2015-09-04 MED ORDER — HYDROCODONE-HOMATROPINE 5-1.5 MG/5ML PO SYRP: 5.0000 mL | ORAL_SOLUTION | Freq: Three times a day (TID) | ORAL | Status: DC | PRN

## 2015-09-04 MED ORDER — METHYLPREDNISOLONE ACETATE 80 MG/ML IJ SUSP
80.0000 mg | Freq: Once | INTRAMUSCULAR | Status: AC
  Administered 2015-09-04: 80 mg via INTRAMUSCULAR

## 2015-09-04 NOTE — Patient Instructions (Signed)

## 2015-09-04 NOTE — Progress Notes (Signed)
Pre visit review using our clinic review tool, if applicable. No additional management support is needed unless otherwise documented below in the visit note. 

## 2015-09-04 NOTE — Progress Notes (Signed)
HPI  Pt presents to the clinic today with c/o nasal congestion, frontal headache, and sore throat. This started 3 days ago. She is unable to blow anything out of her nose. She has a nonproductive cough that seems worse at night from post nasal drip. She denies fever, chills or body aches. She has tried Mucinex D and Nasocort with minimal relief. Pt has a h/o seasonal allergies. She has not had sick contacts. Pt is concerned that she will be unwell this weekend when her family is in town visiting.   Review of Systems    Past Medical History  Diagnosis Date  . Chronic sinusitis   . IC (interstitial cystitis)   . Hyperglycemia   . Stress reaction   . Plantar fasciitis   . Trigger finger     Family History  Problem Relation Age of Onset  . Anxiety disorder Mother   . Lupus Mother   . Emphysema Mother   . Coronary artery disease Mother   . Lung cancer Mother   . Hypertension Mother   . Heart disease Mother   . COPD Mother   . Kidney disease Mother   . Cardiomyopathy Father   . Diabetes Father   . Stroke Father   . Colon cancer Neg Hx     Social History   Social History  . Marital Status: Married    Spouse Name: N/A  . Number of Children: 2  . Years of Education: N/A   Occupational History  . [employer]   Social History Main Topics  . Smoking status: Never Smoker   . Smokeless tobacco: Never Used  . Alcohol Use: 0.0 oz/week    0 Standard drinks or equivalent per week     Comment: wine -every 3-4 months  . Drug Use: No  . Sexual Activity: Not on file   Other Topics Concern  . Not on file   Social History Narrative    Allergies  Allergen Reactions  . Penicillins     REACTION: ? allergy  . Pseudoephedrine     REACTION: makes her hyper     Constitutional: Positive headache and fatigue. Denies fever and abrupt weight changes.  HEENT:  Positive eye pain, facial pain, nasal congestion and sore throat. Denies eye redness, ear pain, ringing  in the ears, wax buildup, runny nose or bloody nose. Respiratory: Positive for cough. Denies difficulty breathing or shortness of breath.  Cardiovascular: Denies chest pain, chest tightness, palpitations or swelling in the hands or feet.   No other specific complaints in a complete review of systems (except as listed in HPI above).  Objective:  BP 122/78 mmHg  Pulse 87  Temp(Src) 98.5 F (36.9 C) (Oral)  Wt 156 lb 8 oz (70.988 kg)  SpO2 96%   General: Appears her stated age, well developed, well nourished in NAD. HEENT: Head: normal shape and size, maxillary sinus tenderness noted; Eyes: sclera white, no icterus, conjunctiva pink; Ears: Tm's gray and intact, normal light reflex; Nose: mucosa boggy and moist, erythematous and inflamed, septum midline; Throat/Mouth: + PND. Teeth present, mucosa erythematous and moist, no exudate noted, no lesions or ulcerations noted.  Neck:  Anterior cervical lymphadenopathy  Cardiovascular: Normal rate and rhythm. S1,S2 noted.  No murmur, rubs or gallops noted.  Pulmonary/Chest: Normal effort and positive vesicular breath sounds. No respiratory distress. No wheezes, rales or ronchi noted.      Assessment & Plan:   Acute viral sinusitis  Depo 80 mg IM today  Continue using Nasicort daily Add is Zyrtec daily RX for Hycodan for PM cough  RTC if sxs not improving or worsen.

## 2015-09-06 ENCOUNTER — Telehealth: Payer: Self-pay

## 2015-09-06 ENCOUNTER — Other Ambulatory Visit: Payer: Self-pay | Admitting: Internal Medicine

## 2015-09-06 MED ORDER — AZITHROMYCIN 250 MG PO TABS: ORAL_TABLET | ORAL | Status: DC

## 2015-09-06 NOTE — Telephone Encounter (Signed)
Pt left v/m; pt was seen 09/04/15; pt was to cb Friday if not better to request abx; pt is not feeling a lot better and request abx to [facility]. Pt request cb.

## 2015-09-06 NOTE — Telephone Encounter (Signed)
Pt notified Rx sent to pharmacy

## 2015-09-06 NOTE — Telephone Encounter (Signed)
z pack sent to pharmacy

## 2015-09-10 ENCOUNTER — Ambulatory Visit: Payer: BC Managed Care – PPO

## 2015-09-27 ENCOUNTER — Ambulatory Visit: Payer: BC Managed Care – PPO

## 2015-10-23 ENCOUNTER — Ambulatory Visit
Admission: RE | Admit: 2015-10-23 | Discharge: 2015-10-23 | Disposition: A | Discharge status: Home / Self Care | Payer: BC Managed Care – PPO | Source: Ambulatory Visit

## 2015-10-23 DIAGNOSIS — Z1231 Encounter for screening mammogram for malignant neoplasm of breast: Secondary | ICD-10-CM

## 2015-10-23 LAB — HM MAMMOGRAPHY

## 2015-10-25 ENCOUNTER — Encounter: Payer: Self-pay | Admitting: *Deleted

## 2016-01-24 ENCOUNTER — Telehealth: Payer: Self-pay | Admitting: Family Medicine

## 2016-01-24 DIAGNOSIS — R739 Hyperglycemia, unspecified: Secondary | ICD-10-CM

## 2016-01-24 DIAGNOSIS — Z0000 Encounter for general adult medical examination without abnormal findings: Secondary | ICD-10-CM

## 2016-01-24 NOTE — Telephone Encounter (Signed)
-----   Message from Ellamae Sia sent at 01/23/2016  4:06 PM EDT ----- Regarding: Lab orders for Friday, 9.8.17 Patient is scheduled for CPX labs, please order future labs, Thanks , Karna Christmas

## 2016-02-01 ENCOUNTER — Other Ambulatory Visit (INDEPENDENT_AMBULATORY_CARE_PROVIDER_SITE_OTHER): Payer: BC Managed Care – PPO

## 2016-02-01 DIAGNOSIS — Z0000 Encounter for general adult medical examination without abnormal findings: Secondary | ICD-10-CM

## 2016-02-01 DIAGNOSIS — R739 Hyperglycemia, unspecified: Secondary | ICD-10-CM | POA: Diagnosis not present

## 2016-02-01 LAB — CBC WITH DIFFERENTIAL/PLATELET
Basophils Absolute: 0 10*3/uL (ref 0.0–0.1)
Basophils Relative: 0.5 % (ref 0.0–3.0)
Eosinophils Absolute: 0.1 10*3/uL (ref 0.0–0.7)
Eosinophils Relative: 3.4 % (ref 0.0–5.0)
HEMATOCRIT: 41 % (ref 36.0–46.0)
HEMOGLOBIN: 14.1 g/dL (ref 12.0–15.0)
LYMPHS PCT: 36.7 % (ref 12.0–46.0)
Lymphs Abs: 1.6 10*3/uL (ref 0.7–4.0)
MCHC: 34.4 g/dL (ref 30.0–36.0)
MCV: 86.9 fl (ref 78.0–100.0)
MONOS PCT: 8 % (ref 3.0–12.0)
Monocytes Absolute: 0.3 10*3/uL (ref 0.1–1.0)
Neutro Abs: 2.2 10*3/uL (ref 1.4–7.7)
Neutrophils Relative %: 51.4 % (ref 43.0–77.0)
Platelets: 270 10*3/uL (ref 150.0–400.0)
RBC: 4.72 Mil/uL (ref 3.87–5.11)
RDW: 12.7 % (ref 11.5–15.5)
WBC: 4.3 10*3/uL (ref 4.0–10.5)

## 2016-02-01 LAB — COMPREHENSIVE METABOLIC PANEL
ALBUMIN: 4.1 g/dL (ref 3.5–5.2)
ALK PHOS: 76 U/L (ref 39–117)
ALT: 16 U/L (ref 0–35)
AST: 19 U/L (ref 0–37)
BILIRUBIN TOTAL: 0.5 mg/dL (ref 0.2–1.2)
BUN: 15 mg/dL (ref 6–23)
CO2: 32 mEq/L (ref 19–32)
Calcium: 9.2 mg/dL (ref 8.4–10.5)
Chloride: 104 mEq/L (ref 96–112)
Creatinine, Ser: 0.81 mg/dL (ref 0.40–1.20)
GFR: 76.45 mL/min (ref >60.00)
GLUCOSE: 94 mg/dL (ref 70–99)
POTASSIUM: 3.9 meq/L (ref 3.5–5.1)
Sodium: 139 mEq/L (ref 135–145)
TOTAL PROTEIN: 7 g/dL (ref 6.0–8.3)

## 2016-02-01 LAB — HEMOGLOBIN A1C: Hgb A1c MFr Bld: 5.5 % (ref 4.6–6.5)

## 2016-02-01 LAB — LIPID PANEL
CHOLESTEROL: 207 mg/dL — AB (ref 0–200)
HDL: 54.7 mg/dL (ref >39.00)
LDL Cholesterol: 131 mg/dL — ABNORMAL HIGH (ref 0–99)
NONHDL: 152.44
Total CHOL/HDL Ratio: 4
Triglycerides: 109 mg/dL (ref 0.0–149.0)
VLDL: 21.8 mg/dL (ref 0.0–40.0)

## 2016-02-01 LAB — TSH: TSH: 1.48 u[IU]/mL (ref 0.35–4.50)

## 2016-02-08 ENCOUNTER — Encounter: Payer: Self-pay | Admitting: Family Medicine

## 2016-02-08 ENCOUNTER — Ambulatory Visit (INDEPENDENT_AMBULATORY_CARE_PROVIDER_SITE_OTHER): Payer: BC Managed Care – PPO | Admitting: Family Medicine

## 2016-02-08 VITALS — BP 132/78 | HR 90 | Temp 99.2°F | Ht 63.0 in | Wt 162.2 lb

## 2016-02-08 DIAGNOSIS — E785 Hyperlipidemia, unspecified: Secondary | ICD-10-CM | POA: Insufficient documentation

## 2016-02-08 DIAGNOSIS — R739 Hyperglycemia, unspecified: Secondary | ICD-10-CM

## 2016-02-08 DIAGNOSIS — Z23 Encounter for immunization: Secondary | ICD-10-CM

## 2016-02-08 DIAGNOSIS — Z0000 Encounter for general adult medical examination without abnormal findings: Secondary | ICD-10-CM

## 2016-02-08 NOTE — Patient Instructions (Addendum)
If you are interested in a shingles/zoster vaccine - call your insurance to check on coverage,( you should not get it within 1 month of other vaccines) , then call us for a prescription  for it to take to a pharmacy that gives the shot , or make a nurse visit to get it here depending on your coverage For cholesterol : Avoid red meat/ fried foods/ egg yolks/ fatty breakfast meats/ butter, cheese and high fat dairy/ and shellfish    Try to get 1200-1500 mg of calcium per day with at least 1000 iu of vitamin D - for bone health   Flu shot today

## 2016-02-08 NOTE — Assessment & Plan Note (Signed)
Reviewed health habits including diet and exercise and skin cancer prevention Reviewed appropriate screening tests for age  Also reviewed health mt list, fam hx and immunization status , as well as social and family history   See HPI Labs reviewed  If you are interested in a shingles/zoster vaccine - call your insurance to check on coverage,( you should not get it within 1 month of other vaccines) , then call us for a prescription  for it to take to a pharmacy that gives the shot , or make a nurse visit to get it here depending on your coverage For cholesterol : Avoid red meat/ fried foods/ egg yolks/ fatty breakfast meats/ butter, cheese and high fat dairy/ and shellfish    Try to get 1200-1500 mg of calcium per day with at least 1000 iu of vitamin D - for bone health   Flu shot today

## 2016-02-08 NOTE — Progress Notes (Signed)
Subjective:    Patient ID: Tracy Blankenship, female    DOB: 07/15/1955, 60 y.o.   MRN: [id number]  HPI Here for health maintenance exam and to review chronic medical problems    Feeling fair in general    Wt Readings from Last 3 Encounters:  02/08/16 162 lb 4 oz (73.6 kg)  09/04/15 156 lb 8 oz (71 kg)  02/05/15 159 lb 4 oz (72.2 kg)  weight is creeping up -not eating more and stays active  Mostly in the middle  bmi is 28.7  It is frustrating  Has joined a gym   She did start bio identical hormones  Understands the risks - is under a doctor's care  This so far has helped her anxiety  Helped libido a bit and vaginal dryness    Zoster vaccine -she is interested in a shingles vaccine - will check with insurance    Flu vaccine - wants to get today   Mammogram 5/17-normal Self breast exam- no lumps or changes   Colonoscopy 1/14- adenoma - 5 year recall  She has occ bloating and sometimes frequent bms  ? If IBS    Pap 9/16-normal No abn paps  No gyn problems besides vaginal dryness   Tetanus shot 12/13    Hx of hyperglycemia Lab Results  Component Value Date   HGBA1C 5.5 02/01/2016  very good control with diet  Plans to start exercise   BP Readings from Last 3 Encounters:  02/08/16 132/78  09/04/15 122/78  02/05/15 128/82     Results for orders placed or performed in visit on 02/01/16  Comprehensive metabolic panel  Result Value Ref Range   Sodium 139 135 - 145 mEq/L   Potassium 3.9 3.5 - 5.1 mEq/L   Chloride 104 96 - 112 mEq/L   CO2 32 19 - 32 mEq/L   Glucose, Bld 94 70 - 99 mg/dL   BUN 15 6 - 23 mg/dL   Creatinine, Ser 0.81 0.40 - 1.20 mg/dL   Total Bilirubin 0.5 0.2 - 1.2 mg/dL   Alkaline Phosphatase 76 39 - 117 U/L   AST 19 0 - 37 U/L   ALT 16 0 - 35 U/L   Total Protein 7.0 6.0 - 8.3 g/dL   Albumin 4.1 3.5 - 5.2 g/dL   Calcium 9.2 8.4 - 10.5 mg/dL   GFR 76.45 >60.00 mL/min  Lipid panel  Result Value Ref Range   Cholesterol 207 (H) 0 - 200  mg/dL   Triglycerides 109.0 0.0 - 149.0 mg/dL   HDL 54.70 >39.00 mg/dL   VLDL 21.8 0.0 - 40.0 mg/dL   LDL Cholesterol 131 (H) 0 - 99 mg/dL   Total CHOL/HDL Ratio 4    NonHDL 152.44   TSH  Result Value Ref Range   TSH 1.48 0.35 - 4.50 uIU/mL  Hemoglobin A1c  Result Value Ref Range   Hgb A1c MFr Bld 5.5 4.6 - 6.5 %  CBC with Differential/Platelet  Result Value Ref Range   WBC 4.3 4.0 - 10.5 K/uL   RBC 4.72 3.87 - 5.11 Mil/uL   Hemoglobin 14.1 12.0 - 15.0 g/dL   HCT 41.0 36.0 - 46.0 %   MCV 86.9 78.0 - 100.0 fl   MCHC 34.4 30.0 - 36.0 g/dL   RDW 12.7 11.5 - 15.5 %   Platelets 270.0 150.0 - 400.0 K/uL   Neutrophils Relative % 51.4 43.0 - 77.0 %   Lymphocytes Relative 36.7 12.0 - 46.0 %   Monocytes  Relative 8.0 3.0 - 12.0 %   Eosinophils Relative 3.4 0.0 - 5.0 %   Basophils Relative 0.5 0.0 - 3.0 %   Neutro Abs 2.2 1.4 - 7.7 K/uL   Lymphs Abs 1.6 0.7 - 4.0 K/uL   Monocytes Absolute 0.3 0.1 - 1.0 K/uL   Eosinophils Absolute 0.1 0.0 - 0.7 K/uL   Basophils Absolute 0.0 0.0 - 0.1 K/uL     Patient Active Problem List   Diagnosis Date Noted  . Mild hyperlipidemia 02/08/2016  . Pain in joint, shoulder region 03/06/2014  . Herpes zoster 01/31/2014  . Allergic reaction 11/08/2013  . Vaginal dryness 10/29/2012  . Encounter for routine gynecological examination 04/22/2011  . Routine general medical examination at a health care facility 03/30/2011  . Urinary incontinence 01/07/2011  . ALLERGIC RHINITIS 12/21/2009  . INTERSTITIAL CYSTITIS 12/08/2006  . Hyperglycemia 12/08/2006   Past Medical History:  Diagnosis Date  . Chronic sinusitis   . Hyperglycemia   . IC (interstitial cystitis)   . Plantar fasciitis   . Stress reaction   . Trigger finger    Past Surgical History:  Procedure Laterality Date  . APPENDECTOMY    . CESAREAN SECTION     X 2   Social History  Substance Use Topics  . Smoking status: Never Smoker  . Smokeless tobacco: Never Used  . Alcohol use 0.0  oz/week     Comment: wine -every 3-4 months   Family History  Problem Relation Age of Onset  . Anxiety disorder Mother   . Lupus Mother   . Emphysema Mother   . Coronary artery disease Mother   . Lung cancer Mother   . Hypertension Mother   . Heart disease Mother   . COPD Mother   . Kidney disease Mother   . Cardiomyopathy Father   . Diabetes Father   . Stroke Father   . Colon cancer Neg Hx    Allergies  Allergen Reactions  . Penicillins     REACTION: ? allergy  . Pseudoephedrine     REACTION: makes her hyper   Current Outpatient Prescriptions on File Prior to Visit  Medication Sig Dispense Refill  . Cetirizine HCl 10 MG CAPS Take 1 capsule by mouth daily as needed.     . Triamcinolone Acetonide (NASACORT ALLERGY 24HR NA) Place 2 sprays into the nose daily.     No current facility-administered medications on file prior to visit.     Review of Systems    Review of Systems  Constitutional: Negative for fever, appetite change,  and unexpected weight change. pos for fatigue  Eyes: Negative for pain and visual disturbance.  Respiratory: Negative for cough and shortness of breath.   Cardiovascular: Negative for cp or palpitations    Gastrointestinal: Negative for nausea, diarrhea and constipation.  Genitourinary: Negative for urgency and frequency.  Skin: Negative for pallor or rash   Neurological: Negative for weakness, light-headedness, numbness and headaches.  Hematological: Negative for adenopathy. Does not bruise/bleed easily.  Psychiatric/Behavioral: Negative for dysphoric mood. The patient is not nervous/anxious.      Objective:   Physical Exam  Constitutional: She appears well-developed and well-nourished. No distress.  obese and well appearing   HENT:  Head: Normocephalic and atraumatic.  Right Ear: External ear normal.  Left Ear: External ear normal.  Mouth/Throat: Oropharynx is clear and moist.  Eyes: Conjunctivae and EOM are normal. Pupils are equal,  round, and reactive to light. No scleral icterus.  Neck: Normal range of  motion. Neck supple. No JVD present. Carotid bruit is not present. No thyromegaly present.  Cardiovascular: Normal rate, regular rhythm, normal heart sounds and intact distal pulses.  Exam reveals no gallop.   Pulmonary/Chest: Effort normal and breath sounds normal. No respiratory distress. She has no wheezes. She exhibits no tenderness.  Abdominal: Soft. Bowel sounds are normal. She exhibits no distension, no abdominal bruit and no mass. There is no tenderness.  Genitourinary: No breast swelling, tenderness, discharge or bleeding.  Musculoskeletal: Normal range of motion. She exhibits no edema or tenderness.  Lymphadenopathy:    She has no cervical adenopathy.  Neurological: She is alert. She has normal reflexes. No cranial nerve deficit. She exhibits normal muscle tone. Coordination normal.  Skin: Skin is warm and dry. No rash noted. No erythema. No pallor.  Solar lentigines diffusely   Spot on face resembles clogged oil gland/seb hyperplasia   Psychiatric: She has a normal mood and affect.          Assessment & Plan:   Problem List Items Addressed This Visit      Other   Routine general medical examination at a health care facility    Reviewed health habits including diet and exercise and skin cancer prevention Reviewed appropriate screening tests for age  Also reviewed health mt list, fam hx and immunization status , as well as social and family history   See HPI Labs reviewed  If you are interested in a shingles/zoster vaccine - call your insurance to check on coverage,( you should not get it within 1 month of other vaccines) , then call us for a prescription  for it to take to a pharmacy that gives the shot , or make a nurse visit to get it here depending on your coverage For cholesterol : Avoid red meat/ fried foods/ egg yolks/ fatty breakfast meats/ butter, cheese and high fat dairy/ and shellfish     Try to get 1200-1500 mg of calcium per day with at least 1000 iu of vitamin D - for bone health   Flu shot today       Mild hyperlipidemia    Disc goals for lipids and reasons to control them Rev labs with pt Rev low sat fat diet in detail       Hyperglycemia - Primary    Lab Results  Component Value Date   HGBA1C 5.5 02/01/2016   This is stable and well controlled  Disc imp of low glycemic diet and wt loss  Enc to start an exercise program       Other Visit Diagnoses    Need for influenza vaccination       Relevant Orders   Flu Vaccine QUAD 36+ mos IM (Completed)

## 2016-02-08 NOTE — Assessment & Plan Note (Signed)
Lab Results  Component Value Date   HGBA1C 5.5 02/01/2016   This is stable and well controlled  Disc imp of low glycemic diet and wt loss  Enc to start an exercise program

## 2016-02-08 NOTE — Assessment & Plan Note (Signed)
Disc goals for lipids and reasons to control them Rev labs with pt Rev low sat fat diet in detail   

## 2016-02-08 NOTE — Progress Notes (Signed)
Pre visit review using our clinic review tool, if applicable. No additional management support is needed unless otherwise documented below in the visit note. 

## 2016-07-08 ENCOUNTER — Telehealth: Payer: BC Managed Care – PPO | Admitting: Family

## 2016-07-08 DIAGNOSIS — A499 Bacterial infection, unspecified: Secondary | ICD-10-CM

## 2016-07-08 DIAGNOSIS — N390 Urinary tract infection, site not specified: Secondary | ICD-10-CM

## 2016-07-08 MED ORDER — LEVOFLOXACIN 250 MG PO TABS: 250.0000 mg | ORAL_TABLET | Freq: Every day | ORAL | 0 refills | Status: DC

## 2016-07-08 NOTE — Progress Notes (Signed)
We are sorry that you are not feeling well.  Here is how we plan to help!  Based on what you shared with me it looks like you most likely have a simple urinary tract infection.  A UTI (Urinary Tract Infection) is a bacterial infection of the bladder.  Most cases of urinary tract infections are simple to treat but a key part of your care is to encourage you to drink plenty of fluids and watch your symptoms carefully.  I have prescribed Levaquin 250 mg daily x 5 days.  Your symptoms should gradually improve. Call us if the burning in your urine worsens, you develop worsening fever, back pain or pelvic pain or if your symptoms do not resolve after completing the antibiotic. You may also take AZO.  Urinary tract infections can be prevented by drinking plenty of water to keep your body hydrated.  Also be sure when you wipe, wipe from front to back and don't hold it in!  If possible, empty your bladder every 4 hours.  Your e-visit answers were reviewed by a board certified advanced clinical practitioner to complete your personal care plan.  Depending on the condition, your plan could have included both over the counter or prescription medications.  If there is a problem please reply  once you have received a response from your provider.  Your safety is important to [place].  If you have drug allergies check your prescription carefully.    You can use MyChart to ask questions about today's visit, request a non-urgent call back, or ask for a work or school excuse for 24 hours related to this e-Visit. If it has been greater than 24 hours you will need to follow up with your provider, or enter a new e-Visit to address those concerns.   You will get an e-mail in the next two days asking about your experience.  I hope that your e-visit has been valuable and will speed your recovery. Thank you for using e-visits.

## 2016-09-17 ENCOUNTER — Other Ambulatory Visit: Payer: Self-pay | Admitting: Family Medicine

## 2016-09-17 DIAGNOSIS — Z1231 Encounter for screening mammogram for malignant neoplasm of breast: Secondary | ICD-10-CM

## 2016-11-10 ENCOUNTER — Ambulatory Visit: Payer: BC Managed Care – PPO

## 2016-11-17 ENCOUNTER — Ambulatory Visit
Admission: RE | Admit: 2016-11-17 | Discharge: 2016-11-17 | Disposition: A | Discharge status: Home / Self Care | Payer: BC Managed Care – PPO | Source: Ambulatory Visit | Attending: Family Medicine | Admitting: Family Medicine

## 2016-11-17 DIAGNOSIS — Z1231 Encounter for screening mammogram for malignant neoplasm of breast: Secondary | ICD-10-CM

## 2016-12-24 ENCOUNTER — Encounter: Payer: Self-pay | Admitting: Family Medicine

## 2016-12-24 ENCOUNTER — Ambulatory Visit (INDEPENDENT_AMBULATORY_CARE_PROVIDER_SITE_OTHER): Payer: BC Managed Care – PPO | Admitting: Family Medicine

## 2016-12-24 VITALS — BP 130/82 | HR 83 | Temp 98.6°F | Ht 63.0 in | Wt 164.5 lb

## 2016-12-24 DIAGNOSIS — K529 Noninfective gastroenteritis and colitis, unspecified: Secondary | ICD-10-CM | POA: Insufficient documentation

## 2016-12-24 DIAGNOSIS — R140 Abdominal distension (gaseous): Secondary | ICD-10-CM | POA: Insufficient documentation

## 2016-12-24 LAB — CBC WITH DIFFERENTIAL/PLATELET
Basophils Absolute: 0 10*3/uL (ref 0.0–0.1)
Basophils Relative: 0.6 % (ref 0.0–3.0)
EOS PCT: 2.4 % (ref 0.0–5.0)
Eosinophils Absolute: 0.1 10*3/uL (ref 0.0–0.7)
HEMATOCRIT: 44.7 % (ref 36.0–46.0)
HEMOGLOBIN: 14.7 g/dL (ref 12.0–15.0)
LYMPHS ABS: 1.6 10*3/uL (ref 0.7–4.0)
LYMPHS PCT: 36.6 % (ref 12.0–46.0)
MCHC: 32.8 g/dL (ref 30.0–36.0)
MCV: 88.9 fl (ref 78.0–100.0)
MONOS PCT: 6.9 % (ref 3.0–12.0)
Monocytes Absolute: 0.3 10*3/uL (ref 0.1–1.0)
Neutro Abs: 2.3 10*3/uL (ref 1.4–7.7)
Neutrophils Relative %: 53.5 % (ref 43.0–77.0)
Platelets: 280 10*3/uL (ref 150.0–400.0)
RBC: 5.02 Mil/uL (ref 3.87–5.11)
RDW: 13.1 % (ref 11.5–15.5)
WBC: 4.3 10*3/uL (ref 4.0–10.5)

## 2016-12-24 LAB — COMPREHENSIVE METABOLIC PANEL
ALBUMIN: 4.3 g/dL (ref 3.5–5.2)
ALK PHOS: 80 U/L (ref 39–117)
ALT: 13 U/L (ref 0–35)
AST: 17 U/L (ref 0–37)
BILIRUBIN TOTAL: 0.5 mg/dL (ref 0.2–1.2)
BUN: 12 mg/dL (ref 6–23)
CALCIUM: 9.8 mg/dL (ref 8.4–10.5)
CHLORIDE: 101 meq/L (ref 96–112)
CO2: 32 mEq/L (ref 19–32)
Creatinine, Ser: 0.82 mg/dL (ref 0.40–1.20)
GFR: 75.15 mL/min (ref >60.00)
Glucose, Bld: 97 mg/dL (ref 70–99)
Potassium: 3.9 mEq/L (ref 3.5–5.1)
Sodium: 138 mEq/L (ref 135–145)
Total Protein: 7.2 g/dL (ref 6.0–8.3)

## 2016-12-24 LAB — TSH: TSH: 1.09 u[IU]/mL (ref 0.35–4.50)

## 2016-12-24 NOTE — Progress Notes (Signed)
Subjective:    Patient ID: Tracy Blankenship, female    DOB: September 06, 1955, 61 y.o.   MRN: [id number]  HPI Here for GI symptoms   Wt Readings from Last 3 Encounters:  12/24/16 164 lb 8 oz (74.6 kg)  02/08/16 162 lb 4 oz (73.6 kg)  09/04/15 156 lb 8 oz (71 kg)   29.14 kg/m  Abdomen/stomach Uncomfortable - not pain  No cramping  Has been w/u before  Feels bloated  Frequent stools -but normal (5 times daily with a lot of volume)  She is having trouble getting through the day due to freq stools  No blood or dark stools  Urge to go has woken her up / always when she wakes up also  No n/v  No fever  No mucous in stools  No recent antibiotics   She has changed her diet to help loose wt  Does not correlate with GI changes Cut off soda/ art sweeteners and tea  Cut out bread and fries and junk food  Eats higher fiber /grain chips  Also cereal with fruit  Tried not dairy-did not make a difference  More fruits and vegetables  Trying to inc water   Aware fatty foods make her worse    abd [place] neg in 2012 -rev with pt   She is on bio identical hormones-not new - cutting back gradually    Colonoscopy 1/14 with tubular adenoma (5 y) -rev with pt   Mother had digestive issues and lung cancer (smoker) No inflam bowel  Hernia surgery- ended up with a colostomy  Lab Results  Component Value Date   WBC 4.3 02/01/2016   HGB 14.1 02/01/2016   HCT 41.0 02/01/2016   MCV 86.9 02/01/2016   PLT 270.0 02/01/2016     Chemistry      Component Value Date/Time   NA 139 02/01/2016 0829   K 3.9 02/01/2016 0829   CL 104 02/01/2016 0829   CO2 32 02/01/2016 0829   BUN 15 02/01/2016 0829   CREATININE 0.81 02/01/2016 0829      Component Value Date/Time   CALCIUM 9.2 02/01/2016 0829   ALKPHOS 76 02/01/2016 0829   AST 19 02/01/2016 0829   ALT 16 02/01/2016 0829   BILITOT 0.5 02/01/2016 0829      Lab Results  Component Value Date   TSH 1.48 02/01/2016    Patient Active Problem  List   Diagnosis Date Noted  . Frequent stools 12/24/2016  . Abdominal bloating 12/24/2016  . Mild hyperlipidemia 02/08/2016  . Pain in joint, shoulder region 03/06/2014  . Herpes zoster 01/31/2014  . Allergic reaction 11/08/2013  . Vaginal dryness 10/29/2012  . Encounter for routine gynecological examination 04/22/2011  . Routine general medical examination at a health care facility 03/30/2011  . Urinary incontinence 01/07/2011  . ALLERGIC RHINITIS 12/21/2009  . INTERSTITIAL CYSTITIS 12/08/2006  . Hyperglycemia 12/08/2006   Past Medical History:  Diagnosis Date  . Chronic sinusitis   . Hyperglycemia   . IC (interstitial cystitis)   . Plantar fasciitis   . Stress reaction   . Trigger finger    Past Surgical History:  Procedure Laterality Date  . APPENDECTOMY    . CESAREAN SECTION     X 2   Social History  Substance Use Topics  . Smoking status: Never Smoker  . Smokeless tobacco: Never Used  . Alcohol use 0.0 oz/week     Comment: wine -every 3-4 months   Family History  Problem  Relation Age of Onset  . Cardiomyopathy Father   . Diabetes Father   . Stroke Father   . Anxiety disorder Mother   . Lupus Mother   . Emphysema Mother   . Coronary artery disease Mother   . Lung cancer Mother   . Hypertension Mother   . Heart disease Mother   . COPD Mother   . Kidney disease Mother   . Colon cancer Neg Hx   . Breast cancer Neg Hx    Allergies  Allergen Reactions  . Penicillins     REACTION: ? allergy  . Pseudoephedrine     REACTION: makes her hyper   Current Outpatient Prescriptions on File Prior to Visit  Medication Sig Dispense Refill  . Cetirizine HCl 10 MG CAPS Take 1 capsule by mouth daily as needed.     . Triamcinolone Acetonide (NASACORT ALLERGY 24HR NA) Place 2 sprays into the nose daily.     No current facility-administered medications on file prior to visit.     Review of Systems Review of Systems  Constitutional: Negative for fever, appetite  change, fatigue and unexpected weight change.  Eyes: Negative for pain and visual disturbance.  Respiratory: Negative for cough and shortness of breath.   Cardiovascular: Negative for cp or palpitations    Gastrointestinal: Negative for nausea, diarrhea and constipation. pos for bloating/abd discomfort/frequent urgent stools w/o blood or dark color  Genitourinary: Negative for urgency and frequency.  Skin: Negative for pallor or rash   Neurological: Negative for weakness, light-headedness, numbness and headaches.  Hematological: Negative for adenopathy. Does not bruise/bleed easily.  Psychiatric/Behavioral: Negative for dysphoric mood. The patient is not nervous/anxious.         Objective:   Physical Exam  Constitutional: She appears well-developed and well-nourished. No distress.  Well appearing / overwt  HENT:  Head: Normocephalic and atraumatic.  Mouth/Throat: Oropharynx is clear and moist.  Eyes: Pupils are equal, round, and reactive to light. Conjunctivae and EOM are normal. No scleral icterus.  Neck: Normal range of motion. Neck supple.  Cardiovascular: Normal rate, regular rhythm and normal heart sounds.   Pulmonary/Chest: Effort normal and breath sounds normal. No respiratory distress. She has no wheezes. She has no rales.  Abdominal: Soft. Bowel sounds are normal. She exhibits no distension, no abdominal bruit, no pulsatile midline mass and no mass. There is no hepatosplenomegaly. There is no tenderness. There is no rebound, no guarding, no CVA tenderness, no tenderness at McBurney's point and negative Murphy's sign.  No tenderness  Lymphadenopathy:    She has no cervical adenopathy.  Neurological: She is alert. She has normal reflexes. She displays no tremor.  Skin: Skin is warm and dry. No erythema. No pallor.  Psychiatric: She has a normal mood and affect.          Assessment & Plan:   Problem List Items Addressed This Visit      Other   Abdominal bloating     Poss multifactorial with change in diet  Also drinking with straw  Will stop that and carbonation Keep diet journal (? Food sens)  Lab today  May ref to gi  Due colonoscopy in jan       Relevant Orders   CBC with Differential/Platelet   Comprehensive metabolic panel   TSH   Frequent stools    May be due to diet change/not sure  Hx of ibs type symptoms in 2012 with neg w/u Due for screen colonoscopy in jan  Lab today  incl cmet/tsh/cbc Consider return to gi Reassuring exam       Relevant Orders   CBC with Differential/Platelet   Comprehensive metabolic panel   TSH

## 2016-12-24 NOTE — Patient Instructions (Signed)
Labs today  Avoid carbonation  Avoid drinking with a straw  Keep a diet diary = ? If the cereals and whole grain chips or other items may flare this   We may send you back to GI if all is normal  You are due for screening colonoscopy in January

## 2016-12-24 NOTE — Assessment & Plan Note (Signed)
May be due to diet change/not sure  Hx of ibs type symptoms in 2012 with neg w/u Due for screen colonoscopy in jan  Lab today incl cmet/tsh/cbc Consider return to gi Reassuring exam

## 2016-12-24 NOTE — Assessment & Plan Note (Signed)
Poss multifactorial with change in diet  Also drinking with straw  Will stop that and carbonation Keep diet journal (? Food sens)  Lab today  May ref to gi  Due colonoscopy in jan

## 2017-02-09 ENCOUNTER — Encounter: Payer: Self-pay | Admitting: Family Medicine

## 2017-02-09 ENCOUNTER — Ambulatory Visit (INDEPENDENT_AMBULATORY_CARE_PROVIDER_SITE_OTHER): Payer: BC Managed Care – PPO | Admitting: Family Medicine

## 2017-02-09 VITALS — BP 126/68 | HR 89 | Temp 99.1°F | Ht 63.25 in | Wt 164.8 lb

## 2017-02-09 DIAGNOSIS — E785 Hyperlipidemia, unspecified: Secondary | ICD-10-CM

## 2017-02-09 DIAGNOSIS — Z0000 Encounter for general adult medical examination without abnormal findings: Secondary | ICD-10-CM | POA: Diagnosis not present

## 2017-02-09 DIAGNOSIS — R739 Hyperglycemia, unspecified: Secondary | ICD-10-CM

## 2017-02-09 DIAGNOSIS — Z23 Encounter for immunization: Secondary | ICD-10-CM

## 2017-02-09 DIAGNOSIS — K529 Noninfective gastroenteritis and colitis, unspecified: Secondary | ICD-10-CM

## 2017-02-09 DIAGNOSIS — L309 Dermatitis, unspecified: Secondary | ICD-10-CM | POA: Diagnosis not present

## 2017-02-09 LAB — LIPID PANEL
Cholesterol: 212 mg/dL — ABNORMAL HIGH (ref 0–200)
HDL: 72.6 mg/dL (ref >39.00)
LDL Cholesterol: 115 mg/dL — ABNORMAL HIGH (ref 0–99)
NONHDL: 139.83
TRIGLYCERIDES: 124 mg/dL (ref 0.0–149.0)
Total CHOL/HDL Ratio: 3
VLDL: 24.8 mg/dL (ref 0.0–40.0)

## 2017-02-09 LAB — HEMOGLOBIN A1C: Hgb A1c MFr Bld: 5.6 % (ref 4.6–6.5)

## 2017-02-09 MED ORDER — TRIAMCINOLONE ACETONIDE 0.1 % EX CREA: 1.0000 "application " | TOPICAL_CREAM | Freq: Two times a day (BID) | CUTANEOUS | 0 refills | Status: DC

## 2017-02-09 NOTE — Progress Notes (Signed)
Subjective:    Patient ID: Tracy Blankenship, female    DOB: 1955/12/26, 61 y.o.   MRN: [id number]  HPI  Here for health maintenance exam and to review chronic medical problems    Doing ok overall  Husband is getting ready to retire   C/o of some hearing loss ? Wax build up   occ itches around neck  Needs refill triamcinolone 0.1 % for dermatitis  Had bad eczema as a child and teen - severe   Wt Readings from Last 3 Encounters:  02/09/17 164 lb 12 oz (74.7 kg)  12/24/16 164 lb 8 oz (74.6 kg)  02/08/16 162 lb 4 oz (73.6 kg)   28.95 kg/m   Flu shot - will do today  Interested in the shingrix when it is available   Colonoscopy 1/14- 5 y recall   Mammogram 6/18- nl  Self breast exam -no lumps   Pap 9/16 normal  No symptoms /gyn  Tdap 12/13  Still having very frequent stools and bloating  Not quite as bad - but still happening  Will have to go for colonoscopy 1/19    Hx of hyperlipidemia Lab Results  Component Value Date   CHOL 207 (H) 02/01/2016   HDL 54.70 02/01/2016   LDLCALC 131 (H) 02/01/2016   LDLDIRECT 126.5 12/17/2009   TRIG 109.0 02/01/2016   CHOLHDL 4 02/01/2016   Eating better   Lab Results  Component Value Date   HGBA1C 5.5 02/01/2016   Due for labs   Results for orders placed or performed in visit on 12/24/16  CBC with Differential/Platelet  Result Value Ref Range   WBC 4.3 4.0 - 10.5 K/uL   RBC 5.02 3.87 - 5.11 Mil/uL   Hemoglobin 14.7 12.0 - 15.0 g/dL   HCT 44.7 36.0 - 46.0 %   MCV 88.9 78.0 - 100.0 fl   MCHC 32.8 30.0 - 36.0 g/dL   RDW 13.1 11.5 - 15.5 %   Platelets 280.0 150.0 - 400.0 K/uL   Neutrophils Relative % 53.5 43.0 - 77.0 %   Lymphocytes Relative 36.6 12.0 - 46.0 %   Monocytes Relative 6.9 3.0 - 12.0 %   Eosinophils Relative 2.4 0.0 - 5.0 %   Basophils Relative 0.6 0.0 - 3.0 %   Neutro Abs 2.3 1.4 - 7.7 K/uL   Lymphs Abs 1.6 0.7 - 4.0 K/uL   Monocytes Absolute 0.3 0.1 - 1.0 K/uL   Eosinophils Absolute 0.1 0.0 - 0.7  K/uL   Basophils Absolute 0.0 0.0 - 0.1 K/uL  Comprehensive metabolic panel  Result Value Ref Range   Sodium 138 135 - 145 mEq/L   Potassium 3.9 3.5 - 5.1 mEq/L   Chloride 101 96 - 112 mEq/L   CO2 32 19 - 32 mEq/L   Glucose, Bld 97 70 - 99 mg/dL   BUN 12 6 - 23 mg/dL   Creatinine, Ser 0.82 0.40 - 1.20 mg/dL   Total Bilirubin 0.5 0.2 - 1.2 mg/dL   Alkaline Phosphatase 80 39 - 117 U/L   AST 17 0 - 37 U/L   ALT 13 0 - 35 U/L   Total Protein 7.2 6.0 - 8.3 g/dL   Albumin 4.3 3.5 - 5.2 g/dL   Calcium 9.8 8.4 - 10.5 mg/dL   GFR 75.15 >60.00 mL/min  TSH  Result Value Ref Range   TSH 1.09 0.35 - 4.50 uIU/mL    Went to the gym for a while    Patient Active Problem List  Diagnosis Date Noted  . Eczema 02/09/2017  . Frequent stools 12/24/2016  . Abdominal bloating 12/24/2016  . Mild hyperlipidemia 02/08/2016  . Pain in joint, shoulder region 03/06/2014  . Herpes zoster 01/31/2014  . Allergic reaction 11/08/2013  . Vaginal dryness 10/29/2012  . Encounter for routine gynecological examination 04/22/2011  . Routine general medical examination at a health care facility 03/30/2011  . Urinary incontinence 01/07/2011  . ALLERGIC RHINITIS 12/21/2009  . INTERSTITIAL CYSTITIS 12/08/2006  . Hyperglycemia 12/08/2006   Past Medical History:  Diagnosis Date  . Chronic sinusitis   . Hyperglycemia   . IC (interstitial cystitis)   . Plantar fasciitis   . Stress reaction   . Trigger finger    Past Surgical History:  Procedure Laterality Date  . APPENDECTOMY    . CESAREAN SECTION     X 2   Social History  Substance Use Topics  . Smoking status: Never Smoker  . Smokeless tobacco: Never Used  . Alcohol use 0.0 oz/week     Comment: wine -every 3-4 months   Family History  Problem Relation Age of Onset  . Cardiomyopathy Father   . Diabetes Father   . Stroke Father   . Anxiety disorder Mother   . Lupus Mother   . Emphysema Mother   . Coronary artery disease Mother   . Lung  cancer Mother   . Hypertension Mother   . Heart disease Mother   . COPD Mother   . Kidney disease Mother   . Colon cancer Neg Hx   . Breast cancer Neg Hx    Allergies  Allergen Reactions  . Penicillins     REACTION: ? allergy  . Pseudoephedrine     REACTION: makes her hyper   Current Outpatient Prescriptions on File Prior to Visit  Medication Sig Dispense Refill  . Cetirizine HCl 10 MG CAPS Take 1 capsule by mouth daily as needed.     . NONFORMULARY OR COMPOUNDED ITEM Estradiol 2mg /ml, Estriol 2mg /ml : APPLY 2 CLICKS TO ARM EVERY NIGHT AT BEDTIME    . Triamcinolone Acetonide (NASACORT ALLERGY 24HR NA) Place 2 sprays into the nose daily.     No current facility-administered medications on file prior to visit.      Review of Systems  Constitutional: Negative for activity change, appetite change, fatigue, fever and unexpected weight change.  HENT: Positive for hearing loss. Negative for congestion, ear pain, rhinorrhea, sinus pressure and sore throat.   Eyes: Negative for pain, redness and visual disturbance.  Respiratory: Negative for cough, shortness of breath and wheezing.   Cardiovascular: Negative for chest pain and palpitations.  Gastrointestinal: Negative for abdominal pain, blood in stool, constipation and diarrhea.  Endocrine: Negative for polydipsia and polyuria.  Genitourinary: Negative for dysuria, frequency and urgency.  Musculoskeletal: Negative for arthralgias, back pain and myalgias.  Skin: Negative for pallor and rash.       Pos for itching around neck occ    Allergic/Immunologic: Negative for environmental allergies.  Neurological: Negative for dizziness, syncope and headaches.  Hematological: Negative for adenopathy. Does not bruise/bleed easily.  Psychiatric/Behavioral: Negative for decreased concentration and dysphoric mood. The patient is not nervous/anxious.        Objective:   Physical Exam  Constitutional: She appears well-developed and  well-nourished. No distress.  overwt and well app  HENT:  Head: Normocephalic and atraumatic.  Right Ear: External ear normal.  Left Ear: External ear normal.  Mouth/Throat: Oropharynx is clear and moist.  Eyes: Pupils  are equal, round, and reactive to light. Conjunctivae and EOM are normal. No scleral icterus.  Neck: Normal range of motion. Neck supple. No JVD present. Carotid bruit is not present. No thyromegaly present.  Cardiovascular: Normal rate, regular rhythm, normal heart sounds and intact distal pulses.  Exam reveals no gallop.   Pulmonary/Chest: Effort normal and breath sounds normal. No respiratory distress. She has no wheezes. She exhibits no tenderness.  Abdominal: Soft. Bowel sounds are normal. She exhibits no distension, no abdominal bruit and no mass. There is no tenderness.  Genitourinary: No breast swelling, tenderness, discharge or bleeding.  Genitourinary Comments: Breast exam: No mass, nodules, thickening, tenderness, bulging, retraction, inflamation, nipple discharge or skin changes noted.  No axillary or clavicular LA.      Musculoskeletal: Normal range of motion. She exhibits no edema or tenderness.  Lymphadenopathy:    She has no cervical adenopathy.  Neurological: She is alert. She has normal reflexes. No cranial nerve deficit. She exhibits normal muscle tone. Coordination normal.  Skin: Skin is warm and dry. No rash noted. No erythema. No pallor.  No rash on neck today   Some lentigines and skin tags on trunk  Psychiatric: She has a normal mood and affect.          Assessment & Plan:   Problem List Items Addressed This Visit      Musculoskeletal and Integument   Eczema    Worse as a child-now occ outbreak on neck  req 0.1% triamcinolone  Disc avoidance of hot water/harsh chemicals         Other   Frequent stools    slt improvement but still happening  Rev labs  She has colonoscopy due in January  If she wants to go to GI earlier for a consult  she will let us know        Hyperglycemia    Due for A1C  She has been gradually cutting back on sugar  Disc a plan  disc imp of low glycemic diet and wt loss to prevent DM2       Relevant Orders   Hemoglobin A1c   Mild hyperlipidemia    Disc goals for lipids and reasons to control them Rev labs with pt (last year) Labs today-eating better Rev low sat fat diet in detail       Relevant Orders   Lipid panel   Routine general medical examination at a health care facility - Primary    Reviewed health habits including diet and exercise and skin cancer prevention Reviewed appropriate screening tests for age  Also reviewed health mt list, fam hx and immunization status , as well as social and family history   Labs today for lipid/A1C  Prior labs reviewed  Some hearing c/o - does not want to work it up yet (may be etd related and will continue nasal steroid spray)  Colonoscopy due 1/19  Flu shot today

## 2017-02-09 NOTE — Assessment & Plan Note (Signed)
Worse as a child-now occ outbreak on neck  req 0.1% triamcinolone  Disc avoidance of hot water/harsh chemicals

## 2017-02-09 NOTE — Assessment & Plan Note (Signed)
Disc goals for lipids and reasons to control them Rev labs with pt (last year) Labs today-eating better Rev low sat fat diet in detail

## 2017-02-09 NOTE — Patient Instructions (Addendum)
Call if you want to see GI early for your symptoms   Flu shot today    Get back to exercise   Let us know if you want to have a hearing evaluation from audiology   Try triamcinolone cream for your eczema   Labs for cholesterol/sugar today   Gradually cut out all added sugar (tea)

## 2017-02-09 NOTE — Assessment & Plan Note (Signed)
slt improvement but still happening  Rev labs  She has colonoscopy due in January  If she wants to go to GI earlier for a consult she will let us know

## 2017-02-09 NOTE — Assessment & Plan Note (Signed)
Reviewed health habits including diet and exercise and skin cancer prevention Reviewed appropriate screening tests for age  Also reviewed health mt list, fam hx and immunization status , as well as social and family history   Labs today for lipid/A1C  Prior labs reviewed  Some hearing c/o - does not want to work it up yet (may be etd related and will continue nasal steroid spray)  Colonoscopy due 1/19  Flu shot today

## 2017-02-09 NOTE — Assessment & Plan Note (Signed)
Due for A1C  She has been gradually cutting back on sugar  Disc a plan  disc imp of low glycemic diet and wt loss to prevent DM2

## 2017-06-30 ENCOUNTER — Encounter: Payer: Self-pay | Admitting: Internal Medicine

## 2017-07-09 ENCOUNTER — Encounter: Payer: Self-pay | Admitting: Internal Medicine

## 2017-07-10 ENCOUNTER — Ambulatory Visit: Payer: BC Managed Care – PPO | Admitting: Primary Care

## 2017-07-10 ENCOUNTER — Encounter: Payer: Self-pay | Admitting: Primary Care

## 2017-07-10 VITALS — BP 124/74 | HR 79 | Temp 98.1°F | Ht 63.25 in | Wt 164.0 lb

## 2017-07-10 DIAGNOSIS — R21 Rash and other nonspecific skin eruption: Secondary | ICD-10-CM

## 2017-07-10 MED ORDER — PREDNISONE 10 MG PO TABS: ORAL_TABLET | ORAL | 0 refills | Status: DC

## 2017-07-10 NOTE — Patient Instructions (Signed)
Try using Gold Bond Powder once to twice daily to prevent moisture.  Start prednisone tablets if no improvement after Gold Bond Powder. Take three tablets for 2 days, then two tablets for 2 days, then one tablet for 2 days.  Continue Zyrtec.  It was a pleasure meeting you!

## 2017-07-10 NOTE — Progress Notes (Addendum)
Subjective:    Patient ID: Tracy Blankenship, female    DOB: 20-Aug-1955, 62 y.o.   MRN: [id number]  HPI  Ms. Mareno is a 62 year old female with a history of sensitive skin, recurrent rashes who presents today with a chief complaint of rash.   Her rash is located in between her bilateral breasts which she first noticed 2-3 weeks ago.   She denies itching, mostly burning. She's been using OTC hydrocortisone and triamcinolone 0.1% cream without improvement. She denies changes in soaps/detergents/food, denies new clothing, excessive sweating. No one else in her household is itching.  She did stop taking a compounded hormone treatment for hot flashes about one month ago.    Review of Systems  Constitutional: Negative for fever.  Skin: Positive for rash.       Past Medical History:  Diagnosis Date  . Chronic sinusitis   . Hyperglycemia   . IC (interstitial cystitis)   . Plantar fasciitis   . Stress reaction   . Trigger finger      Social History   Socioeconomic History  . Marital status: Married    Spouse name: Not on file  . Number of children: 2  . Years of education: Not on file  . Highest education level: Not on file  Social Needs  . Financial resource strain: Not on file  . Food insecurity - worry: Not on file  . Food insecurity - inability: Not on file  . Transportation needs - medical: Not on file  . Transportation needs - non-medical: Not on file  Occupational History  . Occupation: Product manager: [employer]  Tobacco Use  . Smoking status: Never Smoker  . Smokeless tobacco: Never Used  Substance and Sexual Activity  . Alcohol use: Yes    Alcohol/week: 0.0 oz    Comment: wine -every 3-4 months  . Drug use: No  . Sexual activity: Not on file  Other Topics Concern  . Not on file  Social History Narrative  . Not on file    Past Surgical History:  Procedure Laterality Date  . APPENDECTOMY    . CESAREAN SECTION     X 2    Family  History  Problem Relation Age of Onset  . Cardiomyopathy Father   . Diabetes Father   . Stroke Father   . Anxiety disorder Mother   . Lupus Mother   . Emphysema Mother   . Coronary artery disease Mother   . Lung cancer Mother   . Hypertension Mother   . Heart disease Mother   . COPD Mother   . Kidney disease Mother   . Colon cancer Neg Hx   . Breast cancer Neg Hx     Allergies  Allergen Reactions  . Claritin [Loratadine] Other (See Comments)    Felt jittery  . Penicillins     REACTION: ? allergy  . Pseudoephedrine     REACTION: makes her hyper    Current Outpatient Medications on File Prior to Visit  Medication Sig Dispense Refill  . Cetirizine HCl 10 MG CAPS Take 1 capsule by mouth daily as needed.     . Triamcinolone Acetonide (NASACORT ALLERGY 24HR NA) Place 2 sprays into the nose daily.    Marland Kitchen triamcinolone cream (KENALOG) 0.1 % Apply 1 application topically 2 (two) times daily. 30 g 0   No current facility-administered medications on file prior to visit.     BP 124/74   Pulse 79  Temp 98.1 F (36.7 C) (Oral)   Ht 5' 3.25" (1.607 m)   Wt 164 lb (74.4 kg)   SpO2 97%   BMI 28.82 kg/m    Objective:   Physical Exam  Constitutional: She appears well-nourished.  Cardiovascular: Normal rate and regular rhythm.  Pulmonary/Chest: Effort normal and breath sounds normal.  Skin: Skin is warm and dry.  Small raised red bumps to mid/lower chest in between breasts. No scaling, no vesicles, intact. No rash elsewhere.           Assessment & Plan:  Heat Rash:  Rash in between bilateral breasts x 2-3 weeks. Rash today representative of heat rash from sweating or moisture, body acne. No evidence of scabies or bedbugs, shingles, contact dermatitis. Will have her start with Gold Bond Powder to prevent moisture build up. Discussed use of benzyl peroxide wash. Rx for prednisone provided for her to use if no improvement. She will update.  Pleas Koch, NP

## 2017-08-03 ENCOUNTER — Telehealth: Payer: Self-pay | Admitting: Family Medicine

## 2017-08-03 NOTE — Telephone Encounter (Signed)
Pt notified of Dr. Marliss Coots comments and verbalized understanding. She said her eyes are not to bad right now so she will try the OTC eye drops and if sxs worsen she will call back for an appt

## 2017-08-03 NOTE — Telephone Encounter (Signed)
Copied from [place]. Topic: Quick Communication - See Telephone Encounter >> Aug 03, 2017  8:44 AM Bea Graff, NT wrote: CRM for notification. See Telephone encounter for: Pt states she thinks she has pink eye and would like to see if the drops can be called in? She states she wouldn't like to come in for an appt due to all the pts with the flu coming in. Uses [facility] on [address].   08/03/17.

## 2017-08-03 NOTE — Telephone Encounter (Signed)
Conjunctivitis can be viral/bacterial or allergic and they are all treated differently so I cannot tx w/o seeing her   Eye drops otc (saline/lubricant) can be helpful  Hold off wearing contacts if she wears them   If pain /visoin change-more concerning

## 2017-08-18 ENCOUNTER — Other Ambulatory Visit: Payer: Self-pay

## 2017-08-18 ENCOUNTER — Ambulatory Visit (AMBULATORY_SURGERY_CENTER): Payer: Self-pay | Admitting: *Deleted

## 2017-08-18 VITALS — Ht 63.0 in | Wt 163.0 lb

## 2017-08-18 DIAGNOSIS — Z86010 Personal history of colonic polyps: Secondary | ICD-10-CM

## 2017-08-18 MED ORDER — NA SULFATE-K SULFATE-MG SULF 17.5-3.13-1.6 GM/177ML PO SOLN: 1.0000 | Freq: Once | ORAL | 0 refills | Status: AC

## 2017-08-18 NOTE — Progress Notes (Signed)
No egg or soy allergy known to patient  No issues with past sedation with any surgeries  or procedures, no intubation problems  No diet pills per patient No home 02 use per patient  No blood thinners per patient  Pt denies issues with constipation  No A fib or A flutter  EMMI video sent to pt's e mail pt declined   

## 2017-08-28 ENCOUNTER — Encounter: Payer: Self-pay | Admitting: Internal Medicine

## 2017-09-09 ENCOUNTER — Other Ambulatory Visit: Payer: Self-pay

## 2017-09-09 ENCOUNTER — Encounter: Payer: Self-pay | Admitting: Internal Medicine

## 2017-09-09 ENCOUNTER — Ambulatory Visit (AMBULATORY_SURGERY_CENTER): Payer: BC Managed Care – PPO | Admitting: Internal Medicine

## 2017-09-09 VITALS — BP 117/57 | HR 67 | Temp 98.4°F | Resp 15 | Ht 63.0 in | Wt 163.0 lb

## 2017-09-09 DIAGNOSIS — Z1211 Encounter for screening for malignant neoplasm of colon: Secondary | ICD-10-CM

## 2017-09-09 DIAGNOSIS — Z86010 Personal history of colonic polyps: Secondary | ICD-10-CM | POA: Diagnosis not present

## 2017-09-09 DIAGNOSIS — D123 Benign neoplasm of transverse colon: Secondary | ICD-10-CM

## 2017-09-09 MED ORDER — SODIUM CHLORIDE 0.9 % IV SOLN: 500.0000 mL | Freq: Once | INTRAVENOUS | Status: DC

## 2017-09-09 NOTE — Op Note (Signed)
[facility] Patient Name: Tracy Blankenship Procedure Date: 09/09/2017 10:28 AM MRN: [id number] Endoscopist: Jerene Bears , MD Age: 62 Referring MD:  Date of Birth: 1955-11-01 Gender: Female Account #: [id number] Procedure:                Colonoscopy Indications:              Surveillance: Personal history of adenomatous                            polyps on last colonoscopy 5 years ago Medicines:                Monitored Anesthesia Care Procedure:                Pre-Anesthesia Assessment:                           - Prior to the procedure, a History and Physical                            was performed, and patient medications and                            allergies were reviewed. The patient's tolerance of                            previous anesthesia was also reviewed. The risks                            and benefits of the procedure and the sedation                            options and risks were discussed with the patient.                            All questions were answered, and informed consent                            was obtained. Prior Anticoagulants: The patient has                            taken no previous anticoagulant or antiplatelet                            agents. ASA Grade Assessment: II - A patient with                            mild systemic disease. After reviewing the risks                            and benefits, the patient was deemed in                            satisfactory condition to undergo the procedure.  After obtaining informed consent, the colonoscope                            was passed under direct vision. Throughout the                            procedure, the patient's blood pressure, pulse, and                            oxygen saturations were monitored continuously. The                            Colonoscope was introduced through the anus and                            advanced to the the cecum,  identified by                            appendiceal orifice and ileocecal valve. The                            colonoscopy was performed without difficulty. The                            patient tolerated the procedure well. The quality                            of the bowel preparation was good. The ileocecal                            valve, appendiceal orifice, and rectum were                            photographed. Scope In: 10:38:21 AM Scope Out: 10:51:48 AM Scope Withdrawal Time: 0 hours 10 minutes 40 seconds  Total Procedure Duration: 0 hours 13 minutes 27 seconds  Findings:                 The digital rectal exam was normal.                           A 3 mm polyp was found in the transverse colon. The                            polyp was sessile. The polyp was removed with a                            cold snare. Resection and retrieval were complete.                           A few small-mouthed diverticula were found in the                            sigmoid colon.  The retroflexed view of the distal rectum and anal                            verge was normal and showed no anal or rectal                            abnormalities. Complications:            No immediate complications. Estimated Blood Loss:     Estimated blood loss was minimal. Impression:               - One 3 mm polyp in the transverse colon, removed                            with a cold snare. Resected and retrieved.                           - Diverticulosis in the sigmoid colon.                           - The distal rectum and anal verge are normal on                            retroflexion view. Recommendation:           - Patient has a contact number available for                            emergencies. The signs and symptoms of potential                            delayed complications were discussed with the                            patient. Return to normal activities  tomorrow.                            Written discharge instructions were provided to the                            patient.                           - Resume previous diet.                           - Continue present medications.                           - Await pathology results.                           - Repeat colonoscopy is recommended for                            surveillance. The colonoscopy date will be  determined after pathology results from today's                            exam become available for review. Jerene Bears, MD 09/09/2017 10:54:08 AM This report has been signed electronically.

## 2017-09-09 NOTE — Patient Instructions (Signed)
**   Handouts given on polyps and diverticulosis **   YOU HAD AN ENDOSCOPIC PROCEDURE TODAY AT THE [facility]:   Refer to the procedure report that was given to you for any specific questions about what was found during the examination.  If the procedure report does not answer your questions, please call your gastroenterologist to clarify.  If you requested that your care partner not be given the details of your procedure findings, then the procedure report has been included in a sealed envelope for you to review at your convenience later.  YOU SHOULD EXPECT: Some feelings of bloating in the abdomen. Passage of more gas than usual.  Walking can help get rid of the air that was put into your GI tract during the procedure and reduce the bloating. If you had a lower endoscopy (such as a colonoscopy or flexible sigmoidoscopy) you may notice spotting of blood in your stool or on the toilet paper. If you underwent a bowel prep for your procedure, you may not have a normal bowel movement for a few days.  Please Note:  You might notice some irritation and congestion in your nose or some drainage.  This is from the oxygen used during your procedure.  There is no need for concern and it should clear up in a day or so.  SYMPTOMS TO REPORT IMMEDIATELY:   Following lower endoscopy (colonoscopy or flexible sigmoidoscopy):  Excessive amounts of blood in the stool  Significant tenderness or worsening of abdominal pains  Swelling of the abdomen that is new, acute  Fever of 100F or higher  For urgent or emergent issues, a gastroenterologist can be reached at any hour by calling [phone].   DIET:  We do recommend a small meal at first, but then you may proceed to your regular diet.  Drink plenty of fluids but you should avoid alcoholic beverages for 24 hours.  ACTIVITY:  You should plan to take it easy for the rest of today and you should NOT DRIVE or use heavy machinery until tomorrow (because  of the sedation medicines used during the test).    FOLLOW UP: Our staff will call the number listed on your records the next business day following your procedure to check on you and address any questions or concerns that you may have regarding the information given to you following your procedure. If we do not reach you, we will leave a message.  However, if you are feeling well and you are not experiencing any problems, there is no need to return our call.  We will assume that you have returned to your regular daily activities without incident.  If any biopsies were taken you will be contacted by phone or by letter within the next 1-3 weeks.  Please call us at [phone] if you have not heard about the biopsies in 3 weeks.    SIGNATURES/CONFIDENTIALITY: You and/or your care partner have signed paperwork which will be entered into your electronic medical record.  These signatures attest to the fact that that the information above on your After Visit Summary has been reviewed and is understood.  Full responsibility of the confidentiality of this discharge information lies with you and/or your care-partner. 

## 2017-09-09 NOTE — Progress Notes (Signed)
A and O x3. Report to RN. Tolerated MAC anesthesia well.

## 2017-09-09 NOTE — Progress Notes (Signed)
Pt's states no medical or surgical changes since previsit or office visit. 

## 2017-09-09 NOTE — Progress Notes (Signed)
Called to room to assist during endoscopic procedure.  Patient ID and intended procedure confirmed with present staff. Received instructions for my participation in the procedure from the performing physician.  

## 2017-09-10 ENCOUNTER — Telehealth: Payer: Self-pay | Admitting: *Deleted

## 2017-09-10 NOTE — Telephone Encounter (Signed)
  Follow up Call-  Call back number 09/09/2017  Post procedure Call Back phone  # [phone]  Permission to leave phone message Yes  Some recent data might be hidden     Patient questions:  Do you have a fever, pain , or abdominal swelling? No. Pain Score  0 *  Have you tolerated food without any problems? Yes.    Have you been able to return to your normal activities? Yes.    Do you have any questions about your discharge instructions: Diet   No. Medications  No. Follow up visit  No.  Do you have questions or concerns about your Care? No.  Actions: * If pain score is 4 or above: No action needed, pain <4.

## 2017-09-16 ENCOUNTER — Encounter: Payer: Self-pay | Admitting: Internal Medicine

## 2017-10-08 ENCOUNTER — Other Ambulatory Visit: Payer: Self-pay | Admitting: Family Medicine

## 2017-10-08 DIAGNOSIS — Z1231 Encounter for screening mammogram for malignant neoplasm of breast: Secondary | ICD-10-CM

## 2017-11-06 ENCOUNTER — Encounter (HOSPITAL_BASED_OUTPATIENT_CLINIC_OR_DEPARTMENT_OTHER): Payer: Self-pay | Admitting: *Deleted

## 2017-11-06 ENCOUNTER — Other Ambulatory Visit: Payer: Self-pay

## 2017-11-06 ENCOUNTER — Emergency Department (HOSPITAL_BASED_OUTPATIENT_CLINIC_OR_DEPARTMENT_OTHER)
Admission: EM | Admit: 2017-11-06 | Discharge: 2017-11-06 | Disposition: A | Discharge status: Home / Self Care | Payer: BC Managed Care – PPO | Attending: Emergency Medicine | Admitting: Emergency Medicine

## 2017-11-06 ENCOUNTER — Ambulatory Visit: Payer: Self-pay | Admitting: *Deleted

## 2017-11-06 DIAGNOSIS — R112 Nausea with vomiting, unspecified: Secondary | ICD-10-CM | POA: Diagnosis not present

## 2017-11-06 DIAGNOSIS — R197 Diarrhea, unspecified: Secondary | ICD-10-CM | POA: Insufficient documentation

## 2017-11-06 DIAGNOSIS — R1110 Vomiting, unspecified: Secondary | ICD-10-CM | POA: Diagnosis present

## 2017-11-06 LAB — CBC WITH DIFFERENTIAL/PLATELET
Basophils Absolute: 0 10*3/uL (ref 0.0–0.1)
Basophils Relative: 0 %
EOS ABS: 0 10*3/uL (ref 0.0–0.7)
EOS PCT: 0 %
HCT: 45.8 % (ref 36.0–46.0)
Hemoglobin: 15.7 g/dL — ABNORMAL HIGH (ref 12.0–15.0)
LYMPHS ABS: 0.2 10*3/uL — AB (ref 0.7–4.0)
Lymphocytes Relative: 2 %
MCH: 29 pg (ref 26.0–34.0)
MCHC: 34.3 g/dL (ref 30.0–36.0)
MCV: 84.5 fL (ref 78.0–100.0)
MONOS PCT: 3 %
Monocytes Absolute: 0.3 10*3/uL (ref 0.1–1.0)
NEUTROS PCT: 95 %
Neutro Abs: 9.6 10*3/uL — ABNORMAL HIGH (ref 1.7–7.7)
Platelets: 234 10*3/uL (ref 150–400)
RBC: 5.42 MIL/uL — ABNORMAL HIGH (ref 3.87–5.11)
RDW: 13.4 % (ref 11.5–15.5)
WBC: 10.1 10*3/uL (ref 4.0–10.5)

## 2017-11-06 LAB — COMPREHENSIVE METABOLIC PANEL
ALBUMIN: 4.5 g/dL (ref 3.5–5.0)
ALK PHOS: 111 U/L (ref 38–126)
ALT: 24 U/L (ref 14–54)
AST: 27 U/L (ref 15–41)
Anion gap: 10 (ref 5–15)
BUN: 20 mg/dL (ref 6–20)
CALCIUM: 9 mg/dL (ref 8.9–10.3)
CO2: 25 mmol/L (ref 22–32)
CREATININE: 0.83 mg/dL (ref 0.44–1.00)
Chloride: 103 mmol/L (ref 101–111)
GFR calc Af Amer: >60 mL/min (ref >60)
GFR calc non Af Amer: >60 mL/min (ref >60)
GLUCOSE: 137 mg/dL — AB (ref 65–99)
Potassium: 3.7 mmol/L (ref 3.5–5.1)
SODIUM: 138 mmol/L (ref 135–145)
Total Bilirubin: 0.7 mg/dL (ref 0.3–1.2)
Total Protein: 8 g/dL (ref 6.5–8.1)

## 2017-11-06 LAB — LIPASE, BLOOD: Lipase: 28 U/L (ref 11–51)

## 2017-11-06 MED ORDER — SODIUM CHLORIDE 0.9 % IV SOLN: INTRAVENOUS | Status: DC

## 2017-11-06 MED ORDER — ONDANSETRON 8 MG PO TBDP: 8.0000 mg | ORAL_TABLET | Freq: Three times a day (TID) | ORAL | 0 refills | Status: DC | PRN

## 2017-11-06 MED ORDER — LOPERAMIDE HCL 2 MG PO CAPS: 2.0000 mg | ORAL_CAPSULE | Freq: Four times a day (QID) | ORAL | 0 refills | Status: DC | PRN

## 2017-11-06 MED ORDER — ONDANSETRON HCL 4 MG/2ML IJ SOLN
4.0000 mg | Freq: Once | INTRAMUSCULAR | Status: AC
  Administered 2017-11-06: 4 mg via INTRAVENOUS
  Filled 2017-11-06: qty 2

## 2017-11-06 MED ORDER — SODIUM CHLORIDE 0.9 % IV BOLUS
1000.0000 mL | Freq: Once | INTRAVENOUS | Status: AC
  Administered 2017-11-06: 1000 mL via INTRAVENOUS

## 2017-11-06 NOTE — Telephone Encounter (Signed)
Patient has diarrhea vomiting and abd cramps that began this am.Unable to hold down any liquids. Pt reports decreased urinary output. Pt states feels some weak as well. Pt advised to go to the Er - she states she will go to [facility].Pt advised not to drive - she states her husband will take her.  Reason for Disposition . [1] SEVERE diarrhea (e.g., 7 or more times / day more than normal) AND [2]  age > 60 years  Answer Assessment - Initial Assessment Questions 1. DIARRHEA SEVERITY: "How bad is the diarrhea?" "How many extra stools have you had in the past 24 hours than normal?"    - MILD: Few loose or mushy BMs; increase of 1-3 stools over normal daily number of stools; mild increase in ostomy output.   - MODERATE: Increase of 4-6 stools daily over normal; moderate increase in ostomy output.   - SEVERE (or Worst Possible): Increase of 7 or more stools daily over normal; moderate increase in ostomy output; incontinence.      20   2. ONSET: "When did the diarrhea begin?"       11 hours    3. BM CONSISTENCY: "How loose or watery is the diarrhea?"        Watery   4. VOMITING: "Are you also vomiting?" If so, ask: "How many times in the past 24 hours?"         8    5. ABDOMINAL PAIN: "Are you having any abdominal pain?" If yes: "What does it feel like?" (e.g., crampy, dull, intermittent, constant)          Yes  abd cramping   6. ABDOMINAL PAIN SEVERITY: If present, ask: "How bad is the pain?"  (e.g., Scale 1-10; mild, moderate, or severe)    - MILD (1-3): doesn't interfere with normal activities, abdomen soft and not tender to touch     - MODERATE (4-7): interferes with normal activities or awakens from sleep, tender to touch     - SEVERE (8-10): excruciating pain, doubled over, unable to do any normal activities          Mild - Moderate  7. ORAL INTAKE: If vomiting, "Have you been able to drink liquids?" "How much fluids have you had in the past 24 hours?"         No  Everything   coming back   8. HYDRATION: "Any signs of dehydration?" (e.g., dry mouth [not just dry lips], too weak to stand, dizziness, new weight loss) "When did you last urinate?"        dry  mouth weak decreased  last urinary output about 45  mins ago   9. EXPOSURE: "Have you traveled to a foreign country recently?" "Have you been exposed to anyone with diarrhea?" "Could you have eaten any food that was spoiled?" Nothing out of the ordinary           10. OTHER SYMPTOMS: "Do you have any other symptoms?" (e.g., fever, blood in stool)      Low grade temp  Temp of 99.6  Orally   11. PREGNANCY: "Is there any chance you are pregnant?" "When was your last menstrual period?"       N/a  Protocols used: DIARRHEA-A-AH

## 2017-11-06 NOTE — Discharge Instructions (Addendum)
Take the medications for the vomiting and diarrhea.  Try to drink small frequent amounts of fluids.  Your symptoms should be improving in the next 24 to 48 hours.  Return to the ED as needed for worsening symptoms.

## 2017-11-06 NOTE — ED Provider Notes (Signed)
[facility] EMERGENCY DEPARTMENT Provider Note   CSN: [id number] Arrival date & time: 11/06/17  1733     History   Chief Complaint Chief Complaint  Patient presents with  . Emesis  . Diarrhea    HPI Tracy Blankenship is a 62 y.o. female.  HPI Patient presents to the emergency room for evaluation of vomiting and diarrhea.  Patient states her symptoms started this morning at about 6 AM.  Initially she started having multiple episodes of diarrhea.  Patient states the stool is loose and watery.  She feels like she is going to the bathroom just as much as she did when she did a bowel prep for colonoscopy.  Later in the day the patient started having vomiting.  She has not been able to keep anything down.  She feels like she is getting dehydrated.  She has some abdominal cramping but no discrete areas of pain or tenderness. Past Medical History:  Diagnosis Date  . Allergy   . Chronic sinusitis   . Hyperglycemia   . IC (interstitial cystitis)   . Plantar fasciitis   . Stress reaction   . Trigger finger     Patient Active Problem List   Diagnosis Date Noted  . Eczema 02/09/2017  . Frequent stools 12/24/2016  . Abdominal bloating 12/24/2016  . Mild hyperlipidemia 02/08/2016  . Pain in joint, shoulder region 03/06/2014  . Herpes zoster 01/31/2014  . Allergic reaction 11/08/2013  . Vaginal dryness 10/29/2012  . Encounter for routine gynecological examination 04/22/2011  . Routine general medical examination at a health care facility 03/30/2011  . Urinary incontinence 01/07/2011  . ALLERGIC RHINITIS 12/21/2009  . INTERSTITIAL CYSTITIS 12/08/2006  . Hyperglycemia 12/08/2006    Past Surgical History:  Procedure Laterality Date  . APPENDECTOMY    . CESAREAN SECTION     X 2  . WISDOM TOOTH EXTRACTION       OB History   None      Home Medications    Prior to Admission medications   Medication Sig Start Date End Date Taking? Authorizing Provider  Cetirizine  HCl 10 MG CAPS Take 1 capsule by mouth daily as needed.     [provider]  loperamide (IMODIUM) 2 MG capsule Take 1 capsule (2 mg total) by mouth 4 (four) times daily as needed for diarrhea or loose stools. 11/06/17   Dorie Rank, MD  ondansetron (ZOFRAN ODT) 8 MG disintegrating tablet Take 1 tablet (8 mg total) by mouth every 8 (eight) hours as needed for nausea or vomiting. 11/06/17   Dorie Rank, MD  Triamcinolone Acetonide (NASACORT ALLERGY 24HR NA) Place 2 sprays into the nose daily.    [provider]  triamcinolone cream (KENALOG) 0.1 % Apply 1 application topically 2 (two) times daily. Patient not taking: Reported on 09/09/2017 02/09/17   Abner Greenspan, MD    Family History Family History  Problem Relation Age of Onset  . Cardiomyopathy Father   . Diabetes Father   . Stroke Father   . Anxiety disorder Mother   . Lupus Mother   . Emphysema Mother   . Coronary artery disease Mother   . Lung cancer Mother   . Hypertension Mother   . Heart disease Mother   . COPD Mother   . Kidney disease Mother   . Colon cancer Neg Hx   . Breast cancer Neg Hx   . Colon polyps Neg Hx   . Esophageal cancer Neg Hx   .  Rectal cancer Neg Hx   . Stomach cancer Neg Hx     Social History Social History   Tobacco Use  . Smoking status: Never Smoker  . Smokeless tobacco: Never Used  Substance Use Topics  . Alcohol use: Yes    Alcohol/week: 0.0 oz    Comment: wine -every 3-4 months  . Drug use: No     Allergies   Claritin [loratadine]; Penicillins; and Pseudoephedrine   Review of Systems Review of Systems  All other systems reviewed and are negative.    Physical Exam Updated Vital Signs BP (!) 151/66   Pulse 98   Temp 99.3 F (37.4 C) (Oral)   Resp 18   Ht 1.6 m (5\' 3" )   Wt 71.2 kg (157 lb)   SpO2 95%   BMI 27.81 kg/m   Physical Exam  Constitutional: She appears well-developed and well-nourished. No distress.  HENT:  Head: Normocephalic and atraumatic.    Right Ear: External ear normal.  Left Ear: External ear normal.  Eyes: Conjunctivae are normal. Right eye exhibits no discharge. Left eye exhibits no discharge. No scleral icterus.  Neck: Neck supple. No tracheal deviation present.  Cardiovascular: Normal rate, regular rhythm and intact distal pulses.  Pulmonary/Chest: Effort normal and breath sounds normal. No stridor. No respiratory distress. She has no wheezes. She has no rales.  Abdominal: Soft. Bowel sounds are normal. She exhibits no distension. There is no tenderness. There is no rebound and no guarding.  Musculoskeletal: She exhibits no edema or tenderness.  Neurological: She is alert. She has normal strength. No cranial nerve deficit (no facial droop, extraocular movements intact, no slurred speech) or sensory deficit. She exhibits normal muscle tone. She displays no seizure activity. Coordination normal.  Skin: Skin is warm and dry. No rash noted.  Psychiatric: She has a normal mood and affect.  Nursing note and vitals reviewed.    ED Treatments / Results  Labs (all labs ordered are listed, but only abnormal results are displayed) Labs Reviewed  COMPREHENSIVE METABOLIC PANEL - Abnormal; Notable for the following components:      Result Value   Glucose, Bld 137 (*)    All other components within normal limits  CBC WITH DIFFERENTIAL/PLATELET - Abnormal; Notable for the following components:   RBC 5.42 (*)    Hemoglobin 15.7 (*)    Neutro Abs 9.6 (*)    Lymphs Abs 0.2 (*)    All other components within normal limits  LIPASE, BLOOD    EKG None  Radiology No results found.  Procedures Procedures (including critical care time)  Medications Ordered in ED Medications  sodium chloride 0.9 % bolus 1,000 mL (0 mLs Intravenous Stopped 11/06/17 1909)  ondansetron (ZOFRAN) injection 4 mg (4 mg Intravenous Given 11/06/17 1810)     Initial Impression / Assessment and Plan / ED Course  I have reviewed the triage vital signs  and the nursing notes.  Pertinent labs & imaging results that were available during my care of the patient were reviewed by me and considered in my medical decision making (see chart for details).   Labs reassuring.  Likely viral illness.    Pt treated with IV fluids and antiemetics.  Feeling much better after treatment.  At this time there does not appear to be any evidence of an acute emergency medical condition and the patient appears stable for discharge with appropriate outpatient follow up.   Final Clinical Impressions(s) / ED Diagnoses   Final diagnoses:  Nausea  vomiting and diarrhea    ED Discharge Orders        Ordered    ondansetron (ZOFRAN ODT) 8 MG disintegrating tablet  Every 8 hours PRN     11/06/17 1913    loperamide (IMODIUM) 2 MG capsule  4 times daily PRN     11/06/17 1913       Dorie Rank, MD 11/07/17 0020

## 2017-11-06 NOTE — ED Notes (Signed)
Pt understood dc material. NAD noted. SCirpts given at [employer]

## 2017-11-06 NOTE — ED Triage Notes (Signed)
Vomiting and diarrhea since 6am. Aching all over.

## 2017-11-09 NOTE — Telephone Encounter (Signed)
Will watch for records, thanks

## 2017-12-07 ENCOUNTER — Ambulatory Visit
Admission: RE | Admit: 2017-12-07 | Discharge: 2017-12-07 | Disposition: A | Discharge status: Home / Self Care | Payer: BC Managed Care – PPO | Source: Ambulatory Visit | Attending: Family Medicine | Admitting: Family Medicine

## 2017-12-07 DIAGNOSIS — Z1231 Encounter for screening mammogram for malignant neoplasm of breast: Secondary | ICD-10-CM

## 2018-02-04 ENCOUNTER — Telehealth: Payer: Self-pay | Admitting: Family Medicine

## 2018-02-04 DIAGNOSIS — E785 Hyperlipidemia, unspecified: Secondary | ICD-10-CM

## 2018-02-04 DIAGNOSIS — Z0000 Encounter for general adult medical examination without abnormal findings: Secondary | ICD-10-CM

## 2018-02-04 DIAGNOSIS — R7303 Prediabetes: Secondary | ICD-10-CM

## 2018-02-04 NOTE — Telephone Encounter (Signed)
-----   Message from Lendon Collar, RT sent at 02/01/2018  1:34 PM EDT ----- Regarding: Lab orders for Thursday 02/11/18 Please enter CPE lab orders for 02/11/18. Thanks!

## 2018-02-11 ENCOUNTER — Other Ambulatory Visit (INDEPENDENT_AMBULATORY_CARE_PROVIDER_SITE_OTHER): Payer: BC Managed Care – PPO

## 2018-02-11 DIAGNOSIS — R7303 Prediabetes: Secondary | ICD-10-CM | POA: Diagnosis not present

## 2018-02-11 DIAGNOSIS — E785 Hyperlipidemia, unspecified: Secondary | ICD-10-CM | POA: Diagnosis not present

## 2018-02-11 DIAGNOSIS — Z0000 Encounter for general adult medical examination without abnormal findings: Secondary | ICD-10-CM | POA: Diagnosis not present

## 2018-02-11 LAB — CBC WITH DIFFERENTIAL/PLATELET
BASOS ABS: 0 10*3/uL (ref 0.0–0.1)
Basophils Relative: 0.4 % (ref 0.0–3.0)
Eosinophils Absolute: 0.2 10*3/uL (ref 0.0–0.7)
Eosinophils Relative: 3.8 % (ref 0.0–5.0)
HCT: 41.7 % (ref 36.0–46.0)
Hemoglobin: 14.2 g/dL (ref 12.0–15.0)
LYMPHS PCT: 37.8 % (ref 12.0–46.0)
Lymphs Abs: 1.8 10*3/uL (ref 0.7–4.0)
MCHC: 34.1 g/dL (ref 30.0–36.0)
MCV: 85.6 fl (ref 78.0–100.0)
MONOS PCT: 7.8 % (ref 3.0–12.0)
Monocytes Absolute: 0.4 10*3/uL (ref 0.1–1.0)
Neutro Abs: 2.4 10*3/uL (ref 1.4–7.7)
Neutrophils Relative %: 50.2 % (ref 43.0–77.0)
Platelets: 281 10*3/uL (ref 150.0–400.0)
RBC: 4.87 Mil/uL (ref 3.87–5.11)
RDW: 13.6 % (ref 11.5–15.5)
WBC: 4.7 10*3/uL (ref 4.0–10.5)

## 2018-02-11 LAB — COMPREHENSIVE METABOLIC PANEL
ALK PHOS: 80 U/L (ref 39–117)
ALT: 13 U/L (ref 0–35)
AST: 17 U/L (ref 0–37)
Albumin: 4.2 g/dL (ref 3.5–5.2)
BILIRUBIN TOTAL: 0.5 mg/dL (ref 0.2–1.2)
BUN: 16 mg/dL (ref 6–23)
CALCIUM: 9.6 mg/dL (ref 8.4–10.5)
CO2: 30 mEq/L (ref 19–32)
Chloride: 105 mEq/L (ref 96–112)
Creatinine, Ser: 0.8 mg/dL (ref 0.40–1.20)
GFR: 77.04 mL/min (ref >60.00)
GLUCOSE: 90 mg/dL (ref 70–99)
Potassium: 4 mEq/L (ref 3.5–5.1)
Sodium: 142 mEq/L (ref 135–145)
TOTAL PROTEIN: 7.1 g/dL (ref 6.0–8.3)

## 2018-02-11 LAB — LIPID PANEL
CHOLESTEROL: 178 mg/dL (ref 0–200)
HDL: 51.9 mg/dL (ref >39.00)
LDL Cholesterol: 108 mg/dL — ABNORMAL HIGH (ref 0–99)
NonHDL: 126.55
TRIGLYCERIDES: 91 mg/dL (ref 0.0–149.0)
Total CHOL/HDL Ratio: 3
VLDL: 18.2 mg/dL (ref 0.0–40.0)

## 2018-02-11 LAB — HEMOGLOBIN A1C: Hgb A1c MFr Bld: 5.6 % (ref 4.6–6.5)

## 2018-02-11 LAB — TSH: TSH: 1.12 u[IU]/mL (ref 0.35–4.50)

## 2018-02-15 ENCOUNTER — Other Ambulatory Visit (HOSPITAL_COMMUNITY)
Admission: RE | Admit: 2018-02-15 | Discharge: 2018-02-15 | Disposition: A | Discharge status: Home / Self Care | Payer: BC Managed Care – PPO | Source: Ambulatory Visit | Attending: Family Medicine | Admitting: Family Medicine

## 2018-02-15 ENCOUNTER — Ambulatory Visit (INDEPENDENT_AMBULATORY_CARE_PROVIDER_SITE_OTHER): Payer: BC Managed Care – PPO | Admitting: Family Medicine

## 2018-02-15 ENCOUNTER — Encounter: Payer: Self-pay | Admitting: Family Medicine

## 2018-02-15 VITALS — BP 136/80 | HR 78 | Temp 98.9°F | Ht 63.5 in | Wt 166.0 lb

## 2018-02-15 DIAGNOSIS — Z01419 Encounter for gynecological examination (general) (routine) without abnormal findings: Secondary | ICD-10-CM | POA: Diagnosis not present

## 2018-02-15 DIAGNOSIS — Z23 Encounter for immunization: Secondary | ICD-10-CM

## 2018-02-15 DIAGNOSIS — Z0000 Encounter for general adult medical examination without abnormal findings: Secondary | ICD-10-CM | POA: Diagnosis not present

## 2018-02-15 DIAGNOSIS — R7303 Prediabetes: Secondary | ICD-10-CM | POA: Diagnosis not present

## 2018-02-15 DIAGNOSIS — E785 Hyperlipidemia, unspecified: Secondary | ICD-10-CM | POA: Diagnosis not present

## 2018-02-15 NOTE — Assessment & Plan Note (Signed)
Exam and pap done  No c/o Was prev on bio ID hormones-has stopped

## 2018-02-15 NOTE — Assessment & Plan Note (Signed)
Reviewed health habits including diet and exercise and skin cancer prevention Reviewed appropriate screening tests for age  Also reviewed health mt list, fam hx and immunization status , as well as social and family history   See HPI Labs rev  Flu shot given  Pap and gyn exam done  Enc more time for self care

## 2018-02-15 NOTE — Assessment & Plan Note (Signed)
Both HDL and LDL are down Disc goals for lipids and reasons to control them Rev last labs with pt Rev low sat fat diet in detail Enc inc exercise

## 2018-02-15 NOTE — Progress Notes (Signed)
Subjective:    Patient ID: Tracy Blankenship, female    DOB: [birth year], 62 y.o.   MRN: [id number]  HPI  Here for health maintenance exam and to review chronic medical problems    Doing ok  Not taking care of herself - just too busy  Taking care of grand kids - also re finishes furniture  Husband just retired - adjustment   Wt Readings from Last 3 Encounters:  02/15/18 166 lb (75.3 kg)  11/06/17 157 lb (71.2 kg)  09/09/17 163 lb (73.9 kg)  up 7 lb  28.94 kg/m   Eats out too much !  She did cut out biscuits (eats occ egg mc muffin)  Cut out most bread (occ wheat toast)  Less french fries  Used to drink sweet tea - now changed to 1/2 and 1/2  Some coffee    Flu shot- given today   Pap 9/16 Wants to do today   Mammogram 7/19 Self breast exam - no lumps   Tetanus shot 12/13  Colonoscopy 4/19-adenoma found   Zoster status -has not been vaccinated   BP  BP Readings from Last 3 Encounters:  02/15/18 136/80  11/06/17 (!) 151/66  09/09/17 (!) 117/57     Hyperlipidemia  Lab Results  Component Value Date   CHOL 178 02/11/2018   CHOL 212 (H) 02/09/2017   CHOL 207 (H) 02/01/2016   Lab Results  Component Value Date   HDL 51.90 02/11/2018   HDL 72.60 02/09/2017   HDL 54.70 02/01/2016   Lab Results  Component Value Date   LDLCALC 108 (H) 02/11/2018   LDLCALC 115 (H) 02/09/2017   LDLCALC 131 (H) 02/01/2016   Lab Results  Component Value Date   TRIG 91.0 02/11/2018   TRIG 124.0 02/09/2017   TRIG 109.0 02/01/2016   Lab Results  Component Value Date   CHOLHDL 3 02/11/2018   CHOLHDL 3 02/09/2017   CHOLHDL 4 02/01/2016   Lab Results  Component Value Date   LDLDIRECT 126.5 12/17/2009  HDL is down  Less exercise    Prediabetes Lab Results  Component Value Date   HGBA1C 5.6 02/11/2018   Other labs Lab Results  Component Value Date   CREATININE 0.80 02/11/2018   BUN 16 02/11/2018   NA 142 02/11/2018   K 4.0 02/11/2018   CL 105 02/11/2018   CO2 30 02/11/2018   Lab Results  Component Value Date   ALT 13 02/11/2018   AST 17 02/11/2018   ALKPHOS 80 02/11/2018   BILITOT 0.5 02/11/2018    Lab Results  Component Value Date   WBC 4.7 02/11/2018   HGB 14.2 02/11/2018   HCT 41.7 02/11/2018   MCV 85.6 02/11/2018   PLT 281.0 02/11/2018    Lab Results  Component Value Date   TSH 1.12 02/11/2018    Patient Active Problem List   Diagnosis Date Noted  . Eczema 02/09/2017  . Mild hyperlipidemia 02/08/2016  . H/O herpes zoster 01/31/2014  . Allergic reaction 11/08/2013  . Vaginal dryness 10/29/2012  . Encounter for routine gynecological examination 04/22/2011  . Routine general medical examination at a health care facility 03/30/2011  . Urinary incontinence 01/07/2011  . ALLERGIC RHINITIS 12/21/2009  . INTERSTITIAL CYSTITIS 12/08/2006  . Prediabetes 12/08/2006   Past Medical History:  Diagnosis Date  . Allergy   . Chronic sinusitis   . Hyperglycemia   . IC (interstitial cystitis)   . Plantar fasciitis   . Stress reaction   .  Trigger finger    Past Surgical History:  Procedure Laterality Date  . APPENDECTOMY    . CESAREAN SECTION     X 2  . WISDOM TOOTH EXTRACTION     Social History   Tobacco Use  . Smoking status: Never Smoker  . Smokeless tobacco: Never Used  Substance Use Topics  . Alcohol use: Yes    Alcohol/week: 0.0 standard drinks    Comment: wine -every 3-4 months  . Drug use: No   Family History  Problem Relation Age of Onset  . Cardiomyopathy Father   . Diabetes Father   . Stroke Father   . Anxiety disorder Mother   . Lupus Mother   . Emphysema Mother   . Coronary artery disease Mother   . Lung cancer Mother   . Hypertension Mother   . Heart disease Mother   . COPD Mother   . Kidney disease Mother   . Colon cancer Neg Hx   . Breast cancer Neg Hx   . Colon polyps Neg Hx   . Esophageal cancer Neg Hx   . Rectal cancer Neg Hx   . Stomach cancer Neg Hx    Allergies  Allergen  Reactions  . Claritin [Loratadine] Other (See Comments)    Felt jittery  . Penicillins     REACTION: ? allergy  . Pseudoephedrine     REACTION: makes her hyper   Current Outpatient Medications on File Prior to Visit  Medication Sig Dispense Refill  . Cetirizine HCl 10 MG CAPS Take 1 capsule by mouth daily as needed.     . Triamcinolone Acetonide (NASACORT ALLERGY 24HR NA) Place 2 sprays into the nose daily.    Marland Kitchen triamcinolone cream (KENALOG) 0.1 % Apply 1 application topically 2 (two) times daily. 30 g 0   No current facility-administered medications on file prior to visit.      Review of Systems  Constitutional: Positive for fatigue. Negative for activity change, appetite change, fever and unexpected weight change.  HENT: Negative for congestion, ear pain, rhinorrhea, sinus pressure and sore throat.   Eyes: Negative for pain, redness and visual disturbance.  Respiratory: Negative for cough, shortness of breath and wheezing.   Cardiovascular: Negative for chest pain and palpitations.  Gastrointestinal: Negative for abdominal pain, blood in stool, constipation and diarrhea.  Endocrine: Negative for polydipsia and polyuria.  Genitourinary: Negative for dysuria, frequency and urgency.  Musculoskeletal: Negative for arthralgias, back pain and myalgias.       Foot pain from plantar fasciitis   Skin: Negative for pallor and rash.  Allergic/Immunologic: Negative for environmental allergies.  Neurological: Negative for dizziness, syncope and headaches.  Hematological: Negative for adenopathy. Does not bruise/bleed easily.  Psychiatric/Behavioral: Negative for decreased concentration and dysphoric mood. The patient is not nervous/anxious.        Objective:   Physical Exam  Constitutional: She appears well-developed and well-nourished. No distress.  overwt and well app  HENT:  Head: Normocephalic and atraumatic.  Right Ear: External ear normal.  Left Ear: External ear normal.    Mouth/Throat: Oropharynx is clear and moist.  Eyes: Pupils are equal, round, and reactive to light. Conjunctivae and EOM are normal. No scleral icterus.  Neck: Normal range of motion. Neck supple. No JVD present. Carotid bruit is not present. No thyromegaly present.  Cardiovascular: Normal rate, regular rhythm, normal heart sounds and intact distal pulses. Exam reveals no gallop.  Pulmonary/Chest: Effort normal and breath sounds normal. No respiratory distress. She has no  wheezes. She exhibits no tenderness. No breast tenderness, discharge or bleeding.  Abdominal: Soft. Bowel sounds are normal. She exhibits no distension, no abdominal bruit and no mass. There is no tenderness.  Genitourinary: No breast tenderness, discharge or bleeding.  Genitourinary Comments: Breast exam: No mass, nodules, thickening, tenderness, bulging, retraction, inflamation, nipple discharge or skin changes noted.  No axillary or clavicular LA.             Anus appears normal w/o hemorrhoids or masses     External genitalia : nl appearance and hair distribution/no lesions     Urethral meatus : nl size, no lesions or prolapse     Urethra: no masses, tenderness or scarring    Bladder : no masses or tenderness     Vagina: nl general appearance, no discharge or  Lesions, no significant cystocele  or rectocele     Cervix: no lesions/ discharge or friability    Uterus: nl size, contour, position, and mobility (not fixed) , non tender    Adnexa : no masses, tenderness, enlargement or nodularity        Musculoskeletal: Normal range of motion. She exhibits no edema or tenderness.  No kyphosis   Lymphadenopathy:    She has no cervical adenopathy.  Neurological: She is alert. She has normal reflexes. She displays normal reflexes. No cranial nerve deficit. She exhibits normal muscle tone. Coordination normal.  Skin: Skin is warm and dry. No rash noted. No erythema. No pallor.  Solar lentigines  diffusely   Psychiatric: She has a normal mood and affect.  Pleasant           Assessment & Plan:   Problem List Items Addressed This Visit      Other   Encounter for routine gynecological examination    Exam and pap done  No c/o Was prev on bio ID hormones-has stopped       Relevant Orders   Cytology - PAP   Mild hyperlipidemia    Both HDL and LDL are down Disc goals for lipids and reasons to control them Rev last labs with pt Rev low sat fat diet in detail Enc inc exercise       Prediabetes    Lab Results  Component Value Date   HGBA1C 5.6 02/11/2018   disc imp of low glycemic diet and wt loss to prevent DM2       Routine general medical examination at a health care facility - Primary    Reviewed health habits including diet and exercise and skin cancer prevention Reviewed appropriate screening tests for age  Also reviewed health mt list, fam hx and immunization status , as well as social and family history   See HPI Labs rev  Flu shot given  Pap and gyn exam done  Enc more time for self care        Other Visit Diagnoses    Need for influenza vaccination       Relevant Orders   Flu Vaccine QUAD 6+ mos PF IM (Fluarix Quad PF) (Completed)

## 2018-02-15 NOTE — Patient Instructions (Addendum)
Add 30 or more minutes per day of exercise just to exercise   Try to get most of your carbohydrates from produce (with the exception of white potatoes)  Eat less bread/pasta/rice/snack foods/cereals/sweets and other items from the middle of the grocery store (processed carbs)  Weight watchers is a good option   Really make an effort to drink more water and less other beverages   If you are interested in the new shingles vaccine (Shingrix) - call your local pharmacy to check on coverage and availability  Get on a wait list at pharmacy if affordable   Gyn exam today  Flu shot today  Take care of yourself

## 2018-02-15 NOTE — Assessment & Plan Note (Signed)
Lab Results  Component Value Date   HGBA1C 5.6 02/11/2018   disc imp of low glycemic diet and wt loss to prevent DM2

## 2018-02-17 LAB — CYTOLOGY - PAP
DIAGNOSIS: NEGATIVE
HPV: NOT DETECTED

## 2018-06-17 ENCOUNTER — Ambulatory Visit (INDEPENDENT_AMBULATORY_CARE_PROVIDER_SITE_OTHER)
Admission: RE | Admit: 2018-06-17 | Discharge: 2018-06-17 | Disposition: A | Discharge status: Home / Self Care | Payer: BC Managed Care – PPO | Source: Ambulatory Visit | Attending: Family Medicine | Admitting: Family Medicine

## 2018-06-17 ENCOUNTER — Encounter: Payer: Self-pay | Admitting: Family Medicine

## 2018-06-17 ENCOUNTER — Ambulatory Visit: Payer: BC Managed Care – PPO | Admitting: Family Medicine

## 2018-06-17 VITALS — BP 126/74 | HR 89 | Temp 98.6°F | Ht 63.5 in | Wt 168.5 lb

## 2018-06-17 DIAGNOSIS — M5441 Lumbago with sciatica, right side: Secondary | ICD-10-CM

## 2018-06-17 DIAGNOSIS — M545 Low back pain, unspecified: Secondary | ICD-10-CM | POA: Insufficient documentation

## 2018-06-17 DIAGNOSIS — M25551 Pain in right hip: Secondary | ICD-10-CM | POA: Diagnosis not present

## 2018-06-17 MED ORDER — CYCLOBENZAPRINE HCL 10 MG PO TABS: 5.0000 mg | ORAL_TABLET | Freq: Three times a day (TID) | ORAL | 0 refills | Status: DC | PRN

## 2018-06-17 MED ORDER — MELOXICAM 15 MG PO TABS: 15.0000 mg | ORAL_TABLET | Freq: Every day | ORAL | 1 refills | Status: DC

## 2018-06-17 NOTE — Assessment & Plan Note (Signed)
R low back and hip girdle with some radiation to lateral thigh No neuro symptoms  Reassuring exam (good rom hips) Xray LS today  meloxicam  Flexeril with caution of sedation  Heat /ice prn  May need a different sleep pos Walking  Back stretches (handout)  Avoid heavy lifting for now   Plan to follow with xray reading  May consider PT

## 2018-06-17 NOTE — Assessment & Plan Note (Signed)
Hip girdle and low back  Nl rom hip  See a//p for back pain

## 2018-06-17 NOTE — Patient Instructions (Signed)
Continue heat/ice if it helps  Take a look at the stretches for back/buttock   Xray today of lumbar area   Take meloxicam 15 mg daily with food as needed for pain  Flexeril up to three times daily as needed with caution of sedation   Plan to follow

## 2018-06-17 NOTE — Progress Notes (Signed)
Subjective:    Patient ID: Tracy Blankenship, female    DOB: 1955/07/29, 63 y.o.   MRN: [id number]  HPI Here for 2 d of R lateral leg/hip pain   Happened when bending over - felt a sudden pain (sharp)  Dull and throbbing now (sharp when she moves)  Stiff after inactivity  Also has been moving furniture lately   No bruising or swelling of area  No n/t  No weakness in her extremity   Some low back pain as -it communicates from    Lying down (any position) is terrible  Cannot roll over   Used  Heat Ice Aleve  Bio freeze   Patient Active Problem List   Diagnosis Date Noted  . Low back pain 06/17/2018  . Right hip pain 06/17/2018  . Eczema 02/09/2017  . Mild hyperlipidemia 02/08/2016  . H/O herpes zoster 01/31/2014  . Allergic reaction 11/08/2013  . Vaginal dryness 10/29/2012  . Encounter for routine gynecological examination 04/22/2011  . Routine general medical examination at a health care facility 03/30/2011  . Urinary incontinence 01/07/2011  . ALLERGIC RHINITIS 12/21/2009  . INTERSTITIAL CYSTITIS 12/08/2006  . Prediabetes 12/08/2006   Past Medical History:  Diagnosis Date  . Allergy   . Chronic sinusitis   . Hyperglycemia   . IC (interstitial cystitis)   . Plantar fasciitis   . Stress reaction   . Trigger finger    Past Surgical History:  Procedure Laterality Date  . APPENDECTOMY    . CESAREAN SECTION     X 2  . WISDOM TOOTH EXTRACTION     Social History   Tobacco Use  . Smoking status: Never Smoker  . Smokeless tobacco: Never Used  Substance Use Topics  . Alcohol use: Yes    Alcohol/week: 0.0 standard drinks    Comment: wine -every 3-4 months  . Drug use: No   Family History  Problem Relation Age of Onset  . Cardiomyopathy Father   . Diabetes Father   . Stroke Father   . Anxiety disorder Mother   . Lupus Mother   . Emphysema Mother   . Coronary artery disease Mother   . Lung cancer Mother   . Hypertension Mother   . Heart disease  Mother   . COPD Mother   . Kidney disease Mother   . Colon cancer Neg Hx   . Breast cancer Neg Hx   . Colon polyps Neg Hx   . Esophageal cancer Neg Hx   . Rectal cancer Neg Hx   . Stomach cancer Neg Hx    Allergies  Allergen Reactions  . Claritin [Loratadine] Other (See Comments)    Felt jittery  . Penicillins     REACTION: ? allergy  . Pseudoephedrine     REACTION: makes her hyper   Current Outpatient Medications on File Prior to Visit  Medication Sig Dispense Refill  . Cetirizine HCl 10 MG CAPS Take 1 capsule by mouth daily as needed.     . Triamcinolone Acetonide (NASACORT ALLERGY 24HR NA) Place 2 sprays into the nose daily.    Marland Kitchen triamcinolone cream (KENALOG) 0.1 % Apply 1 application topically 2 (two) times daily. 30 g 0  . Wheat Dextrin (BENEFIBER PO) Take by mouth daily.     No current facility-administered medications on file prior to visit.     Review of Systems  Constitutional: Negative for activity change, appetite change, fatigue, fever and unexpected weight change.  HENT: Positive for hearing loss.  Negative for congestion, ear pain, rhinorrhea, sinus pressure and sore throat.        Hearing concern-pt's husband says she can't hear She disagrees Wants to see if she has wax  Eyes: Negative for pain, redness and visual disturbance.  Respiratory: Negative for cough, shortness of breath and wheezing.   Cardiovascular: Negative for chest pain and palpitations.  Gastrointestinal: Negative for abdominal pain, blood in stool, constipation and diarrhea.  Endocrine: Negative for polydipsia and polyuria.  Genitourinary: Negative for dysuria, frequency and urgency.  Musculoskeletal: Positive for arthralgias and back pain. Negative for gait problem, joint swelling, myalgias, neck pain and neck stiffness.       Low back and hip girdle pain   Skin: Negative for pallor and rash.  Allergic/Immunologic: Negative for environmental allergies.  Neurological: Negative for dizziness,  syncope and headaches.  Hematological: Negative for adenopathy. Does not bruise/bleed easily.  Psychiatric/Behavioral: Negative for decreased concentration and dysphoric mood. The patient is not nervous/anxious.        Objective:   Physical Exam Constitutional:      General: She is not in acute distress.    Appearance: She is well-developed.  HENT:     Head: Normocephalic and atraumatic.     Right Ear: Tympanic membrane and ear canal normal.     Left Ear: Tympanic membrane and ear canal normal.  Eyes:     General: No scleral icterus.    Conjunctiva/sclera: Conjunctivae normal.     Pupils: Pupils are equal, round, and reactive to light.  Neck:     Musculoskeletal: Normal range of motion and neck supple.  Cardiovascular:     Rate and Rhythm: Normal rate and regular rhythm.  Pulmonary:     Effort: Pulmonary effort is normal.     Breath sounds: Normal breath sounds. No wheezing or rales.  Abdominal:     General: Bowel sounds are normal. There is no distension.     Palpations: Abdomen is soft.     Tenderness: There is no abdominal tenderness.  Musculoskeletal:        General: Tenderness present. No swelling, deformity or signs of injury.     Right shoulder: She exhibits decreased range of motion, tenderness and spasm. She exhibits no bony tenderness, no swelling, no crepitus, normal pulse and normal strength.     Lumbar back: She exhibits decreased range of motion, tenderness and spasm. She exhibits no bony tenderness and no edema.     Right lower leg: No edema.     Left lower leg: No edema.     Comments: No spinal tenderness  Tender in R buttock/hip girdle (not groin) Nl rom of hip  Very mild trochanteric tenderness   LS flex 45 deg  Nl ext and lateral bend  No foot drop or neuro changes   No rash   Lymphadenopathy:     Cervical: No cervical adenopathy.  Skin:    General: Skin is warm and dry.     Coloration: Skin is not pale.     Findings: No erythema, lesion or rash.    Neurological:     Mental Status: She is alert.     Cranial Nerves: No cranial nerve deficit.     Sensory: No sensory deficit.     Motor: No atrophy or abnormal muscle tone.     Coordination: Coordination normal.     Deep Tendon Reflexes: Reflexes are normal and symmetric.     Comments: Negative SLR  Assessment & Plan:   Problem List Items Addressed This Visit      Other   Low back pain - Primary    R low back and hip girdle with some radiation to lateral thigh No neuro symptoms  Reassuring exam (good rom hips) Xray LS today  meloxicam  Flexeril with caution of sedation  Heat /ice prn  May need a different sleep pos Walking  Back stretches (handout)  Avoid heavy lifting for now   Plan to follow with xray reading  May consider PT        Relevant Medications   meloxicam (MOBIC) 15 MG tablet   cyclobenzaprine (FLEXERIL) 10 MG tablet   Other Relevant Orders   DG Lumbar Spine Complete   Right hip pain    Hip girdle and low back  Nl rom hip  See a//p for back pain       Relevant Orders   DG Lumbar Spine Complete

## 2018-11-23 ENCOUNTER — Other Ambulatory Visit: Payer: Self-pay | Admitting: Family Medicine

## 2018-11-23 DIAGNOSIS — Z1231 Encounter for screening mammogram for malignant neoplasm of breast: Secondary | ICD-10-CM

## 2019-01-10 ENCOUNTER — Other Ambulatory Visit: Payer: Self-pay

## 2019-01-10 ENCOUNTER — Ambulatory Visit
Admission: RE | Admit: 2019-01-10 | Discharge: 2019-01-10 | Disposition: A | Discharge status: Home / Self Care | Payer: BC Managed Care – PPO | Source: Ambulatory Visit | Attending: Family Medicine | Admitting: Family Medicine

## 2019-01-10 DIAGNOSIS — Z1231 Encounter for screening mammogram for malignant neoplasm of breast: Secondary | ICD-10-CM

## 2019-02-14 ENCOUNTER — Telehealth: Payer: Self-pay | Admitting: Family Medicine

## 2019-02-14 DIAGNOSIS — R7303 Prediabetes: Secondary | ICD-10-CM

## 2019-02-14 DIAGNOSIS — E785 Hyperlipidemia, unspecified: Secondary | ICD-10-CM

## 2019-02-14 DIAGNOSIS — Z0000 Encounter for general adult medical examination without abnormal findings: Secondary | ICD-10-CM

## 2019-02-14 NOTE — Telephone Encounter (Signed)
-----   Message from Ellamae Sia sent at 02/08/2019 11:14 AM EDT ----- Regarding: Lab orders for Tuesday, 9.22.20 Patient is scheduled for CPX labs, please order future labs, Thanks , Karna Christmas

## 2019-02-15 ENCOUNTER — Other Ambulatory Visit (INDEPENDENT_AMBULATORY_CARE_PROVIDER_SITE_OTHER): Payer: BC Managed Care – PPO

## 2019-02-15 DIAGNOSIS — R7303 Prediabetes: Secondary | ICD-10-CM

## 2019-02-15 DIAGNOSIS — E785 Hyperlipidemia, unspecified: Secondary | ICD-10-CM

## 2019-02-15 DIAGNOSIS — Z0000 Encounter for general adult medical examination without abnormal findings: Secondary | ICD-10-CM

## 2019-02-15 LAB — CBC WITH DIFFERENTIAL/PLATELET
Basophils Absolute: 0 10*3/uL (ref 0.0–0.1)
Basophils Relative: 0.3 % (ref 0.0–3.0)
Eosinophils Absolute: 0.1 10*3/uL (ref 0.0–0.7)
Eosinophils Relative: 2.6 % (ref 0.0–5.0)
HCT: 44.2 % (ref 36.0–46.0)
Hemoglobin: 14.9 g/dL (ref 12.0–15.0)
Lymphocytes Relative: 33.8 % (ref 12.0–46.0)
Lymphs Abs: 1.6 10*3/uL (ref 0.7–4.0)
MCHC: 33.6 g/dL (ref 30.0–36.0)
MCV: 86.7 fl (ref 78.0–100.0)
Monocytes Absolute: 0.3 10*3/uL (ref 0.1–1.0)
Monocytes Relative: 7 % (ref 3.0–12.0)
Neutro Abs: 2.7 10*3/uL (ref 1.4–7.7)
Neutrophils Relative %: 56.3 % (ref 43.0–77.0)
Platelets: 289 10*3/uL (ref 150.0–400.0)
RBC: 5.1 Mil/uL (ref 3.87–5.11)
RDW: 13.5 % (ref 11.5–15.5)
WBC: 4.9 10*3/uL (ref 4.0–10.5)

## 2019-02-15 LAB — COMPREHENSIVE METABOLIC PANEL
ALT: 13 U/L (ref 0–35)
AST: 18 U/L (ref 0–37)
Albumin: 4.5 g/dL (ref 3.5–5.2)
Alkaline Phosphatase: 81 U/L (ref 39–117)
BUN: 18 mg/dL (ref 6–23)
CO2: 32 mEq/L (ref 19–32)
Calcium: 9.9 mg/dL (ref 8.4–10.5)
Chloride: 102 mEq/L (ref 96–112)
Creatinine, Ser: 0.8 mg/dL (ref 0.40–1.20)
GFR: 72.25 mL/min (ref >60.00)
Glucose, Bld: 96 mg/dL (ref 70–99)
Potassium: 4.2 mEq/L (ref 3.5–5.1)
Sodium: 140 mEq/L (ref 135–145)
Total Bilirubin: 0.5 mg/dL (ref 0.2–1.2)
Total Protein: 7.1 g/dL (ref 6.0–8.3)

## 2019-02-15 LAB — LIPID PANEL
Cholesterol: 212 mg/dL — ABNORMAL HIGH (ref 0–200)
HDL: 50.8 mg/dL (ref >39.00)
LDL Cholesterol: 131 mg/dL — ABNORMAL HIGH (ref 0–99)
NonHDL: 161.53
Total CHOL/HDL Ratio: 4
Triglycerides: 151 mg/dL — ABNORMAL HIGH (ref 0.0–149.0)
VLDL: 30.2 mg/dL (ref 0.0–40.0)

## 2019-02-15 LAB — HEMOGLOBIN A1C: Hgb A1c MFr Bld: 5.7 % (ref 4.6–6.5)

## 2019-02-15 LAB — TSH: TSH: 1.05 u[IU]/mL (ref 0.35–4.50)

## 2019-02-17 ENCOUNTER — Other Ambulatory Visit: Payer: Self-pay

## 2019-02-17 ENCOUNTER — Ambulatory Visit (INDEPENDENT_AMBULATORY_CARE_PROVIDER_SITE_OTHER): Payer: BC Managed Care – PPO | Admitting: Family Medicine

## 2019-02-17 ENCOUNTER — Encounter: Payer: Self-pay | Admitting: Family Medicine

## 2019-02-17 VITALS — BP 138/70 | HR 105 | Temp 97.6°F | Ht 63.25 in | Wt 165.5 lb

## 2019-02-17 DIAGNOSIS — E785 Hyperlipidemia, unspecified: Secondary | ICD-10-CM

## 2019-02-17 DIAGNOSIS — J301 Allergic rhinitis due to pollen: Secondary | ICD-10-CM

## 2019-02-17 DIAGNOSIS — R7303 Prediabetes: Secondary | ICD-10-CM | POA: Diagnosis not present

## 2019-02-17 DIAGNOSIS — Z0000 Encounter for general adult medical examination without abnormal findings: Secondary | ICD-10-CM | POA: Diagnosis not present

## 2019-02-17 NOTE — Assessment & Plan Note (Signed)
Reviewed health habits including diet and exercise and skin cancer prevention Reviewed appropriate screening tests for age  Also reviewed health mt list, fam hx and immunization status , as well as social and family history   See HPI Labs reviewed  Pt chooses to get flu shot in a few weeks instead of today  Plan made to check a lipid profile in 3 mo after better diet  Pt plans to check on coverage of a shingrix vaccine  Enc inc water intake for bowel habits

## 2019-02-17 NOTE — Assessment & Plan Note (Signed)
May have some ETD today - with mild positional dizziness  Recommend she use her nasacort regularly and start back on zyrtec if needed  Update if not starting to improve in a week or if worsening

## 2019-02-17 NOTE — Assessment & Plan Note (Signed)
LDL is up to 131 Eating eggs and cheese and some bacon Disc goals for lipids and reasons to control them Rev last labs with pt Rev low sat fat diet in detail Will re check in 3 mo  If no improvement consider medication /statin

## 2019-02-17 NOTE — Progress Notes (Signed)
Subjective:    Patient ID: Tracy Blankenship, female    DOB: [birth year], 63 y.o.   MRN: [id number]  HPI Here for health maintenance exam and to review chronic medical problems  Concerned about her labs   Feels a little dizzy this am (when she rolled over in bed) -just with large head movements  Not a lot of allergies    occ tingling in her R arm/ perhaps it feels cold  No pain  ? 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)  29.09 kg/m   Flu vaccine -not feeling well enough   Mammogram 8/20  Self breast exam - no lumps   Pap 9/19 neg with neg HPV screen  No symptoms   Tdap 12/13  Zoster status -considering shingrix   Colonoscopy 4/19 with 5 y recall   Somewhat upset/anxious today -her husband will not take covid precautions as much as he should and he is putting family at risk  She cannot reason with him    BP Readings from Last 3 Encounters:  02/17/19 (!) 158/74  06/17/18 126/74  02/15/18 136/80  was high at the dentist when she had a crown done -was nervous  Better on 2nd check today BP: 138/70    Pulse Readings from Last 3 Encounters:  02/17/19 (!) 105  06/17/18 89  02/15/18 78   Hyperlipidemia Lab Results  Component Value Date   CHOL 212 (H) 02/15/2019   CHOL 178 02/11/2018   CHOL 212 (H) 02/09/2017   Lab Results  Component Value Date   HDL 50.80 02/15/2019   HDL 51.90 02/11/2018   HDL 72.60  02/09/2017   Lab Results  Component Value Date   LDLCALC 131 (H) 02/15/2019   LDLCALC 108 (H) 02/11/2018   LDLCALC 115 (H) 02/09/2017   Lab Results  Component Value Date   TRIG 151.0 (H) 02/15/2019   TRIG 91.0 02/11/2018   TRIG 124.0 02/09/2017   Lab Results  Component Value Date   CHOLHDL 4 02/15/2019   CHOLHDL 3 02/11/2018   CHOLHDL 3 02/09/2017   Lab Results  Component Value Date   LDLDIRECT 126.5 12/17/2009  more eggs Some regular bacon  Frozen waffles  Wheat bread   Other labs Results for orders placed or performed in visit on 02/15/19  TSH  Result Value Ref Range   TSH 1.05 0.35 - 4.50 uIU/mL  Lipid panel  Result Value Ref Range   Cholesterol 212 (H) 0 - 200 mg/dL   Triglycerides 151.0 (H) 0.0 - 149.0 mg/dL   HDL 50.80 >39.00 mg/dL   VLDL 30.2 0.0 - 40.0 mg/dL   LDL Cholesterol 131 (H) 0 - 99 mg/dL   Total CHOL/HDL Ratio 4    NonHDL 161.53   Hemoglobin A1c  Result Value Ref Range   Hgb A1c MFr Bld  5.7 4.6 - 6.5 %  CBC with Differential/Platelet  Result Value Ref Range   WBC 4.9 4.0 - 10.5 K/uL   RBC 5.10 3.87 - 5.11 Mil/uL   Hemoglobin 14.9 12.0 - 15.0 g/dL   HCT 44.2 36.0 - 46.0 %   MCV 86.7 78.0 - 100.0 fl   MCHC 33.6 30.0 - 36.0 g/dL   RDW 13.5 11.5 - 15.5 %   Platelets 289.0 150.0 - 400.0 K/uL   Neutrophils Relative % 56.3 43.0 - 77.0 %   Lymphocytes Relative 33.8 12.0 - 46.0 %   Monocytes Relative 7.0 3.0 - 12.0 %   Eosinophils Relative 2.6 0.0 - 5.0 %   Basophils Relative 0.3 0.0 - 3.0 %   Neutro Abs 2.7 1.4 - 7.7 K/uL   Lymphs Abs 1.6 0.7 - 4.0 K/uL   Monocytes Absolute 0.3 0.1 - 1.0 K/uL   Eosinophils Absolute 0.1 0.0 - 0.7 K/uL   Basophils Absolute 0.0 0.0 - 0.1 K/uL  Comprehensive metabolic panel  Result Value Ref Range   Sodium 140 135 - 145 mEq/L   Potassium 4.2 3.5 - 5.1 mEq/L   Chloride 102 96 - 112 mEq/L   CO2 32 19 - 32 mEq/L   Glucose, Bld 96 70 - 99 mg/dL   BUN 18 6 - 23 mg/dL   Creatinine, Ser 0.80 0.40 - 1.20 mg/dL    Total Bilirubin 0.5 0.2 - 1.2 mg/dL   Alkaline Phosphatase 81 39 - 117 U/L   AST 18 0 - 37 U/L   ALT 13 0 - 35 U/L   Total Protein 7.1 6.0 - 8.3 g/dL   Albumin 4.5 3.5 - 5.2 g/dL   Calcium 9.9 8.4 - 10.5 mg/dL   GFR 72.25 >60.00 mL/min     Prediabetes  Lab Results  Component Value Date   HGBA1C 5.7 02/15/2019   Patient Active Problem List   Diagnosis Date Noted  . Low back pain 06/17/2018  . Right hip pain 06/17/2018  . Eczema 02/09/2017  . Mild hyperlipidemia 02/08/2016  . H/O herpes zoster 01/31/2014  . Allergic reaction 11/08/2013  . Vaginal dryness 10/29/2012  . Encounter for routine gynecological examination 04/22/2011  . Routine general medical examination at a health care facility 03/30/2011  . Urinary incontinence 01/07/2011  . Allergic rhinitis 12/21/2009  . INTERSTITIAL CYSTITIS 12/08/2006  . Prediabetes 12/08/2006   Past Medical History:  Diagnosis Date  . Allergy   . Chronic sinusitis   . Hyperglycemia   . IC (interstitial cystitis)   . Plantar fasciitis   . Stress reaction   . Trigger finger    Past Surgical History:  Procedure Laterality Date  . APPENDECTOMY    . CESAREAN SECTION     X 2  . WISDOM TOOTH EXTRACTION     Social History   Tobacco Use  . Smoking status: Never Smoker  . Smokeless tobacco: Never Used  Substance Use Topics  . Alcohol use: Yes    Alcohol/week: 0.0 standard drinks    Comment: wine -every 3-4 months  . Drug use: No   Family History  Problem Relation Age of Onset  . Cardiomyopathy Father   . Diabetes Father   . Stroke Father   . Anxiety disorder Mother   . Lupus Mother   . Emphysema Mother   . Coronary artery disease Mother   . Lung cancer Mother   . Hypertension Mother   . Heart disease Mother   . COPD Mother   .  Kidney disease Mother   . Colon cancer Neg Hx   . Breast cancer Neg Hx   . Colon polyps Neg Hx   . Esophageal cancer Neg Hx   . Rectal cancer Neg Hx   . Stomach cancer Neg Hx    Allergies   Allergen Reactions  . Claritin [Loratadine] Other (See Comments)    Felt jittery  . Penicillins     REACTION: ? allergy  . Pseudoephedrine     REACTION: makes her hyper   Current Outpatient Medications on File Prior to Visit  Medication Sig Dispense Refill  . Cetirizine HCl 10 MG CAPS Take 1 capsule by mouth daily as needed.     . Triamcinolone Acetonide (NASACORT ALLERGY 24HR NA) Place 2 sprays into the nose daily.    Marland Kitchen triamcinolone cream (KENALOG) 0.1 % Apply 1 application topically 2 (two) times daily. 30 g 0  . Wheat Dextrin (BENEFIBER PO) Take by mouth daily.     No current facility-administered medications on file prior to visit.     Review of Systems  Constitutional: Negative for activity change, appetite change, fatigue, fever and unexpected weight change.  HENT: Negative for congestion, ear pain, rhinorrhea, sinus pressure and sore throat.   Eyes: Negative for pain, redness and visual disturbance.  Respiratory: Negative for cough, shortness of breath and wheezing.   Cardiovascular: Negative for chest pain, palpitations and leg swelling.  Gastrointestinal: Positive for constipation. Negative for abdominal pain, blood in stool and diarrhea.  Endocrine: Negative for polydipsia and polyuria.  Genitourinary: Negative for dysuria, frequency and urgency.  Musculoskeletal: Negative for arthralgias, back pain and myalgias.  Skin: Negative for pallor and rash.  Allergic/Immunologic: Negative for environmental allergies.  Neurological: Positive for dizziness. Negative for syncope, weakness and headaches.       R arm tingles at times (painting and lifting furniture)   Hematological: Negative for adenopathy. Does not bruise/bleed easily.  Psychiatric/Behavioral: Negative for decreased concentration and dysphoric mood. The patient is not nervous/anxious.        Very stressed  Her husband will not take precautions for covid and putting her and family at risk  This distresses her         Objective:   Physical Exam Constitutional:      General: She is not in acute distress.    Appearance: Normal appearance. She is well-developed. She is not ill-appearing or diaphoretic.  HENT:     Head: Normocephalic and atraumatic.     Right Ear: Ear canal and external ear normal.     Left Ear: Ear canal and external ear normal.     Ears:     Comments: TMs are mildly retracted    Nose: Nose normal. No congestion.     Comments: Boggy nares    Mouth/Throat:     Mouth: Mucous membranes are moist.     Pharynx: Oropharynx is clear. No posterior oropharyngeal erythema.  Eyes:     General: No scleral icterus.    Extraocular Movements: Extraocular movements intact.     Conjunctiva/sclera: Conjunctivae normal.     Pupils: Pupils are equal, round, and reactive to light.     Comments: Few beats or horizontal nystagmus bilat   Neck:     Musculoskeletal: Normal range of motion and neck supple. No neck rigidity or muscular tenderness.     Thyroid: No thyromegaly.     Vascular: No carotid bruit or JVD.  Cardiovascular:     Rate and Rhythm: Normal rate and  regular rhythm.     Pulses: Normal pulses.     Heart sounds: Normal heart sounds. No gallop.   Pulmonary:     Effort: Pulmonary effort is normal. No respiratory distress.     Breath sounds: Normal breath sounds. No wheezing.     Comments: Good air exch Chest:     Chest wall: No tenderness.  Abdominal:     General: Bowel sounds are normal. There is no distension or abdominal bruit.     Palpations: Abdomen is soft. There is no mass.     Tenderness: There is no abdominal tenderness.     Hernia: No hernia is present.  Genitourinary:    Comments: Breast exam: No mass, nodules, thickening, tenderness, bulging, retraction, inflamation, nipple discharge or skin changes noted.  No axillary or clavicular LA.     Musculoskeletal: Normal range of motion.        General: No tenderness.     Right lower leg: No edema.     Left lower leg: No  edema.     Comments: No R elbow/shoulder or wrist swelling or tendenress  Lymphadenopathy:     Cervical: No cervical adenopathy.  Skin:    General: Skin is warm and dry.     Coloration: Skin is not pale.     Findings: No erythema or rash.  Neurological:     Mental Status: She is alert. Mental status is at baseline.     Cranial Nerves: No cranial nerve deficit.     Motor: No abnormal muscle tone.     Coordination: Coordination normal.     Gait: Gait normal.     Deep Tendon Reflexes: Reflexes are normal and symmetric.  Psychiatric:        Mood and Affect: Mood is anxious.        Behavior: Behavior is not withdrawn.        Cognition and Memory: Cognition and memory normal.           Assessment & Plan:   Problem List Items Addressed This Visit      Respiratory   Allergic rhinitis    May have some ETD today - with mild positional dizziness  Recommend she use her nasacort regularly and start back on zyrtec if needed  Update if not starting to improve in a week or if worsening          Other   Prediabetes    Lab Results  Component Value Date   HGBA1C 5.7 02/15/2019   disc imp of low glycemic diet and wt loss to prevent DM2       Routine general medical examination at a health care facility - Primary    Reviewed health habits including diet and exercise and skin cancer prevention Reviewed appropriate screening tests for age  Also reviewed health mt list, fam hx and immunization status , as well as social and family history   See HPI Labs reviewed  Pt chooses to get flu shot in a few weeks instead of today  Plan made to check a lipid profile in 3 mo after better diet  Pt plans to check on coverage of a shingrix vaccine  Enc inc water intake for bowel habits         Mild hyperlipidemia    LDL is up to 131 Eating eggs and cheese and some bacon Disc goals for lipids and reasons to control them Rev last labs with pt Rev low sat fat diet in detail Will  re check in  3 mo  If no improvement consider medication /statin       Relevant Orders   Lipid panel

## 2019-02-17 NOTE — Patient Instructions (Addendum)
Aim for 64 oz of water per day   Get a flu shot when you feel better   Find out if your insurance covers shingrix  If you can afford it -call us to schedule   For cholesterol Avoid red meat/ fried foods/ egg yolks/ fatty breakfast meats/ butter, cheese and high fat dairy/ and shellfish    You may have some positional vertigo  Dramamine is ok  Get back on your zyrtec  Let me know if no improvement  Do not change position quickly   If right arm problem persists let me know  repetitive movement may make it worse    Schedule a fasting lab draw for 3 months for cholesterol

## 2019-02-17 NOTE — Assessment & Plan Note (Signed)
Lab Results  Component Value Date   HGBA1C 5.7 02/15/2019   disc imp of low glycemic diet and wt loss to prevent DM2

## 2019-02-28 ENCOUNTER — Telehealth: Payer: Self-pay | Admitting: Family Medicine

## 2019-02-28 DIAGNOSIS — R42 Dizziness and giddiness: Secondary | ICD-10-CM | POA: Insufficient documentation

## 2019-02-28 NOTE — Telephone Encounter (Signed)
Ref done Will sent to [facility]

## 2019-02-28 NOTE — Telephone Encounter (Signed)
Patient was seen in the office for her physical on 9/24. She stated her ear blockage was discuss and vertigo. Patient stated that it does not seem to be improving and she would like a referral to an ENT doctor in [place].

## 2019-03-24 ENCOUNTER — Ambulatory Visit (INDEPENDENT_AMBULATORY_CARE_PROVIDER_SITE_OTHER): Payer: BC Managed Care – PPO

## 2019-03-24 DIAGNOSIS — Z23 Encounter for immunization: Secondary | ICD-10-CM

## 2019-05-24 ENCOUNTER — Other Ambulatory Visit: Payer: BC Managed Care – PPO

## 2019-11-11 ENCOUNTER — Other Ambulatory Visit (INDEPENDENT_AMBULATORY_CARE_PROVIDER_SITE_OTHER): Payer: BC Managed Care – PPO

## 2019-11-11 ENCOUNTER — Other Ambulatory Visit: Payer: Self-pay

## 2019-11-11 DIAGNOSIS — E785 Hyperlipidemia, unspecified: Secondary | ICD-10-CM

## 2019-11-11 LAB — LIPID PANEL
Cholesterol: 194 mg/dL (ref 0–200)
HDL: 50.5 mg/dL (ref >39.00)
LDL Cholesterol: 111 mg/dL — ABNORMAL HIGH (ref 0–99)
NonHDL: 143.08
Total CHOL/HDL Ratio: 4
Triglycerides: 159 mg/dL — ABNORMAL HIGH (ref 0.0–149.0)
VLDL: 31.8 mg/dL (ref 0.0–40.0)

## 2019-12-14 ENCOUNTER — Other Ambulatory Visit: Payer: Self-pay | Admitting: Family Medicine

## 2019-12-14 DIAGNOSIS — Z1231 Encounter for screening mammogram for malignant neoplasm of breast: Secondary | ICD-10-CM

## 2020-01-11 ENCOUNTER — Other Ambulatory Visit: Payer: Self-pay

## 2020-01-11 ENCOUNTER — Ambulatory Visit
Admission: RE | Admit: 2020-01-11 | Discharge: 2020-01-11 | Disposition: A | Discharge status: Home / Self Care | Payer: BC Managed Care – PPO | Source: Ambulatory Visit | Attending: Family Medicine | Admitting: Family Medicine

## 2020-01-11 DIAGNOSIS — Z1231 Encounter for screening mammogram for malignant neoplasm of breast: Secondary | ICD-10-CM

## 2020-05-08 ENCOUNTER — Encounter: Payer: Self-pay | Admitting: Family Medicine

## 2020-05-08 ENCOUNTER — Other Ambulatory Visit: Payer: Self-pay

## 2020-05-08 ENCOUNTER — Ambulatory Visit (INDEPENDENT_AMBULATORY_CARE_PROVIDER_SITE_OTHER): Payer: Medicare Other | Admitting: Family Medicine

## 2020-05-08 ENCOUNTER — Ambulatory Visit (INDEPENDENT_AMBULATORY_CARE_PROVIDER_SITE_OTHER)
Admission: RE | Admit: 2020-05-08 | Discharge: 2020-05-08 | Disposition: A | Discharge status: Home / Self Care | Payer: Medicare Other | Source: Ambulatory Visit | Attending: Family Medicine | Admitting: Family Medicine

## 2020-05-08 VITALS — BP 142/86 | HR 90 | Temp 97.1°F | Ht 63.25 in | Wt 160.3 lb

## 2020-05-08 DIAGNOSIS — R1012 Left upper quadrant pain: Secondary | ICD-10-CM | POA: Diagnosis not present

## 2020-05-08 DIAGNOSIS — Z23 Encounter for immunization: Secondary | ICD-10-CM | POA: Diagnosis not present

## 2020-05-08 LAB — RENAL FUNCTION PANEL
Albumin: 4.6 g/dL (ref 3.5–5.2)
BUN: 15 mg/dL (ref 6–23)
CO2: 32 mEq/L (ref 19–32)
Calcium: 10.1 mg/dL (ref 8.4–10.5)
Chloride: 98 mEq/L (ref 96–112)
Creatinine, Ser: 0.82 mg/dL (ref 0.40–1.20)
GFR: 75.25 mL/min (ref >60.00)
Glucose, Bld: 66 mg/dL — ABNORMAL LOW (ref 70–99)
Phosphorus: 3.4 mg/dL (ref 2.3–4.6)
Potassium: 3.9 mEq/L (ref 3.5–5.1)
Sodium: 138 mEq/L (ref 135–145)

## 2020-05-08 LAB — CBC WITH DIFFERENTIAL/PLATELET
Basophils Absolute: 0 10*3/uL (ref 0.0–0.1)
Basophils Relative: 0.7 % (ref 0.0–3.0)
Eosinophils Absolute: 0.1 10*3/uL (ref 0.0–0.7)
Eosinophils Relative: 1.6 % (ref 0.0–5.0)
HCT: 45.3 % (ref 36.0–46.0)
Hemoglobin: 15.2 g/dL — ABNORMAL HIGH (ref 12.0–15.0)
Lymphocytes Relative: 36.5 % (ref 12.0–46.0)
Lymphs Abs: 2.1 10*3/uL (ref 0.7–4.0)
MCHC: 33.6 g/dL (ref 30.0–36.0)
MCV: 85.9 fl (ref 78.0–100.0)
Monocytes Absolute: 0.4 10*3/uL (ref 0.1–1.0)
Monocytes Relative: 7 % (ref 3.0–12.0)
Neutro Abs: 3.1 10*3/uL (ref 1.4–7.7)
Neutrophils Relative %: 54.2 % (ref 43.0–77.0)
Platelets: 307 10*3/uL (ref 150.0–400.0)
RBC: 5.27 Mil/uL — ABNORMAL HIGH (ref 3.87–5.11)
RDW: 13 % (ref 11.5–15.5)
WBC: 5.7 10*3/uL (ref 4.0–10.5)

## 2020-05-08 LAB — POC URINALSYSI DIPSTICK (AUTOMATED)
Bilirubin, UA: NEGATIVE
Blood, UA: NEGATIVE
Glucose, UA: NEGATIVE
Ketones, UA: NEGATIVE
Leukocytes, UA: NEGATIVE
Nitrite, UA: NEGATIVE
Protein, UA: NEGATIVE
Spec Grav, UA: 1.015 (ref 1.010–1.025)
Urobilinogen, UA: 0.2 E.U./dL
pH, UA: 6 (ref 5.0–8.0)

## 2020-05-08 LAB — LIPASE: Lipase: 40 U/L (ref 11.0–59.0)

## 2020-05-08 MED ORDER — FAMOTIDINE 20 MG PO TABS
20.0000 mg | ORAL_TABLET | Freq: Two times a day (BID) | ORAL | 3 refills | Status: DC
Start: 2020-05-08 — End: 2021-07-29

## 2020-05-08 NOTE — Progress Notes (Signed)
Subjective:    Patient ID: Tracy Blankenship, female    DOB: [birth year], 64 y.o.   MRN: [id number]  This visit occurred during the SARS-CoV-2 public health emergency.  Safety protocols were in place, including screening questions prior to the visit, additional usage of staff PPE, and extensive cleaning of exam room while observing appropriate contact time as indicated for disinfecting solutions.    HPI Pt presents for abdominal pain   Wt Readings from Last 3 Encounters:  05/08/20 160 lb 5 oz (72.7 kg)  02/17/19 165 lb 8 oz (75.1 kg)  06/17/18 168 lb 8 oz (76.4 kg)   28.17 kg/m  Having some upper abdominal pain  Worse on L under her ribs  Bra hurts it  Worse to lie on that side   Digestive problems - sometimes has frequent bms and other days does not go Tries not to strain  Taking benefiber  Eating better -fruits and vegetables  No blood in stool   No difference with eating  Avoids spicy food /vinegar- avoids acid   No n/v  No rash   She has tried pepcid -occasionally  May help the burning   No heartburn but sometimes burning in epigastric area   Nl colonoscopy 2019  Last abd [place]: US Abdomen Complete (Accession [id number]) (Order [id number]) Imaging Date: 05/05/2011 Department: [facility] Released By: Nathanial Rancher Authorizing: Tanmay Halteman, Wynelle Fanny, MD    Exam Status  Status  Final [99]   PACS Intelerad Image Link  Show images for US Abdomen Complete  Study Result  Narrative  *RADIOLOGY REPORT*   Clinical Data: Left upper quadrant pain.   COMPLETE ABDOMINAL ULTRASOUND   Comparison: None.   Findings:   Gallbladder: No gallstones, gallbladder wall thickening, or  pericholecystic fluid. Negative sonographic Murphy's sign.   Common bile duct: Normal. 3.1 mm in diameter.   Liver: No focal lesion identified. Within normal limits in  parenchymal echogenicity.   IVC: Appears normal.   Pancreas: Normal.   Spleen:  Normal. 7.1 cm in length.   Right Kidney: Normal. 10.9 cm in length.   Left Kidney: Normal. 11.3 cm in length.   Abdominal aorta: Normal. 2.0 cm maximal diameter.   IMPRESSION:  Negative abdominal ultrasound.   Original Report Authenticated By: Larey Seat, M.D.   Lab Results  Component Value Date   CREATININE 0.80 02/15/2019   BUN 18 02/15/2019   NA 140 02/15/2019   K 4.2 02/15/2019   CL 102 02/15/2019   CO2 32 02/15/2019   Lab Results  Component Value Date   WBC 4.9 02/15/2019   HGB 14.9 02/15/2019   HCT 44.2 02/15/2019   MCV 86.7 02/15/2019   PLT 289.0 02/15/2019    Patient Active Problem List   Diagnosis Date Noted  . Vertigo 02/28/2019  . Low back pain 06/17/2018  . Right hip pain 06/17/2018  . Eczema 02/09/2017  . Mild hyperlipidemia 02/08/2016  . H/O herpes zoster 01/31/2014  . Allergic reaction 11/08/2013  . Vaginal dryness 10/29/2012  . Abdominal pain, left upper quadrant 04/29/2011  . Encounter for routine gynecological examination 04/22/2011  . Routine general medical examination at a health care facility 03/30/2011  . Urinary incontinence 01/07/2011  . Allergic rhinitis 12/21/2009  . INTERSTITIAL CYSTITIS 12/08/2006  . Prediabetes 12/08/2006   Past Medical History:  Diagnosis Date  . Allergy   . Chronic sinusitis   . Hyperglycemia   . IC (interstitial cystitis)   . Plantar fasciitis   .  Stress reaction   . Trigger finger    Past Surgical History:  Procedure Laterality Date  . APPENDECTOMY    . CESAREAN SECTION     X 2  . WISDOM TOOTH EXTRACTION     Social History   Tobacco Use  . Smoking status: Never Smoker  . Smokeless tobacco: Never Used  Vaping Use  . Vaping Use: Never used  Substance Use Topics  . Alcohol use: Yes    Alcohol/week: 0.0 standard drinks    Comment: wine -every 3-4 months  . Drug use: No   Family History  Problem Relation Age of Onset  . Cardiomyopathy Father   . Diabetes Father   . Stroke  Father   . Anxiety disorder Mother   . Lupus Mother   . Emphysema Mother   . Coronary artery disease Mother   . Lung cancer Mother   . Hypertension Mother   . Heart disease Mother   . COPD Mother   . Kidney disease Mother   . Colon cancer Neg Hx   . Breast cancer Neg Hx   . Colon polyps Neg Hx   . Esophageal cancer Neg Hx   . Rectal cancer Neg Hx   . Stomach cancer Neg Hx    Allergies  Allergen Reactions  . Claritin [Loratadine] Other (See Comments)    Felt jittery  . Penicillins     REACTION: ? allergy  . Pseudoephedrine     REACTION: makes her hyper   Current Outpatient Medications on File Prior to Visit  Medication Sig Dispense Refill  . Cetirizine HCl 10 MG CAPS Take 1 capsule by mouth daily as needed.     . Triamcinolone Acetonide (NASACORT ALLERGY 24HR NA) Place 2 sprays into the nose daily.    Marland Kitchen triamcinolone cream (KENALOG) 0.1 % Apply 1 application topically 2 (two) times daily. 30 g 0  . Wheat Dextrin (BENEFIBER PO) Take by mouth daily.     No current facility-administered medications on file prior to visit.    Review of Systems  Constitutional: Negative for activity change, appetite change, fatigue, fever and unexpected weight change.  HENT: Negative for congestion, ear pain, rhinorrhea, sinus pressure and sore throat.   Eyes: Negative for pain, redness and visual disturbance.  Respiratory: Negative for cough, shortness of breath and wheezing.   Cardiovascular: Negative for chest pain and palpitations.  Gastrointestinal: Positive for abdominal pain. Negative for abdominal distention, anal bleeding, blood in stool, constipation, diarrhea, nausea, rectal pain and vomiting.  Endocrine: Negative for polydipsia and polyuria.  Genitourinary: Negative for dysuria, frequency and urgency.  Musculoskeletal: Negative for arthralgias, back pain and myalgias.  Skin: Negative for pallor and rash.  Allergic/Immunologic: Negative for environmental allergies.  Neurological:  Negative for dizziness, syncope and headaches.  Hematological: Negative for adenopathy. Does not bruise/bleed easily.  Psychiatric/Behavioral: Negative for decreased concentration and dysphoric mood. The patient is not nervous/anxious.        Objective:   Physical Exam Constitutional:      General: She is not in acute distress.    Appearance: She is well-developed, normal weight and well-nourished.  HENT:     Head: Normocephalic and atraumatic.     Mouth/Throat:     Mouth: Oropharynx is clear and moist.  Eyes:     General: No scleral icterus.    Extraocular Movements: EOM normal.     Conjunctiva/sclera: Conjunctivae normal.     Pupils: Pupils are equal, round, and reactive to light.  Cardiovascular:  Rate and Rhythm: Normal rate and regular rhythm.     Heart sounds: Normal heart sounds.  Pulmonary:     Effort: Pulmonary effort is normal. No respiratory distress.     Breath sounds: Normal breath sounds. No wheezing or rales.  Abdominal:     General: Abdomen is flat. Bowel sounds are normal. There is no distension or abdominal bruit. There are no signs of injury.     Palpations: Abdomen is soft. There is no hepatomegaly, splenomegaly, mass or pulsatile mass.     Tenderness: There is abdominal tenderness in the right upper quadrant, epigastric area and left upper quadrant. There is no right CVA tenderness, left CVA tenderness, guarding or rebound. Negative signs include McBurney's sign.     Hernia: No hernia is present.  Musculoskeletal:     Cervical back: Normal range of motion and neck supple.  Lymphadenopathy:     Cervical: No cervical adenopathy.  Skin:    General: Skin is warm and dry.     Coloration: Skin is not pale.     Findings: No erythema.  Neurological:     Mental Status: She is alert.  Psychiatric:        Mood and Affect: Mood and affect and mood normal.           Assessment & Plan:   Problem List Items Addressed This Visit      Other   Abdominal  pain, left upper quadrant - Primary    Disc possible gastritis  Tenderness worse in LUQ Px pepcid 20 mg bid to start now  Labs now incl lipase  abd xr now -?if possible constipation exacerbating symptoms  ua is clear  inst to call if symptoms worsen or change        Relevant Orders   DG Abd 1 View (Completed)   Basic metabolic panel   CBC with Differential/Platelet (Completed)   Renal function panel (Completed)   Lipase (Completed)   POCT Urinalysis Dipstick (Automated) (Completed)    Other Visit Diagnoses    Need for influenza vaccination       Relevant Orders   Flu Vaccine QUAD High Dose(Fluad) (Completed)

## 2020-05-08 NOTE — Assessment & Plan Note (Signed)
Disc possible gastritis  Tenderness worse in LUQ Px pepcid 20 mg bid to start now  Labs now incl lipase  abd xr now -?if possible constipation exacerbating symptoms  ua is clear  inst to call if symptoms worsen or change

## 2020-05-08 NOTE — Patient Instructions (Addendum)
Take pepcid 20 mg twice daily for stomach acid   Watch diet   Labs today  Abdominal xray today    Flu shot today   If symptoms suddenly worsen please let us know and go to the ER

## 2020-07-16 ENCOUNTER — Telehealth: Payer: Self-pay | Admitting: Family Medicine

## 2020-07-16 DIAGNOSIS — R7303 Prediabetes: Secondary | ICD-10-CM

## 2020-07-16 DIAGNOSIS — E785 Hyperlipidemia, unspecified: Secondary | ICD-10-CM

## 2020-07-16 DIAGNOSIS — Z0000 Encounter for general adult medical examination without abnormal findings: Secondary | ICD-10-CM

## 2020-07-16 NOTE — Telephone Encounter (Signed)
-----   Message from Cloyd Stagers, RT sent at 07/13/2020  1:06 PM EST ----- Regarding: Lab Orders for Wednesday 2.23.2022 Please place lab orders for Wednesday 2.23.2022, office visit for physical on Wednesday 3.2.2022 Thank you, Dyke Maes RT(R)

## 2020-07-18 ENCOUNTER — Other Ambulatory Visit: Payer: Self-pay

## 2020-07-18 ENCOUNTER — Other Ambulatory Visit (INDEPENDENT_AMBULATORY_CARE_PROVIDER_SITE_OTHER): Payer: Medicare Other

## 2020-07-18 DIAGNOSIS — R7303 Prediabetes: Secondary | ICD-10-CM | POA: Diagnosis not present

## 2020-07-18 DIAGNOSIS — E785 Hyperlipidemia, unspecified: Secondary | ICD-10-CM

## 2020-07-18 DIAGNOSIS — Z0000 Encounter for general adult medical examination without abnormal findings: Secondary | ICD-10-CM

## 2020-07-18 LAB — COMPREHENSIVE METABOLIC PANEL
ALT: 22 U/L (ref 0–35)
AST: 24 U/L (ref 0–37)
Albumin: 4.3 g/dL (ref 3.5–5.2)
Alkaline Phosphatase: 83 U/L (ref 39–117)
BUN: 18 mg/dL (ref 6–23)
CO2: 32 mEq/L (ref 19–32)
Calcium: 9.8 mg/dL (ref 8.4–10.5)
Chloride: 101 mEq/L (ref 96–112)
Creatinine, Ser: 0.85 mg/dL (ref 0.40–1.20)
GFR: 71.98 mL/min (ref >60.00)
Glucose, Bld: 92 mg/dL (ref 70–99)
Potassium: 4.5 mEq/L (ref 3.5–5.1)
Sodium: 139 mEq/L (ref 135–145)
Total Bilirubin: 0.5 mg/dL (ref 0.2–1.2)
Total Protein: 7.1 g/dL (ref 6.0–8.3)

## 2020-07-18 LAB — LIPID PANEL
Cholesterol: 217 mg/dL — ABNORMAL HIGH (ref 0–200)
HDL: 54.2 mg/dL (ref >39.00)
LDL Cholesterol: 133 mg/dL — ABNORMAL HIGH (ref 0–99)
NonHDL: 162.3
Total CHOL/HDL Ratio: 4
Triglycerides: 146 mg/dL (ref 0.0–149.0)
VLDL: 29.2 mg/dL (ref 0.0–40.0)

## 2020-07-18 LAB — CBC WITH DIFFERENTIAL/PLATELET
Basophils Absolute: 0 10*3/uL (ref 0.0–0.1)
Basophils Relative: 0.4 % (ref 0.0–3.0)
Eosinophils Absolute: 0.1 10*3/uL (ref 0.0–0.7)
Eosinophils Relative: 2.6 % (ref 0.0–5.0)
HCT: 43.5 % (ref 36.0–46.0)
Hemoglobin: 14.7 g/dL (ref 12.0–15.0)
Lymphocytes Relative: 37.2 % (ref 12.0–46.0)
Lymphs Abs: 1.7 10*3/uL (ref 0.7–4.0)
MCHC: 33.8 g/dL (ref 30.0–36.0)
MCV: 86.1 fl (ref 78.0–100.0)
Monocytes Absolute: 0.3 10*3/uL (ref 0.1–1.0)
Monocytes Relative: 7.2 % (ref 3.0–12.0)
Neutro Abs: 2.5 10*3/uL (ref 1.4–7.7)
Neutrophils Relative %: 52.6 % (ref 43.0–77.0)
Platelets: 264 10*3/uL (ref 150.0–400.0)
RBC: 5.05 Mil/uL (ref 3.87–5.11)
RDW: 13.3 % (ref 11.5–15.5)
WBC: 4.7 10*3/uL (ref 4.0–10.5)

## 2020-07-18 LAB — HEMOGLOBIN A1C: Hgb A1c MFr Bld: 5.6 % (ref 4.6–6.5)

## 2020-07-18 LAB — TSH: TSH: 1.38 u[IU]/mL (ref 0.35–4.50)

## 2020-07-25 ENCOUNTER — Ambulatory Visit (INDEPENDENT_AMBULATORY_CARE_PROVIDER_SITE_OTHER): Payer: Medicare Other | Admitting: Family Medicine

## 2020-07-25 ENCOUNTER — Encounter: Payer: Self-pay | Admitting: Family Medicine

## 2020-07-25 ENCOUNTER — Other Ambulatory Visit: Payer: Self-pay

## 2020-07-25 VITALS — BP 132/80 | HR 77 | Temp 97.0°F | Ht 63.0 in | Wt 158.6 lb

## 2020-07-25 DIAGNOSIS — Z0000 Encounter for general adult medical examination without abnormal findings: Secondary | ICD-10-CM | POA: Diagnosis not present

## 2020-07-25 DIAGNOSIS — R7303 Prediabetes: Secondary | ICD-10-CM | POA: Diagnosis not present

## 2020-07-25 DIAGNOSIS — E2839 Other primary ovarian failure: Secondary | ICD-10-CM

## 2020-07-25 DIAGNOSIS — E785 Hyperlipidemia, unspecified: Secondary | ICD-10-CM | POA: Diagnosis not present

## 2020-07-25 DIAGNOSIS — R1012 Left upper quadrant pain: Secondary | ICD-10-CM

## 2020-07-25 NOTE — Assessment & Plan Note (Signed)
Much improved after stopping benefiber, inc fluids and taking miralax

## 2020-07-25 NOTE — Assessment & Plan Note (Signed)
Disc goals for lipids and reasons to control them Rev last labs with pt Rev low sat fat diet in detail LDL is up slt to 133  Diet is fairly good  Continue to follow  This may be genetic

## 2020-07-25 NOTE — Patient Instructions (Addendum)
If you are interested in the new shingles vaccine (Shingrix) - call your local pharmacy to check on coverage and availability  If affordable, get on a wait list at your pharmacy to get the vaccine.  Call to schedule your bone density test at the [facility] location   Try to get 1200-1500 mg of calcium per day with at least 1000 iu of vitamin D - for bone health If calcium constipates you then just take the vitamin D   For memory and cognition- stay social and physically/mentally active  Check out the book Keep Sharp by Coral Spikes   For cholesterol Avoid red meat/ fried foods/ egg yolks/ fatty breakfast meats/ butter, cheese and high fat dairy/ and shellfish    For diabetes prevention  Try to get most of your carbohydrates from produce (with the exception of white potatoes)  Eat less bread/pasta/rice/snack foods/cereals/sweets and other items from the middle of the grocery store (processed carbs)  Get more protein in your meals

## 2020-07-25 NOTE — Assessment & Plan Note (Signed)
Lab Results  Component Value Date   HGBA1C 5.6 07/18/2020   Well controlled with diet  disc imp of low glycemic diet and wt loss to prevent DM2

## 2020-07-25 NOTE — Progress Notes (Signed)
Subjective:    Patient ID: Tracy Blankenship, female    DOB: Mar 03, 1956, 65 y.o.   MRN: [id number]  This visit occurred during the SARS-CoV-2 public health emergency.  Safety protocols were in place, including screening questions prior to the visit, additional usage of staff PPE, and extensive cleaning of exam room while observing appropriate contact time as indicated for disinfecting solutions.    HPI Pt presents for welcome to medicare visit with f/u of chronic health problems   I have personally reviewed the Medicare Annual Wellness questionnaire and have noted 1. The patient's medical and social history 2. Their use of alcohol, tobacco or illicit drugs 3. Their current medications and supplements 4. The patient's functional ability including ADL's, fall risks, home safety risks and hearing or visual             impairment. 5. Diet and physical activities 6. Evidence for depression or mood disorders  The patients weight, height, BMI have been recorded in the chart and visual acuity is per eye clinic.  I have made referrals, counseling and provided education to the patient based review of the above and I have provided the pt with a written personalized care plan for preventive services. Reviewed and updated provider list, see scanned forms.  See scanned forms.  Routine anticipatory guidance given to patient.  See health maintenance. Colon cancer screening  Colonoscopy 4/19 with 5 y recall  Breast cancer screening  Mammogram 8/21 Self breast exam-no lumps  Pap 9/19 negative Flu vaccine 12/21 Tetanus vaccine 12/13 Tdap Pneumovax-wants prevnar covid status -immunized  Zoster vaccine-interested  Dexa-per pt she had one years ago-nl, sister has OP  Falls-none Fractures- none  Supplements- occ takes ca and D Exercise - active lifestyle, lifts /moves furniture and paints   Advance directive-has up to date  Cognitive function addressed- see scanned forms- and if abnormal then  additional documentation follows.   Once in a while she feels a change/ memory -not significant  Worse when tired or eating improperly   PMH and SH reviewed  Meds, vitals, and allergies reviewed.   ROS: See HPI.  Otherwise negative.    Weight : Wt Readings from Last 3 Encounters:  07/25/20 158 lb 9 oz (71.9 kg)  05/08/20 160 lb 5 oz (72.7 kg)  02/17/19 165 lb 8 oz (75.1 kg)   28.09 kg/m   Hearing/vision:  Hearing Screening   125Hz  250Hz  500Hz  1000Hz  2000Hz  3000Hz  4000Hz  6000Hz  8000Hz   Right ear:   40 40 40  0    Left ear:   40 40 40  0      Visual Acuity Screening   Right eye Left eye Both eyes  Without correction:     With correction: 20/20 20/20 20/20    She had visit to ENT about vertigo and hearing test was fine  Regular eye care    BP Readings from Last 3 Encounters:  07/25/20 132/80  05/08/20 (!) 142/86  02/17/19 138/70    Pulse Readings from Last 3 Encounters:  07/25/20 77  05/08/20 90  02/17/19 (!) 105    Care team: Lytle Michaels- ENT Pyrtle- GI  Hyperlipidemia Lab Results  Component Value Date   CHOL 217 (H) 07/18/2020   CHOL 194 11/11/2019   CHOL 212 (H) 02/15/2019   Lab Results  Component Value Date   HDL 54.20 07/18/2020   HDL 50.50 11/11/2019   HDL 50.80 02/15/2019   Lab Results  Component Value Date   LDLCALC 133 (H)  07/18/2020   LDLCALC 111 (H) 11/11/2019   LDLCALC 131 (H) 02/15/2019   Lab Results  Component Value Date   TRIG 146.0 07/18/2020   TRIG 159.0 (H) 11/11/2019   TRIG 151.0 (H) 02/15/2019   Lab Results  Component Value Date   CHOLHDL 4 07/18/2020   CHOLHDL 4 11/11/2019   CHOLHDL 4 02/15/2019   Lab Results  Component Value Date   LDLDIRECT 126.5 12/17/2009   LDL is up mildly at 133  Diet is overall better than in the past  Eating fiber/produce and oatmeal  No fast food    Prediabetes Lab Results  Component Value Date   HGBA1C 5.6 07/18/2020  down from 5.7  Eating better  Has occ hypoglycemia      Other labs Lab Results  Component Value Date   CREATININE 0.85 07/18/2020   BUN 18 07/18/2020   NA 139 07/18/2020   K 4.5 07/18/2020   CL 101 07/18/2020   CO2 32 07/18/2020   Lab Results  Component Value Date   ALT 22 07/18/2020   AST 24 07/18/2020   ALKPHOS 83 07/18/2020   BILITOT 0.5 07/18/2020   Lab Results  Component Value Date   WBC 4.7 07/18/2020   HGB 14.7 07/18/2020   HCT 43.5 07/18/2020   MCV 86.1 07/18/2020   PLT 264.0 07/18/2020   Lab Results  Component Value Date   TSH 1.38 07/18/2020     GI problems she had in December Thinks benefiber without enough water was the cause  (stopped that)  She tried miralax  Patient Active Problem List   Diagnosis Date Noted  . Welcome to Medicare preventive visit 07/25/2020  . Estrogen deficiency 07/25/2020  . Vertigo 02/28/2019  . Low back pain 06/17/2018  . Right hip pain 06/17/2018  . Eczema 02/09/2017  . Mild hyperlipidemia 02/08/2016  . H/O herpes zoster 01/31/2014  . Vaginal dryness 10/29/2012  . Abdominal pain, left upper quadrant 04/29/2011  . Encounter for routine gynecological examination 04/22/2011  . Routine general medical examination at a health care facility 03/30/2011  . Urinary incontinence 01/07/2011  . Allergic rhinitis 12/21/2009  . INTERSTITIAL CYSTITIS 12/08/2006  . Prediabetes 12/08/2006   Past Medical History:  Diagnosis Date  . Allergy   . Chronic sinusitis   . Hyperglycemia   . IC (interstitial cystitis)   . Plantar fasciitis   . Stress reaction   . Trigger finger    Past Surgical History:  Procedure Laterality Date  . APPENDECTOMY    . CESAREAN SECTION     X 2  . WISDOM TOOTH EXTRACTION     Social History   Tobacco Use  . Smoking status: Never Smoker  . Smokeless tobacco: Never Used  Vaping Use  . Vaping Use: Never used  Substance Use Topics  . Alcohol use: Yes    Alcohol/week: 0.0 standard drinks    Comment: wine -every 3-4 months  . Drug use: No   Family  History  Problem Relation Age of Onset  . Cardiomyopathy Father   . Diabetes Father   . Stroke Father   . Anxiety disorder Mother   . Lupus Mother   . Emphysema Mother   . Coronary artery disease Mother   . Lung cancer Mother   . Hypertension Mother   . Heart disease Mother   . COPD Mother   . Kidney disease Mother   . Colon cancer Neg Hx   . Breast cancer Neg Hx   . Colon polyps  Neg Hx   . Esophageal cancer Neg Hx   . Rectal cancer Neg Hx   . Stomach cancer Neg Hx    Allergies  Allergen Reactions  . Claritin [Loratadine] Other (See Comments)    Felt jittery  . Penicillins     REACTION: ? allergy  . Pseudoephedrine     REACTION: makes her hyper   Current Outpatient Medications on File Prior to Visit  Medication Sig Dispense Refill  . Cetirizine HCl 10 MG CAPS Take 1 capsule by mouth daily as needed.     . famotidine (PEPCID) 20 MG tablet Take 1 tablet (20 mg total) by mouth 2 (two) times daily. 60 tablet 3  . Triamcinolone Acetonide (NASACORT ALLERGY 24HR NA) Place 2 sprays into the nose daily.    Marland Kitchen triamcinolone cream (KENALOG) 0.1 % Apply 1 application topically 2 (two) times daily. 30 g 0   No current facility-administered medications on file prior to visit.    Review of Systems  Constitutional: Negative for activity change, appetite change, fatigue, fever and unexpected weight change.  HENT: Negative for congestion, ear pain, rhinorrhea, sinus pressure and sore throat.   Eyes: Negative for pain, redness and visual disturbance.  Respiratory: Negative for cough, shortness of breath and wheezing.   Cardiovascular: Negative for chest pain and palpitations.  Gastrointestinal: Negative for abdominal pain, blood in stool, constipation and diarrhea.  Endocrine: Negative for polydipsia and polyuria.  Genitourinary: Negative for dysuria, frequency and urgency.  Musculoskeletal: Negative for arthralgias, back pain and myalgias.  Skin: Negative for pallor and rash.   Allergic/Immunologic: Negative for environmental allergies.  Neurological: Negative for dizziness, syncope and headaches.  Hematological: Negative for adenopathy. Does not bruise/bleed easily.  Psychiatric/Behavioral: Negative for decreased concentration and dysphoric mood. The patient is not nervous/anxious.        Objective:   Physical Exam Constitutional:      General: She is not in acute distress.    Appearance: Normal appearance. She is well-developed and normal weight. She is not ill-appearing or diaphoretic.  HENT:     Head: Normocephalic and atraumatic.     Right Ear: Tympanic membrane, ear canal and external ear normal.     Left Ear: Tympanic membrane, ear canal and external ear normal.     Nose: Nose normal. No congestion.     Mouth/Throat:     Mouth: Mucous membranes are moist.     Pharynx: Oropharynx is clear. No posterior oropharyngeal erythema.  Eyes:     General: No scleral icterus.    Extraocular Movements: Extraocular movements intact.     Conjunctiva/sclera: Conjunctivae normal.     Pupils: Pupils are equal, round, and reactive to light.  Neck:     Thyroid: No thyromegaly.     Vascular: No carotid bruit or JVD.  Cardiovascular:     Rate and Rhythm: Normal rate and regular rhythm.     Pulses: Normal pulses.     Heart sounds: Normal heart sounds. No gallop.   Pulmonary:     Effort: Pulmonary effort is normal. No respiratory distress.     Breath sounds: Normal breath sounds. No wheezing.     Comments: Good air exch Chest:     Chest wall: No tenderness.  Abdominal:     General: Bowel sounds are normal. There is no distension or abdominal bruit.     Palpations: Abdomen is soft. There is no mass.     Tenderness: There is no abdominal tenderness.     Hernia:  No hernia is present.  Genitourinary:    Comments: Breast exam: No mass, nodules, thickening, tenderness, bulging, retraction, inflamation, nipple discharge or skin changes noted.  No axillary or  clavicular LA.     Musculoskeletal:        General: No tenderness. Normal range of motion.     Cervical back: Normal range of motion and neck supple. No rigidity. No muscular tenderness.     Right lower leg: No edema.     Left lower leg: No edema.  Lymphadenopathy:     Cervical: No cervical adenopathy.  Skin:    General: Skin is warm and dry.     Coloration: Skin is not pale.     Findings: No erythema or rash.     Comments: Solar lentigines diffusely   Neurological:     Mental Status: She is alert. Mental status is at baseline.     Cranial Nerves: No cranial nerve deficit.     Motor: No abnormal muscle tone.     Coordination: Coordination normal.     Gait: Gait normal.     Deep Tendon Reflexes: Reflexes are normal and symmetric. Reflexes normal.  Psychiatric:        Mood and Affect: Mood normal.        Cognition and Memory: Cognition and memory normal.           Assessment & Plan:   Problem List Items Addressed This Visit      Other   Prediabetes    Lab Results  Component Value Date   HGBA1C 5.6 07/18/2020   Well controlled with diet  disc imp of low glycemic diet and wt loss to prevent DM2       Abdominal pain, left upper quadrant    Much improved after stopping benefiber, inc fluids and taking miralax      Mild hyperlipidemia    Disc goals for lipids and reasons to control them Rev last labs with pt Rev low sat fat diet in detail LDL is up slt to 133  Diet is fairly good  Continue to follow  This may be genetic      Welcome to [employer] preventive visit - Primary    Reviewed health habits including diet and exercise and skin cancer prevention Reviewed appropriate screening tests for age  Also reviewed health mt list, fam hx and immunization status , as well as social and family history   See HPI Labs reviewed  Colon anc breast cancer screen are utd  Will come back for prevnar vaccine  Interested in shingrix and will check on coverage dexa ordered ,  taking ca and D , enc to take regularly  Adv directive is utd  Nl cognitive concerns Hearing eval recent was reassuring  Nl vision screen /has regular eye care  bp better on 2nd check        Estrogen deficiency   Relevant Orders   DG Bone Density

## 2020-07-25 NOTE — Assessment & Plan Note (Signed)
Reviewed health habits including diet and exercise and skin cancer prevention Reviewed appropriate screening tests for age  Also reviewed health mt list, fam hx and immunization status , as well as social and family history   See HPI Labs reviewed  Colon anc breast cancer screen are utd  Will come back for prevnar vaccine  Interested in shingrix and will check on coverage dexa ordered , taking ca and D , enc to take regularly  Adv directive is utd  Nl cognitive concerns Hearing eval recent was reassuring  Nl vision screen /has regular eye care  bp better on 2nd check

## 2020-07-31 ENCOUNTER — Ambulatory Visit: Payer: Medicare Other

## 2020-07-31 ENCOUNTER — Other Ambulatory Visit: Payer: Self-pay

## 2020-08-01 ENCOUNTER — Inpatient Hospital Stay: Admission: RE | Admit: 2020-08-01 | Payer: Medicare Other | Source: Ambulatory Visit

## 2020-08-07 ENCOUNTER — Other Ambulatory Visit: Payer: Self-pay

## 2020-08-07 ENCOUNTER — Ambulatory Visit (INDEPENDENT_AMBULATORY_CARE_PROVIDER_SITE_OTHER): Payer: Medicare Other | Admitting: *Deleted

## 2020-08-07 DIAGNOSIS — Z23 Encounter for immunization: Secondary | ICD-10-CM | POA: Diagnosis not present

## 2020-08-15 ENCOUNTER — Ambulatory Visit (INDEPENDENT_AMBULATORY_CARE_PROVIDER_SITE_OTHER)
Admission: RE | Admit: 2020-08-15 | Discharge: 2020-08-15 | Disposition: A | Discharge status: Home / Self Care | Payer: Medicare Other | Source: Ambulatory Visit | Attending: Family Medicine | Admitting: Family Medicine

## 2020-08-15 ENCOUNTER — Other Ambulatory Visit: Payer: Self-pay

## 2020-08-15 DIAGNOSIS — E2839 Other primary ovarian failure: Secondary | ICD-10-CM | POA: Diagnosis not present

## 2020-08-16 ENCOUNTER — Encounter: Payer: Self-pay | Admitting: Family Medicine

## 2020-08-17 ENCOUNTER — Telehealth: Payer: Self-pay | Admitting: *Deleted

## 2020-08-17 NOTE — Telephone Encounter (Signed)
There are not provider comments on pt's DEXA yet but pt has viewed it on mychart and sent a message saying:  Please let me know what these results mean.  I'm  concerned about the  "statistically significant"   number.  I started calcium and Vitamin D this morning.

## 2020-08-18 NOTE — Telephone Encounter (Signed)
See result note, thanks.

## 2020-08-20 NOTE — Telephone Encounter (Signed)
Responded to pt's mychart and let her know Dr. Marliss Coots comments

## 2020-12-03 ENCOUNTER — Other Ambulatory Visit: Payer: Self-pay | Admitting: Family Medicine

## 2020-12-03 DIAGNOSIS — Z1231 Encounter for screening mammogram for malignant neoplasm of breast: Secondary | ICD-10-CM

## 2021-01-24 ENCOUNTER — Ambulatory Visit: Payer: Medicare Other

## 2021-02-11 ENCOUNTER — Other Ambulatory Visit: Payer: Self-pay

## 2021-02-11 ENCOUNTER — Ambulatory Visit
Admission: RE | Admit: 2021-02-11 | Discharge: 2021-02-11 | Disposition: A | Discharge status: Home / Self Care | Payer: Medicare Other | Source: Ambulatory Visit | Attending: Family Medicine | Admitting: Family Medicine

## 2021-02-11 DIAGNOSIS — Z1231 Encounter for screening mammogram for malignant neoplasm of breast: Secondary | ICD-10-CM

## 2021-06-04 ENCOUNTER — Ambulatory Visit (INDEPENDENT_AMBULATORY_CARE_PROVIDER_SITE_OTHER): Payer: Medicare PPO | Admitting: Family Medicine

## 2021-06-04 ENCOUNTER — Other Ambulatory Visit: Payer: Self-pay

## 2021-06-04 ENCOUNTER — Encounter: Payer: Self-pay | Admitting: Family Medicine

## 2021-06-04 DIAGNOSIS — U071 COVID-19: Secondary | ICD-10-CM | POA: Insufficient documentation

## 2021-06-04 NOTE — Patient Instructions (Addendum)
Treat your symptoms   Rest  Drink more fluids   Isolate yourself until symptoms are better  Then mask for minimum of 10 days   Update if not starting to improve in a week or if worsening

## 2021-06-04 NOTE — Assessment & Plan Note (Signed)
covid positive-now 10 days in with relatively mild symptoms  Pt is immunized but without bivalent booster  Reassuring exam  Discussed symptom care Can continue dayquil or nyquil  Suggest adding nasal saline spray for congestion  Enc fluids and rest  Continue to isolate until symptoms are better  Then mask 10 days minimum ER precautions discussed  Update if not starting to improve in a week or if worsening

## 2021-06-04 NOTE — Progress Notes (Signed)
Subjective:    Patient ID: Tracy Blankenship, female    DOB: [birth year], 66 y.o.   MRN: [id number]  This visit occurred during the SARS-CoV-2 public health emergency.  Safety protocols were in place, including screening questions prior to the visit, additional usage of staff PPE, and extensive cleaning of exam room while observing appropriate contact time as indicated for disinfecting solutions.   HPI Pt presents for 10 days of sinus trouble   Wt Readings from Last 3 Encounters:  06/04/21 161 lb 6.4 oz (73.2 kg)  07/25/20 158 lb 9 oz (71.9 kg)  05/08/20 160 lb 5 oz (72.7 kg)   28.59 kg/m   Had negative covid test at home , then one she thought was negative but upon review today is actually positive  Feels tired /washed out on and off   Cough is dry /no chest congestion  Post nasal drip  Congested  Head feels stuffy / some frontal headache   No wheezing  No sob  Not a lot of mucous/not green /is clear   No fever  No body aches or chills    Otc:  Day quil and nyquil  Occ aleve  Saline    Needs to drink more fluids/tea  Not taking zyrtec right now  Still uses nasacort      ,   occ afrin (very seldom)    Immunized for covid , has not had bivalent booster yet   Patient Active Problem List   Diagnosis Date Noted   COVID-19 06/04/2021   Welcome to Medicare preventive visit 07/25/2020   Estrogen deficiency 07/25/2020   Vertigo 02/28/2019   Low back pain 06/17/2018   Right hip pain 06/17/2018   Eczema 02/09/2017   Mild hyperlipidemia 02/08/2016   H/O herpes zoster 01/31/2014   Vaginal dryness 10/29/2012   Abdominal pain, left upper quadrant 04/29/2011   Encounter for routine gynecological examination 04/22/2011   Routine general medical examination at a health care facility 03/30/2011   Urinary incontinence 01/07/2011   Allergic rhinitis 12/21/2009   INTERSTITIAL CYSTITIS 12/08/2006   Prediabetes 12/08/2006   Past Medical History:  Diagnosis Date   Allergy     Chronic sinusitis    Hyperglycemia    IC (interstitial cystitis)    Plantar fasciitis    Stress reaction    Trigger finger    Past Surgical History:  Procedure Laterality Date   APPENDECTOMY     CESAREAN SECTION     X 2   WISDOM TOOTH EXTRACTION     Social History   Tobacco Use   Smoking status: Never   Smokeless tobacco: Never  Vaping Use   Vaping Use: Never used  Substance Use Topics   Alcohol use: Yes    Alcohol/week: 0.0 standard drinks    Comment: wine -every 3-4 months   Drug use: No   Family History  Problem Relation Age of Onset   Cardiomyopathy Father    Diabetes Father    Stroke Father    Anxiety disorder Mother    Lupus Mother    Emphysema Mother    Coronary artery disease Mother    Lung cancer Mother    Hypertension Mother    Heart disease Mother    COPD Mother    Kidney disease Mother    Colon cancer Neg Hx    Breast cancer Neg Hx    Colon polyps Neg Hx    Esophageal cancer Neg Hx    Rectal cancer Neg Hx  Stomach cancer Neg Hx    Allergies  Allergen Reactions   Claritin [Loratadine] Other (See Comments)    Felt jittery   Penicillins     REACTION: ? allergy   Pseudoephedrine     REACTION: makes her hyper   Current Outpatient Medications on File Prior to Visit  Medication Sig Dispense Refill   polyethylene glycol (MIRALAX / GLYCOLAX) 17 g packet Take 17 g by mouth daily. 1/2 cap full once daily     Triamcinolone Acetonide (NASACORT ALLERGY 24HR NA) Place 2 sprays into the nose daily.     Cetirizine HCl 10 MG CAPS Take 1 capsule by mouth daily as needed.  (Patient not taking: Reported on 06/04/2021)     famotidine (PEPCID) 20 MG tablet Take 1 tablet (20 mg total) by mouth 2 (two) times daily. (Patient not taking: Reported on 06/04/2021) 60 tablet 3   No current facility-administered medications on file prior to visit.    Review of Systems  Constitutional:  Positive for fatigue. Negative for activity change, appetite change, fever and  unexpected weight change.  HENT:  Positive for congestion, rhinorrhea and sinus pressure. Negative for ear pain, sore throat and voice change.   Eyes:  Negative for pain, redness and visual disturbance.  Respiratory:  Positive for cough. Negative for shortness of breath, wheezing and stridor.   Cardiovascular:  Negative for chest pain and palpitations.  Gastrointestinal:  Negative for abdominal pain, blood in stool, constipation and diarrhea.  Endocrine: Negative for polydipsia and polyuria.  Genitourinary:  Negative for dysuria, frequency and urgency.  Musculoskeletal:  Negative for arthralgias, back pain and myalgias.  Skin:  Negative for pallor and rash.  Allergic/Immunologic: Negative for environmental allergies.  Neurological:  Negative for dizziness, syncope and headaches.  Hematological:  Negative for adenopathy. Does not bruise/bleed easily.  Psychiatric/Behavioral:  Negative for decreased concentration and dysphoric mood. The patient is not nervous/anxious.       Objective:   Physical Exam Constitutional:      General: She is not in acute distress.    Appearance: Normal appearance. She is well-developed and normal weight. She is not ill-appearing, toxic-appearing or diaphoretic.  HENT:     Head: Normocephalic and atraumatic.     Comments: No sinus tenderness    Right Ear: Tympanic membrane, ear canal and external ear normal.     Left Ear: Tympanic membrane, ear canal and external ear normal.     Nose: Congestion and rhinorrhea present.     Mouth/Throat:     Mouth: Mucous membranes are moist.     Pharynx: Oropharynx is clear. No oropharyngeal exudate or posterior oropharyngeal erythema.     Comments: Clear pnd  Eyes:     General:        Right eye: No discharge.        Left eye: No discharge.     Conjunctiva/sclera: Conjunctivae normal.     Pupils: Pupils are equal, round, and reactive to light.  Cardiovascular:     Rate and Rhythm: Tachycardia present.     Heart sounds:  Normal heart sounds.  Pulmonary:     Effort: Pulmonary effort is normal. No respiratory distress.     Breath sounds: Normal breath sounds. No stridor. No wheezing, rhonchi or rales.  Chest:     Chest wall: No tenderness.  Musculoskeletal:     Cervical back: Normal range of motion and neck supple.  Lymphadenopathy:     Cervical: No cervical adenopathy.  Skin:  General: Skin is warm and dry.     Capillary Refill: Capillary refill takes less than 2 seconds.     Findings: No rash.  Neurological:     Mental Status: She is alert.     Cranial Nerves: No cranial nerve deficit.  Psychiatric:        Mood and Affect: Mood normal.          Assessment & Plan:   Problem List Items Addressed This Visit       Other   COVID-19    covid positive-now 10 days in with relatively mild symptoms  Pt is immunized but without bivalent booster  Reassuring exam  Discussed symptom care Can continue dayquil or nyquil  Suggest adding nasal saline spray for congestion  Enc fluids and rest  Continue to isolate until symptoms are better  Then mask 10 days minimum ER precautions discussed  Update if not starting to improve in a week or if worsening

## 2021-06-17 IMAGING — MG DIGITAL SCREENING BILATERAL MAMMOGRAM WITH TOMO AND CAD
6 of 10 series · 6 of 30 positions shown · non-contrast
Comparison: Previous exam(s).

CLINICAL DATA: Screening.

EXAM:
DIGITAL SCREENING BILATERAL MAMMOGRAM WITH TOMO AND CAD

[R MLO synth-2D]
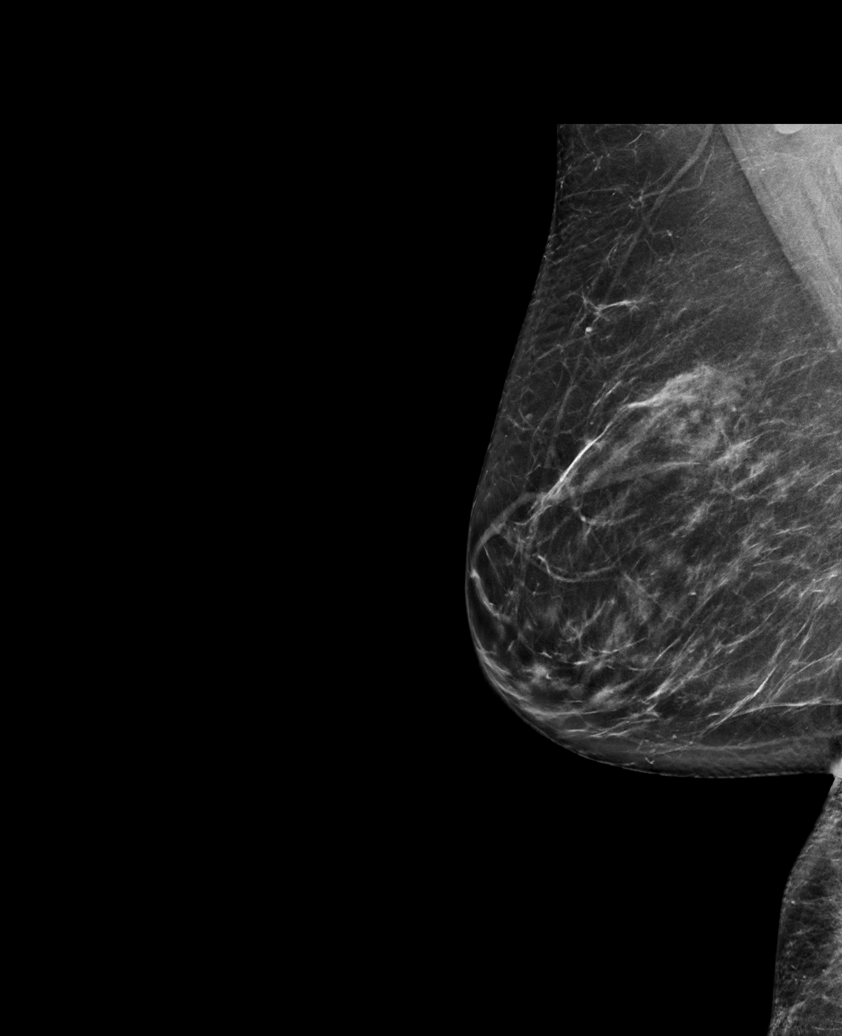

[L MLO synth-2D (1 of 2)]
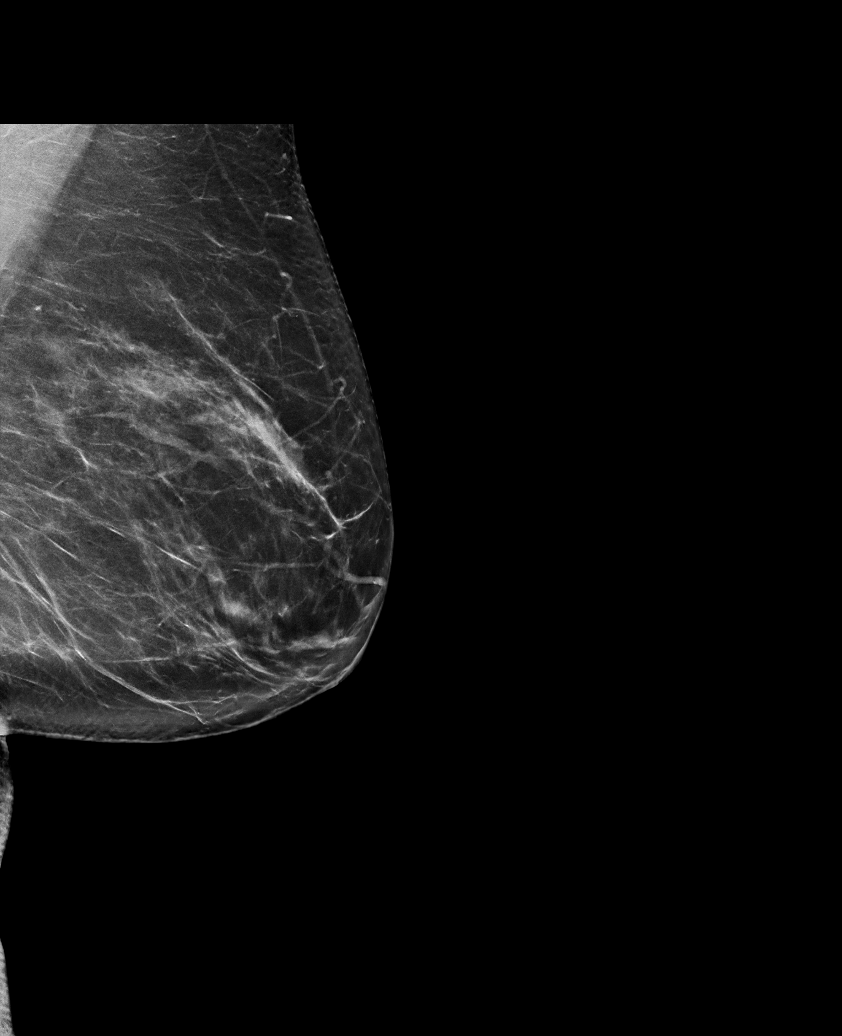

[L CC synth-2D]
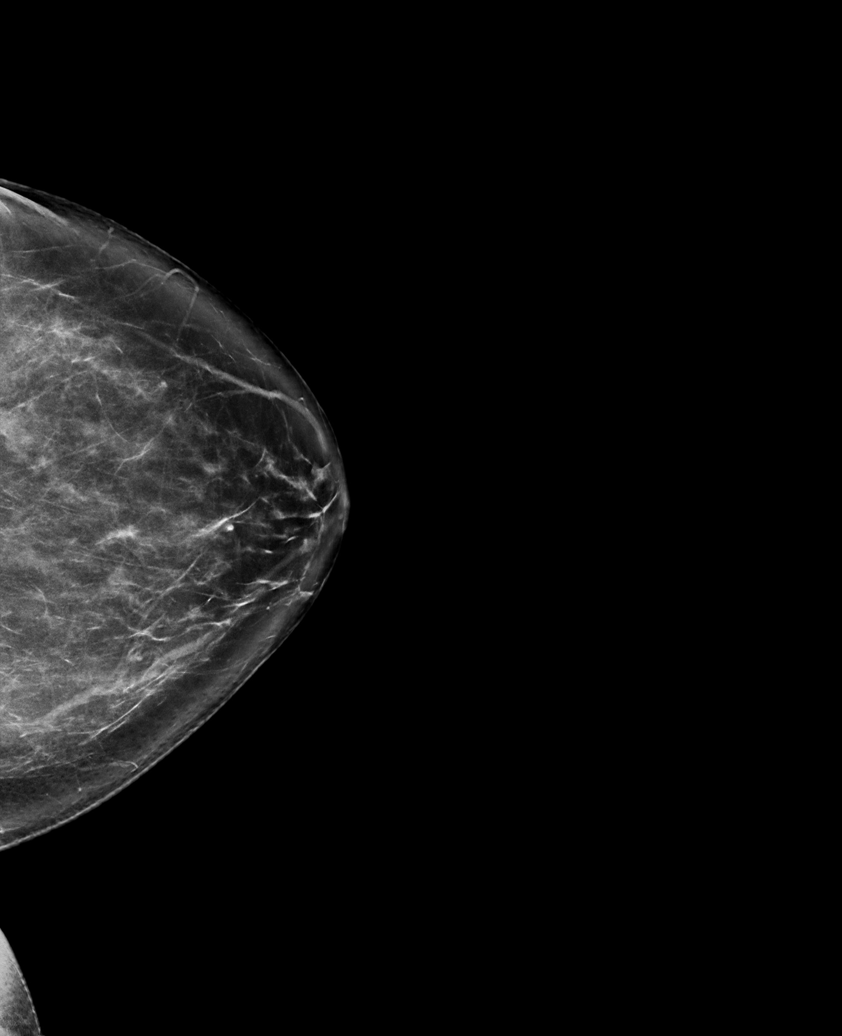

[R CC synth-2D]
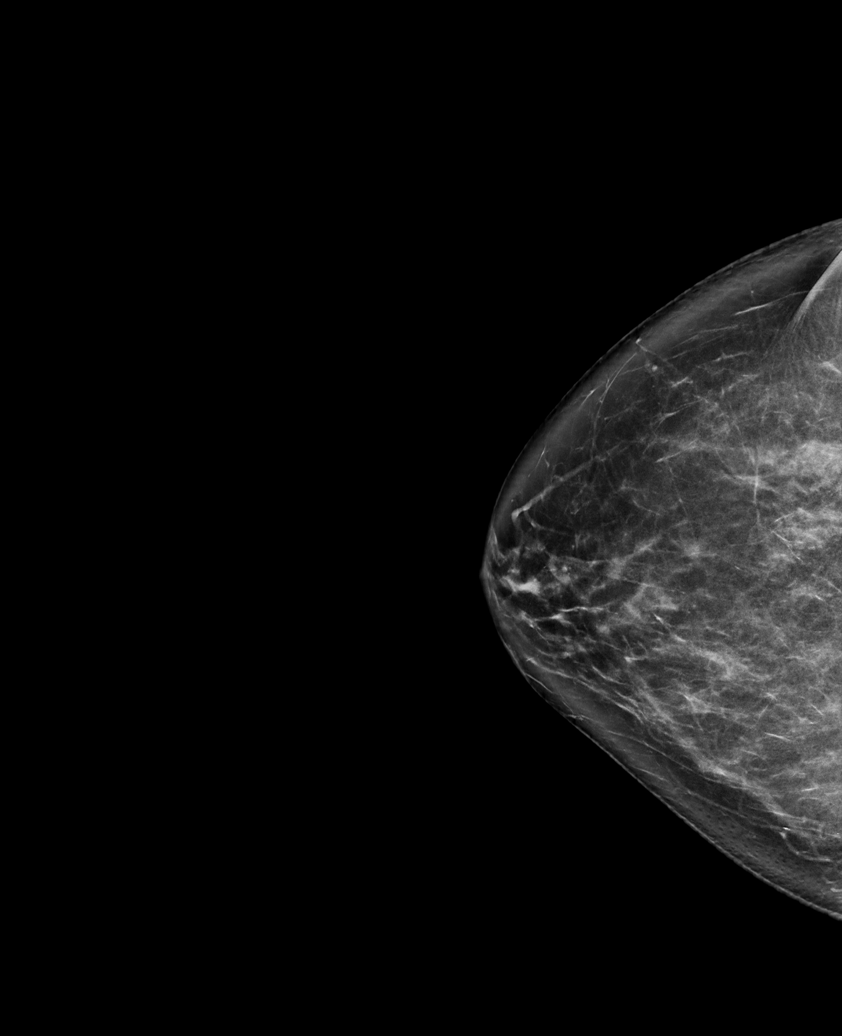

[L MLO synth-2D (2 of 2)]
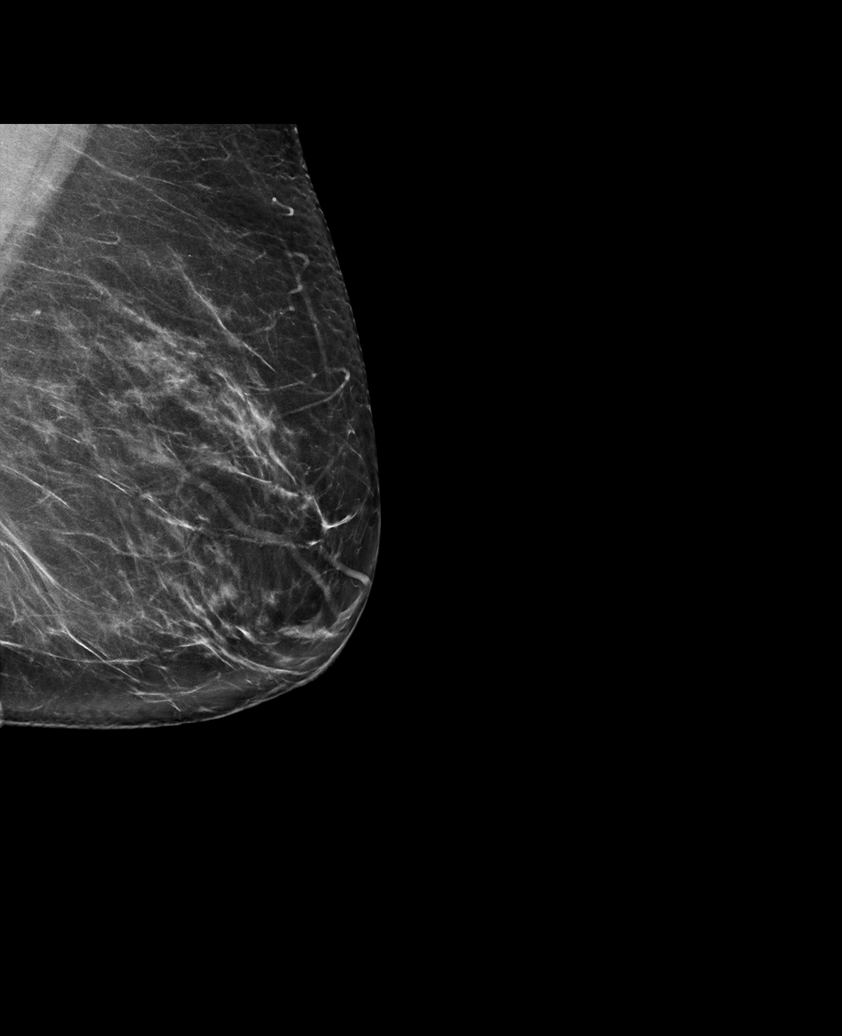

[L MLO tomo · tomo slice 41/80.0]
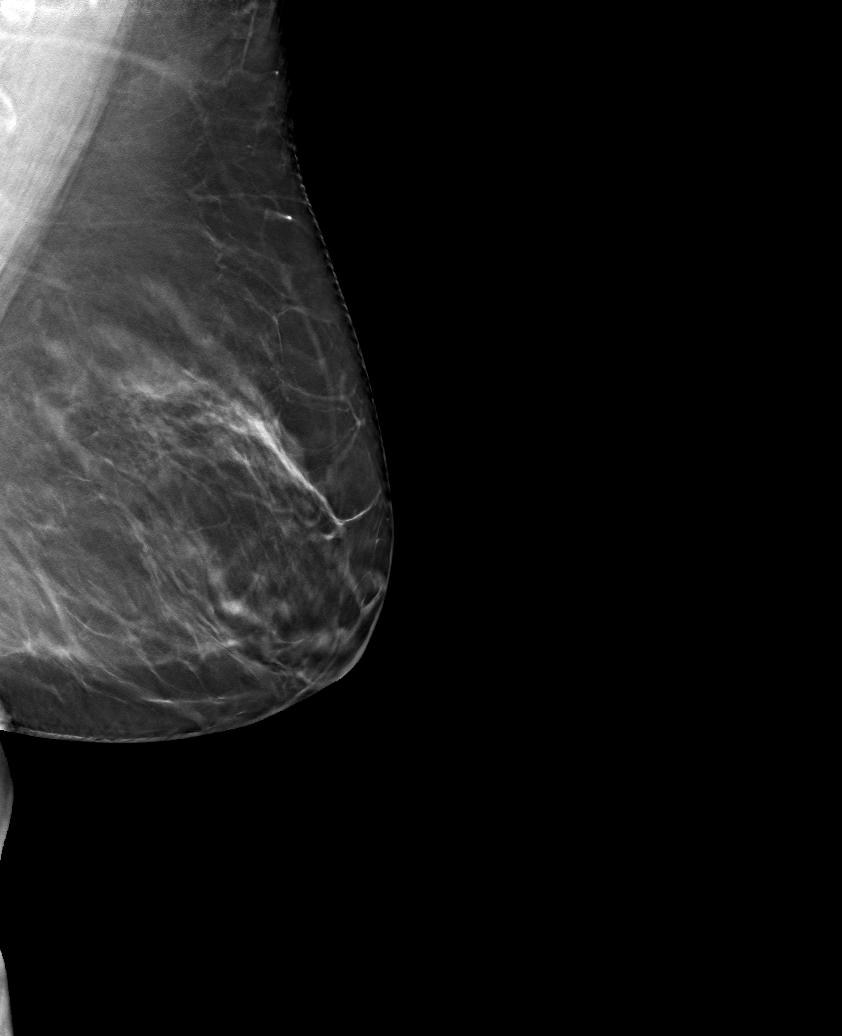

[6 of 30 positions shown; findings below may reference images not displayed]

ACR Breast Density Category b: There are scattered areas of
fibroglandular density.
FINDINGS: There are no findings suspicious for malignancy. Images were
processed with CAD.
IMPRESSION: No mammographic evidence of malignancy. A result letter of this
screening mammogram will be mailed directly to the patient.

RECOMMENDATION:
Screening mammogram in one year. (Code:[id number])

BI-RADS CATEGORY  1: Negative.

## 2021-07-22 ENCOUNTER — Telehealth: Payer: Self-pay | Admitting: Family Medicine

## 2021-07-22 DIAGNOSIS — R7303 Prediabetes: Secondary | ICD-10-CM

## 2021-07-22 DIAGNOSIS — Z0000 Encounter for general adult medical examination without abnormal findings: Secondary | ICD-10-CM

## 2021-07-22 DIAGNOSIS — E785 Hyperlipidemia, unspecified: Secondary | ICD-10-CM

## 2021-07-22 NOTE — Telephone Encounter (Signed)
-----   Message from Ellamae Sia sent at 07/12/2021  2:09 PM EST ----- Regarding: Lab orders for Tuesday, 2.28.23 .Patient is scheduled for CPX labs, please order future labs, Thanks , Karna Christmas

## 2021-07-23 ENCOUNTER — Other Ambulatory Visit (INDEPENDENT_AMBULATORY_CARE_PROVIDER_SITE_OTHER): Payer: Medicare PPO

## 2021-07-23 ENCOUNTER — Other Ambulatory Visit: Payer: Self-pay

## 2021-07-23 DIAGNOSIS — E785 Hyperlipidemia, unspecified: Secondary | ICD-10-CM | POA: Diagnosis not present

## 2021-07-23 DIAGNOSIS — R7303 Prediabetes: Secondary | ICD-10-CM | POA: Diagnosis not present

## 2021-07-23 DIAGNOSIS — Z0000 Encounter for general adult medical examination without abnormal findings: Secondary | ICD-10-CM | POA: Diagnosis not present

## 2021-07-23 LAB — CBC WITH DIFFERENTIAL/PLATELET
Basophils Absolute: 0 10*3/uL (ref 0.0–0.1)
Basophils Relative: 0.6 % (ref 0.0–3.0)
Eosinophils Absolute: 0.2 10*3/uL (ref 0.0–0.7)
Eosinophils Relative: 4.6 % (ref 0.0–5.0)
HCT: 42.4 % (ref 36.0–46.0)
Hemoglobin: 14 g/dL (ref 12.0–15.0)
Lymphocytes Relative: 38.9 % (ref 12.0–46.0)
Lymphs Abs: 1.6 10*3/uL (ref 0.7–4.0)
MCHC: 33.1 g/dL (ref 30.0–36.0)
MCV: 87.1 fl (ref 78.0–100.0)
Monocytes Absolute: 0.3 10*3/uL (ref 0.1–1.0)
Monocytes Relative: 7.4 % (ref 3.0–12.0)
Neutro Abs: 2 10*3/uL (ref 1.4–7.7)
Neutrophils Relative %: 48.5 % (ref 43.0–77.0)
Platelets: 271 10*3/uL (ref 150.0–400.0)
RBC: 4.86 Mil/uL (ref 3.87–5.11)
RDW: 14 % (ref 11.5–15.5)
WBC: 4.2 10*3/uL (ref 4.0–10.5)

## 2021-07-23 LAB — TSH: TSH: 1.1 u[IU]/mL (ref 0.35–5.50)

## 2021-07-23 LAB — COMPREHENSIVE METABOLIC PANEL
ALT: 19 U/L (ref 0–35)
AST: 24 U/L (ref 0–37)
Albumin: 4.4 g/dL (ref 3.5–5.2)
Alkaline Phosphatase: 76 U/L (ref 39–117)
BUN: 19 mg/dL (ref 6–23)
CO2: 30 mEq/L (ref 19–32)
Calcium: 9.5 mg/dL (ref 8.4–10.5)
Chloride: 103 mEq/L (ref 96–112)
Creatinine, Ser: 0.97 mg/dL (ref 0.40–1.20)
GFR: 60.99 mL/min (ref >60.00)
Glucose, Bld: 94 mg/dL (ref 70–99)
Potassium: 4.2 mEq/L (ref 3.5–5.1)
Sodium: 140 mEq/L (ref 135–145)
Total Bilirubin: 0.5 mg/dL (ref 0.2–1.2)
Total Protein: 7 g/dL (ref 6.0–8.3)

## 2021-07-23 LAB — LIPID PANEL
Cholesterol: 216 mg/dL — ABNORMAL HIGH (ref 0–200)
HDL: 56.7 mg/dL (ref >39.00)
LDL Cholesterol: 137 mg/dL — ABNORMAL HIGH (ref 0–99)
NonHDL: 158.99
Total CHOL/HDL Ratio: 4
Triglycerides: 110 mg/dL (ref 0.0–149.0)
VLDL: 22 mg/dL (ref 0.0–40.0)

## 2021-07-23 LAB — HEMOGLOBIN A1C: Hgb A1c MFr Bld: 5.6 % (ref 4.6–6.5)

## 2021-07-29 ENCOUNTER — Encounter: Payer: Self-pay | Admitting: Family Medicine

## 2021-07-29 ENCOUNTER — Ambulatory Visit (INDEPENDENT_AMBULATORY_CARE_PROVIDER_SITE_OTHER): Payer: Medicare PPO | Admitting: Family Medicine

## 2021-07-29 ENCOUNTER — Other Ambulatory Visit: Payer: Self-pay

## 2021-07-29 VITALS — BP 144/74 | HR 82 | Temp 98.0°F | Ht 63.0 in | Wt 161.1 lb

## 2021-07-29 DIAGNOSIS — E785 Hyperlipidemia, unspecified: Secondary | ICD-10-CM

## 2021-07-29 DIAGNOSIS — R7303 Prediabetes: Secondary | ICD-10-CM

## 2021-07-29 DIAGNOSIS — Z23 Encounter for immunization: Secondary | ICD-10-CM

## 2021-07-29 DIAGNOSIS — Z0000 Encounter for general adult medical examination without abnormal findings: Secondary | ICD-10-CM | POA: Diagnosis not present

## 2021-07-29 MED ORDER — FAMOTIDINE 20 MG PO TABS: 20.0000 mg | ORAL_TABLET | Freq: Two times a day (BID) | ORAL | 11 refills | Status: AC | PRN

## 2021-07-29 NOTE — Assessment & Plan Note (Signed)
Disc goals for lipids and reasons to control them ?Rev last labs with pt ?Rev low sat fat diet in detail ?LDL up slt but trig are down  ?Diet is improving  ?Disc need to stop eating bacon daily ?

## 2021-07-29 NOTE — Patient Instructions (Addendum)
If you are interested in the new shingles vaccine (Shingrix) - call your local pharmacy to check on coverage and availability  ?If affordable, get on a wait list at your pharmacy to get the vaccine. ? ?Wait a month from flu shot  ?Flu shot today  ? ?In the future if you want an audiology evaluation for hearing let me know  ? ?For cholesterol ?Avoid red meat/ fried foods/ egg yolks/ fatty breakfast meats/ butter, cheese and high fat dairy/ and shellfish   ? ? ?  ? ?

## 2021-07-29 NOTE — Assessment & Plan Note (Signed)
Lab Results  ?Component Value Date  ? HGBA1C 5.6 07/23/2021  ? ?disc imp of low glycemic diet and wt loss to prevent DM2  ?

## 2021-07-29 NOTE — Progress Notes (Signed)
Subjective:    Patient ID: Tracy Blankenship, female    DOB: 06/21/1955, 66 y.o.   MRN: [id number]  This visit occurred during the SARS-CoV-2 public health emergency.  Safety protocols were in place, including screening questions prior to the visit, additional usage of staff PPE, and extensive cleaning of exam room while observing appropriate contact time as indicated for disinfecting solutions.   HPI Pt presents for amw and health mt visit  I have personally reviewed the Medicare Annual Wellness questionnaire and have noted 1. The patient's medical and social history 2. Their use of alcohol, tobacco or illicit drugs 3. Their current medications and supplements 4. The patient's functional ability including ADL's, fall risks, home safety risks and hearing or visual             impairment. 5. Diet and physical activities 6. Evidence for depression or mood disorders  The patients weight, height, BMI have been recorded in the chart and visual acuity is per eye clinic.  I have made referrals, counseling and provided education to the patient based review of the above and I have provided the pt with a written personalized care plan for preventive services. Reviewed and updated provider list, see scanned forms.  See scanned forms.  Routine anticipatory guidance given to patient.  See health maintenance. Colon cancer screening  colonoscopy 09/2017 wit 5 y recall   due in 2024  Breast cancer screening  mammogram 01/2021  Self breast exam: no lumps  Flu vaccine : missed this year  Covid immunized and had covid Tetanus vaccine  Tdap 04/2012 Pneumovax had prevnar 13 on 08/07/20   to early for pna 23 Zoster vaccine: interested in shingrix , needs to see if covered  Pap utd 2019 nl with neg HPV  Dexa 07/2020  BMD in the normal range (had gone down from past however) Falls: tripped over mower   Is more mindful of falls  Fractures: none  Supplements- takes ca and D chew  Exercise : is active/re  finishes furniture (sand/paint/lifting/stooping)  That counts as wt bearing exercise   Advance directive- up to date Cognitive function addressed- see scanned forms- and if abnormal then additional documentation follows.   Has not noticed much change yet in cognition  May have had some covid brain  Has very good recall as a rule   Crosswords and reading  Does all the finances   PMH and SH reviewed  Meds, vitals, and allergies reviewed.   ROS: See HPI.  Otherwise negative.    Weight : Wt Readings from Last 3 Encounters:  07/29/21 161 lb 2 oz (73.1 kg)  06/04/21 161 lb 6.4 oz (73.2 kg)  07/25/20 158 lb 9 oz (71.9 kg)   28.54 kg/m    Hearing/vision: Hearing Screening   '500Hz'$  '1000Hz'$  '2000Hz'$  '4000Hz'$   Right ear 0 0 40 0  Left ear 0 40 40 40  Vision Screening - Comments:: Eye exam on 07/16/21 at [facility]. With Dr. Ellie Lunch  Right ear changes  Husband thinks she has a hearing problem /she disagrees    PHQ: Depression screen [facility] 2/9 07/29/2021 07/25/2020 02/17/2019 02/15/2018 02/09/2017  Decreased Interest 0 0 0 0 1  Down, Depressed, Hopeless 0 0 0 0 0  PHQ - 2 Score 0 0 0 0 1  Altered sleeping - - 0 - -  Tired, decreased energy - - 0 - -  Change in appetite - - 0 - -  Feeling bad or failure about yourself  - -  1 - -  Trouble concentrating - - 0 - -  Moving slowly or fidgety/restless - - 0 - -  Suicidal thoughts - - 0 - -  PHQ-9 Score - - 1 - -     ADLs: no help needed   Functionality: excellent   Care team  Saria[facility]kin[facility] - GI  Takes pepcid 20 mg bid   Uses miralax for constipation as needed  1/3 cap every day     Hyperlipidemia Lab Results  Component Value Date   CHOL 216 (H) 07/23/2021   CHOL 217 (H) 07/18/2020   CHOL 194 11/11/2019   Lab Results  Component Value Date   HDL 56.70 07/23/2021   HDL 54.20 07/18/2020   HDL 50.50 11/11/2019   Lab Results  Component Value Date   LDLCALC 137 (H) 07/23/2021   LDLCALC 133 (H) 07/18/2020    LDLCALC 111 (H) 11/11/2019   Lab Results  Component Value Date   TRIG 110.0 07/23/2021   TRIG 146.0 07/18/2020   TRIG 159.0 (H) 11/11/2019   Lab Results  Component Value Date   CHOLHDL 4 07/23/2021   CHOLHDL 4 07/18/2020   CHOLHDL 4 11/11/2019   Lab Results  Component Value Date   LDLDIRECT 126.5 12/17/2009   LDL is up a bit  Trig are down  Eating better /husband has to eat low fat  Made a good effort  More fruits and vegetable    Prediabetes Lab Results  Component Value Date   HGBA1C 5.6 07/23/2021  Stable Making effort to cut sugar from tea  More produce    Other labs Lab Results  Component Value Date   WBC 4.2 07/23/2021   HGB 14.0 07/23/2021   HCT 42.4 07/23/2021   MCV 87.1 07/23/2021   PLT 271.0 07/23/2021   Lab Results  Component Value Date   TSH 1.10 07/23/2021   Lab Results  Component Value Date   CREATININE 0.97 07/23/2021   BUN 19 07/23/2021   NA 140 07/23/2021   K 4.2 07/23/2021   CL 103 07/23/2021   CO2 30 07/23/2021   Lab Results  Component Value Date   ALT 19 07/23/2021   AST 24 07/23/2021   ALKPHOS 76 07/23/2021   BILITOT 0.5 07/23/2021    Patient Active Problem List   Diagnosis Date Noted   Medicare annual wellness visit, initial 07/25/2020   Estrogen deficiency 07/25/2020   Vertigo 02/28/2019   Low back pain 06/17/2018   Eczema 02/09/2017   Mild hyperlipidemia 02/08/2016   H/O herpes zoster 01/31/2014   Vaginal dryness 10/29/2012   Encounter for routine gynecological examination 04/22/2011   Routine general medical examination at a health care facility 03/30/2011   Urinary incontinence 01/07/2011   Allergic rhinitis 12/21/2009   INTERSTITIAL CYSTITIS 12/08/2006   Prediabetes 12/08/2006   Past Medical History:  Diagnosis Date   Allergy    Chronic sinusitis    Hyperglycemia    IC (interstitial cystitis)    Plantar fasciitis    Stress reaction    Trigger finger    Past Surgical History:  Procedure Laterality Date    APPENDECTOMY     CESAREAN SECTION     X 2   WISDOM TOOTH EXTRACTION     Social History   Tobacco Use   Smoking status: Never   Smokeless tobacco: Never  Vaping Use   Vaping Use: Never used  Substance Use Topics   Alcohol use: Yes    Alcohol/week: 0.0 standard drinks  Comment: wine -every 3-4 months   Drug use: No   Family History  Problem Relation Age of Onset   Cardiomyopathy Father    Diabetes Father    Stroke Father    Anxiety disorder Mother    Lupus Mother    Emphysema Mother    Coronary artery disease Mother    Lung cancer Mother    Hypertension Mother    Heart disease Mother    COPD Mother    Kidney disease Mother    Colon cancer Neg Hx    Breast cancer Neg Hx    Colon polyps Neg Hx    Esophageal cancer Neg Hx    Rectal cancer Neg Hx    Stomach cancer Neg Hx    Allergies  Allergen Reactions   Claritin [Loratadine] Other (See Comments)    Felt jittery   Penicillins     REACTION: ? allergy   Pseudoephedrine     REACTION: makes her hyper   Current Outpatient Medications on File Prior to Visit  Medication Sig Dispense Refill   Cetirizine HCl 10 MG CAPS Take 1 capsule by mouth daily as needed.     polyethylene glycol (MIRALAX / GLYCOLAX) 17 g packet Take 17 g by mouth daily. 1/2 cap full once daily     Triamcinolone Acetonide (NASACORT ALLERGY 24HR NA) Place 2 sprays into the nose daily.     No current facility-administered medications on file prior to visit.     Review of Systems  Constitutional:  Negative for activity change, appetite change, fatigue, fever and unexpected weight change.  HENT:  Negative for congestion, ear pain, rhinorrhea, sinus pressure and sore throat.   Eyes:  Negative for pain, redness and visual disturbance.  Respiratory:  Negative for cough, shortness of breath and wheezing.   Cardiovascular:  Negative for chest pain and palpitations.  Gastrointestinal:  Negative for abdominal pain, blood in stool, constipation and  diarrhea.  Endocrine: Negative for polydipsia and polyuria.  Genitourinary:  Negative for dysuria, frequency and urgency.  Musculoskeletal:  Negative for arthralgias, back pain and myalgias.  Skin:  Negative for pallor and rash.  Allergic/Immunologic: Negative for environmental allergies.  Neurological:  Negative for dizziness, syncope and headaches.  Hematological:  Negative for adenopathy. Does not bruise/bleed easily.  Psychiatric/Behavioral:  Negative for decreased concentration and dysphoric mood. The patient is not nervous/anxious.       Objective:   Physical Exam Constitutional:      General: She is not in acute distress.    Appearance: Normal appearance. She is well-developed and normal weight. She is not ill-appearing or diaphoretic.  HENT:     Head: Normocephalic and atraumatic.     Right Ear: Tympanic membrane, ear canal and external ear normal.     Left Ear: Tympanic membrane, ear canal and external ear normal.     Nose: Nose normal. No congestion.     Mouth/Throat:     Mouth: Mucous membranes are moist.     Pharynx: Oropharynx is clear. No posterior oropharyngeal erythema.  Eyes:     General: No scleral icterus.    Extraocular Movements: Extraocular movements intact.     Conjunctiva/sclera: Conjunctivae normal.     Pupils: Pupils are equal, round, and reactive to light.  Neck:     Thyroid: No thyromegaly.     Vascular: No carotid bruit or JVD.  Cardiovascular:     Rate and Rhythm: Normal rate and regular rhythm.     Pulses: Normal pulses.  Heart sounds: Normal heart sounds.    No gallop.  Pulmonary:     Effort: Pulmonary effort is normal. No respiratory distress.     Breath sounds: Normal breath sounds. No wheezing.     Comments: Good air exch Chest:     Chest wall: No tenderness.  Abdominal:     General: Bowel sounds are normal. There is no distension or abdominal bruit.     Palpations: Abdomen is soft. There is no mass.     Tenderness: There is no  abdominal tenderness.     Hernia: No hernia is present.  Genitourinary:    Comments: Breast exam: No mass, nodules, thickening, tenderness, bulging, retraction, inflamation, nipple discharge or skin changes noted.  No axillary or clavicular LA.     Musculoskeletal:        General: No tenderness. Normal range of motion.     Cervical back: Normal range of motion and neck supple. No rigidity. No muscular tenderness.     Right lower leg: No edema.     Left lower leg: No edema.     Comments: No kyphosis   Lymphadenopathy:     Cervical: No cervical adenopathy.  Skin:    General: Skin is warm and dry.     Coloration: Skin is not pale.     Findings: No erythema or rash.     Comments: Solar lentigines diffusely   Neurological:     Mental Status: She is alert. Mental status is at baseline.     Cranial Nerves: No cranial nerve deficit.     Motor: No abnormal muscle tone.     Coordination: Coordination normal.     Gait: Gait normal.     Deep Tendon Reflexes: Reflexes are normal and symmetric. Reflexes normal.  Psychiatric:        Mood and Affect: Mood normal.        Cognition and Memory: Cognition and memory normal.          Assessment & Plan:   Problem List Items Addressed This Visit       Other   Medicare annual wellness visit, initial - Primary    Reviewed health habits including diet and exercise and skin cancer prevention Reviewed appropriate screening tests for age  Also reviewed health mt list, fam hx and immunization status , as well as social and family history   See HPI Labs reviewed  Colonoscopy utd Mammogram utd Flu shot given  Will return for pna vaccine when due  Interested in shingrix-plans to get at pharmacy  dexa up to date and no falls or fractures  Advance directive is utd No cognitive concerns Hearing test reviewed-she declines audiology referral yet  utd vision and eye exam phq score of 0 No help needed with ADLs and good functionality      Mild  hyperlipidemia    Disc goals for lipids and reasons to control them Rev last labs with pt Rev low sat fat diet in detail LDL up slt but trig are down  Diet is improving  Disc need to stop eating bacon daily      Prediabetes    Lab Results  Component Value Date   HGBA1C 5.6 07/23/2021  disc imp of low glycemic diet and wt loss to prevent DM2       Routine general medical examination at a health care facility    Reviewed health habits including diet and exercise and skin cancer prevention Reviewed appropriate screening tests for age  Also reviewed health mt list, fam hx and immunization status , as well as social and family history   See HPI Labs reviewed  Colonoscopy utd Mammogram utd Flu shot given  Will return for pna vaccine when due  Interested in shingrix-plans to get at pharmacy  dexa up to date and no falls or fractures  Advance directive is utd No cognitive concerns Hearing test reviewed-she declines audiology referral yet  utd vision and eye exam phq score of 0 No help needed with ADLs and good functionality      Other Visit Diagnoses     Need for influenza vaccination       Relevant Orders   Flu Vaccine QUAD High Dose(Fluad) (Completed)

## 2021-07-29 NOTE — Assessment & Plan Note (Signed)
Reviewed health habits including diet and exercise and skin cancer prevention ?Reviewed appropriate screening tests for age  ?Also reviewed health mt list, fam hx and immunization status , as well as social and family history   ?See HPI ?Labs reviewed  ?Colonoscopy utd ?Mammogram utd ?Flu shot given  ?Will return for pna vaccine when due  ?Interested in [place] to get at pharmacy  ?dexa up to date and no falls or fractures  ?Advance directive is utd ?No cognitive concerns ?Hearing test reviewed-she declines audiology referral yet  ?utd vision and eye exam ?phq score of 0 ?No help needed with ADLs and good functionality ?

## 2021-07-29 NOTE — Assessment & Plan Note (Signed)
Reviewed health habits including diet and exercise and skin cancer prevention ?Reviewed appropriate screening tests for age  ?Also reviewed health mt list, fam hx and immunization status , as well as social and family history   ?See HPI ?Labs reviewed  ?Colonoscopy utd ?Mammogram utd ?Flu shot given  ?Will return for pna vaccine when due  ?Interested in [place] to get at pharmacy  ?dexa up to date and no falls or fractures  ?Advance directive is utd ?No cognitive concerns ?Hearing test reviewed-she declines audiology referral yet  ?utd vision and eye exam ?phq score of 0 ?No help needed with ADLs and good functionality ?

## 2021-08-28 ENCOUNTER — Encounter: Payer: Self-pay | Admitting: Nurse Practitioner

## 2021-08-28 ENCOUNTER — Ambulatory Visit: Payer: Medicare PPO | Admitting: Nurse Practitioner

## 2021-08-28 VITALS — BP 136/84 | HR 85 | Temp 97.6°F | Resp 14 | Ht 63.0 in | Wt 161.2 lb

## 2021-08-28 DIAGNOSIS — R350 Frequency of micturition: Secondary | ICD-10-CM | POA: Insufficient documentation

## 2021-08-28 LAB — POCT URINALYSIS DIPSTICK
Bilirubin, UA: NEGATIVE
Blood, UA: NEGATIVE
Glucose, UA: NEGATIVE
Ketones, UA: NEGATIVE
Leukocytes, UA: NEGATIVE
Nitrite, UA: NEGATIVE
Protein, UA: NEGATIVE
Spec Grav, UA: 1.02 (ref 1.010–1.025)
Urobilinogen, UA: 0.2 E.U./dL
pH, UA: 6 (ref 5.0–8.0)

## 2021-08-28 NOTE — Progress Notes (Signed)
? ?Acute Office Visit ? ?Subjective:  ? ? Patient ID: Tracy Blankenship, female    DOB: 19-Sep-1955, 66 y.o.   MRN: [id number] ? ?Chief Complaint  ?Patient presents with  ? Urinary Frequency  ?  Sx started about 1 to 2 weeks ago-Some pressure in the pelvic area, and when holding the urine get a burning sensation in the inside. She does have a hx of interstitial cystitis.  ? ? ?Urinary Frequency  ?Associated symptoms include frequency. Pertinent negatives include no chills, flank pain, hematuria, nausea or vomiting.  ?Patient is in today for urinary frequency ? ?Symptoms started approx 1-2 weeks ago. States that it is not awful but noticeable. States that she does have a history of interstitial cystitis which involved injections. She has been in remission for the past approx 20 years and is no longer follow by urology  ? ?She is feeling. Pressure in the lower abdomen. Feels like she needs to go more often. She states that she has a history of interstitial cystitis. States that she was followed by urology Dr. Leory Plowman and then [facility] urological. ? ?Past Medical History:  ?Diagnosis Date  ? Allergy   ? Chronic sinusitis   ? Hyperglycemia   ? IC (interstitial cystitis)   ? Plantar fasciitis   ? Stress reaction   ? Trigger finger   ? ? ?Past Surgical History:  ?Procedure Laterality Date  ? APPENDECTOMY    ? CESAREAN SECTION    ? X 2  ? WISDOM TOOTH EXTRACTION    ? ? ?Family History  ?Problem Relation Age of Onset  ? Cardiomyopathy Father   ? Diabetes Father   ? Stroke Father   ? Anxiety disorder Mother   ? Lupus Mother   ? Emphysema Mother   ? Coronary artery disease Mother   ? Lung cancer Mother   ? Hypertension Mother   ? Heart disease Mother   ? COPD Mother   ? Kidney disease Mother   ? Colon cancer Neg Hx   ? Breast cancer Neg Hx   ? Colon polyps Neg Hx   ? Esophageal cancer Neg Hx   ? Rectal cancer Neg Hx   ? Stomach cancer Neg Hx   ? ? ?Social History  ? ?Socioeconomic History  ? Marital status: Married  ?  Spouse  name: Not on file  ? Number of children: 2  ? Years of education: Not on file  ? Highest education level: Not on file  ?Occupational History  ? Occupation: TEACHER  ?  Employer: [employer]  ?Tobacco Use  ? Smoking status: Never  ? Smokeless tobacco: Never  ?Vaping Use  ? Vaping Use: Never used  ?Substance and Sexual Activity  ? Alcohol use: Yes  ?  Alcohol/week: 0.0 standard drinks  ?  Comment: wine -every 3-4 months  ? Drug use: No  ? Sexual activity: Not on file  ?Other Topics Concern  ? Not on file  ?Social History Narrative  ? Not on file  ? ?Social Determinants of Health  ? ?Financial Resource Strain: Not on file  ?Food Insecurity: Not on file  ?Transportation Needs: Not on file  ?Physical Activity: Not on file  ?Stress: Not on file  ?Social Connections: Not on file  ?Intimate Partner Violence: Not on file  ? ? ?Outpatient Medications Prior to Visit  ?Medication Sig Dispense Refill  ? Calcium Carbonate-Vit D-Min (CALCIUM 1200) 1200-1000 MG-UNIT CHEW Chew by mouth.    ? Cetirizine HCl 10  MG CAPS Take 1 capsule by mouth daily as needed.    ? famotidine (PEPCID) 20 MG tablet Take 1 tablet (20 mg total) by mouth 2 (two) times daily as needed for heartburn or indigestion. 60 tablet 11  ? Multiple Vitamin (MULTIVITAMIN) tablet Take 1 tablet by mouth daily.    ? polyethylene glycol (MIRALAX / GLYCOLAX) 17 g packet Take 17 g by mouth daily. 1/2 cap full once daily    ? Triamcinolone Acetonide (NASACORT ALLERGY 24HR NA) Place 2 sprays into the nose daily.    ? ?No facility-administered medications prior to visit.  ? ? ?Allergies  ?Allergen Reactions  ? Claritin [Loratadine] Other (See Comments)  ?  Felt jittery  ? Penicillins   ?  REACTION: ? allergy  ? Pseudoephedrine   ?  REACTION: makes her hyper  ? ? ?Review of Systems  ?Constitutional:  Negative for chills and fever.  ?Gastrointestinal:  Positive for abdominal pain. Negative for diarrhea, nausea and vomiting.  ?     BM most the time daily.   ?Genitourinary:  Positive for frequency. Negative for flank pain, hematuria, vaginal bleeding, vaginal discharge and vaginal pain.  ?     Urine odor  ? ?   ?Objective:  ?  ?Physical Exam ?Vitals and nursing note reviewed.  ?Constitutional:   ?   Appearance: Normal appearance.  ?Cardiovascular:  ?   Rate and Rhythm: Normal rate and regular rhythm.  ?   Heart sounds: Normal heart sounds.  ?Pulmonary:  ?   Effort: Pulmonary effort is normal.  ?   Breath sounds: Normal breath sounds.  ?Abdominal:  ?   General: Bowel sounds are normal. There is no distension.  ?   Palpations: There is no mass.  ?   Tenderness: There is no abdominal tenderness. There is no right CVA tenderness or left CVA tenderness.  ?   Hernia: No hernia is present.  ?Skin: ?   General: Skin is warm.  ?Neurological:  ?   Mental Status: She is alert.  ? ? ?BP 136/84   Pulse 85   Temp 97.6 ?F (36.4 ?C)   Resp 14   Ht '5\' 3"'$  (1.6 m)   Wt 161 lb 4 oz (73.1 kg)   SpO2 96%   BMI 28.56 kg/m?  ?Wt Readings from Last 3 Encounters:  ?08/28/21 161 lb 4 oz (73.1 kg)  ?07/29/21 161 lb 2 oz (73.1 kg)  ?06/04/21 161 lb 6.4 oz (73.2 kg)  ? ? ?Health Maintenance Due  ?Topic Date Due  ? Hepatitis C Screening  Never done  ? Zoster Vaccines- Shingrix (1 of 2) Never done  ? COVID-19 Vaccine (4 - Booster for Pfizer series) 04/24/2020  ? Pneumonia Vaccine 50+ Years old (2 - PPSV23 if available, else PCV20) 08/07/2021  ? ? ?There are no preventive care reminders to display for this patient. ? ? ?Lab Results  ?Component Value Date  ? TSH 1.10 07/23/2021  ? ?Lab Results  ?Component Value Date  ? WBC 4.2 07/23/2021  ? HGB 14.0 07/23/2021  ? HCT 42.4 07/23/2021  ? MCV 87.1 07/23/2021  ? PLT 271.0 07/23/2021  ? ?Lab Results  ?Component Value Date  ? NA 140 07/23/2021  ? K 4.2 07/23/2021  ? CO2 30 07/23/2021  ? GLUCOSE 94 07/23/2021  ? BUN 19 07/23/2021  ? CREATININE 0.97 07/23/2021  ? BILITOT 0.5 07/23/2021  ? ALKPHOS 76 07/23/2021  ? AST 24 07/23/2021  ? ALT 19 07/23/2021   ?  PROT 7.0 07/23/2021  ? ALBUMIN 4.4 07/23/2021  ? CALCIUM 9.5 07/23/2021  ? ANIONGAP 10 11/06/2017  ? GFR 60.99 07/23/2021  ? ?Lab Results  ?Component Value Date  ? CHOL 216 (H) 07/23/2021  ? ?Lab Results  ?Component Value Date  ? HDL 56.70 07/23/2021  ? ?Lab Results  ?Component Value Date  ? LDLCALC 137 (H) 07/23/2021  ? ?Lab Results  ?Component Value Date  ? TRIG 110.0 07/23/2021  ? ?Lab Results  ?Component Value Date  ? CHOLHDL 4 07/23/2021  ? ?Lab Results  ?Component Value Date  ? HGBA1C 5.6 07/23/2021  ? ? ?   ?Assessment & Plan:  ? ?Problem List Items Addressed This Visit   ? ?  ? Other  ? Urinary frequency - Primary  ?  UA in office clear, pending culture. Avoid bladder irritants such as caffeine and soda. Increase water intake. If no improvement follow up. Hx of interstitial cystitis. Has not seen urology in an extended period of time due to being in remission  ?  ?  ? Relevant Orders  ? POCT urinalysis dipstick (Completed)  ? Urine Culture  ? ? ? ?No orders of the defined types were placed in this encounter. ? ? ? ?Romilda Garret, NP ? ?

## 2021-08-28 NOTE — Assessment & Plan Note (Signed)
UA in office clear, pending culture. Avoid bladder irritants such as caffeine and soda. Increase water intake. If no improvement follow up. Hx of interstitial cystitis. Has not seen urology in an extended period of time due to being in remission  ?

## 2021-08-28 NOTE — Patient Instructions (Signed)
Nice to see you today ?Urine test in office looked ok. Try increasing water intake and decreasing avoiding bladder irritants such as caffeine and soda.  ?I will be in touch in regards to the urine culture once I have the results. ?Follow up if no improvement  ?

## 2021-08-29 LAB — URINE CULTURE
MICRO NUMBER:: 13226044
Result:: NO GROWTH
SPECIMEN QUALITY:: ADEQUATE

## 2022-01-06 ENCOUNTER — Other Ambulatory Visit: Payer: Self-pay | Admitting: Family Medicine

## 2022-01-06 DIAGNOSIS — Z1231 Encounter for screening mammogram for malignant neoplasm of breast: Secondary | ICD-10-CM

## 2022-02-13 ENCOUNTER — Ambulatory Visit
Admission: RE | Admit: 2022-02-13 | Discharge: 2022-02-13 | Disposition: A | Discharge status: Home / Self Care | Payer: Medicare PPO | Source: Ambulatory Visit | Attending: Family Medicine | Admitting: Family Medicine

## 2022-02-13 DIAGNOSIS — Z1231 Encounter for screening mammogram for malignant neoplasm of breast: Secondary | ICD-10-CM | POA: Diagnosis not present

## 2022-02-26 ENCOUNTER — Encounter: Payer: Self-pay | Admitting: Family Medicine

## 2022-07-22 ENCOUNTER — Ambulatory Visit: Payer: Medicare PPO | Admitting: Family Medicine

## 2022-07-22 ENCOUNTER — Encounter: Payer: Self-pay | Admitting: Family Medicine

## 2022-07-22 ENCOUNTER — Ambulatory Visit (INDEPENDENT_AMBULATORY_CARE_PROVIDER_SITE_OTHER)
Admission: RE | Admit: 2022-07-22 | Discharge: 2022-07-22 | Disposition: A | Discharge status: Home / Self Care | Payer: Medicare PPO | Source: Ambulatory Visit | Attending: Family Medicine | Admitting: Family Medicine

## 2022-07-22 VITALS — BP 134/78 | HR 77 | Temp 97.7°F | Ht 63.0 in | Wt 164.4 lb

## 2022-07-22 DIAGNOSIS — Z860100 Personal history of colon polyps, unspecified: Secondary | ICD-10-CM

## 2022-07-22 DIAGNOSIS — R109 Unspecified abdominal pain: Secondary | ICD-10-CM | POA: Diagnosis not present

## 2022-07-22 DIAGNOSIS — R0789 Other chest pain: Secondary | ICD-10-CM

## 2022-07-22 DIAGNOSIS — R0781 Pleurodynia: Secondary | ICD-10-CM | POA: Diagnosis not present

## 2022-07-22 DIAGNOSIS — K5900 Constipation, unspecified: Secondary | ICD-10-CM | POA: Diagnosis not present

## 2022-07-22 LAB — CBC WITH DIFFERENTIAL/PLATELET
Basophils Absolute: 0 10*3/uL (ref 0.0–0.1)
Basophils Relative: 0.5 % (ref 0.0–3.0)
Eosinophils Absolute: 0.1 10*3/uL (ref 0.0–0.7)
Eosinophils Relative: 2.4 % (ref 0.0–5.0)
HCT: 45.3 % (ref 36.0–46.0)
Hemoglobin: 15.2 g/dL — ABNORMAL HIGH (ref 12.0–15.0)
Lymphocytes Relative: 35.9 % (ref 12.0–46.0)
Lymphs Abs: 1.8 10*3/uL (ref 0.7–4.0)
MCHC: 33.5 g/dL (ref 30.0–36.0)
MCV: 85.6 fl (ref 78.0–100.0)
Monocytes Absolute: 0.3 10*3/uL (ref 0.1–1.0)
Monocytes Relative: 5.6 % (ref 3.0–12.0)
Neutro Abs: 2.9 10*3/uL (ref 1.4–7.7)
Neutrophils Relative %: 55.6 % (ref 43.0–77.0)
Platelets: 304 10*3/uL (ref 150.0–400.0)
RBC: 5.3 Mil/uL — ABNORMAL HIGH (ref 3.87–5.11)
RDW: 13.4 % (ref 11.5–15.5)
WBC: 5.2 10*3/uL (ref 4.0–10.5)

## 2022-07-22 LAB — POC URINALSYSI DIPSTICK (AUTOMATED)
Bilirubin, UA: NEGATIVE
Blood, UA: NEGATIVE
Glucose, UA: NEGATIVE
Ketones, UA: NEGATIVE
Leukocytes, UA: NEGATIVE
Nitrite, UA: NEGATIVE
Protein, UA: NEGATIVE
Spec Grav, UA: 1.02 (ref 1.010–1.025)
Urobilinogen, UA: 0.2 E.U./dL
pH, UA: 6.5 (ref 5.0–8.0)

## 2022-07-22 LAB — COMPREHENSIVE METABOLIC PANEL
ALT: 20 U/L (ref 0–35)
AST: 23 U/L (ref 0–37)
Albumin: 4.3 g/dL (ref 3.5–5.2)
Alkaline Phosphatase: 91 U/L (ref 39–117)
BUN: 14 mg/dL (ref 6–23)
CO2: 31 mEq/L (ref 19–32)
Calcium: 10.8 mg/dL — ABNORMAL HIGH (ref 8.4–10.5)
Chloride: 101 mEq/L (ref 96–112)
Creatinine, Ser: 0.8 mg/dL (ref 0.40–1.20)
GFR: 76.32 mL/min (ref >60.00)
Glucose, Bld: 87 mg/dL (ref 70–99)
Potassium: 4.8 mEq/L (ref 3.5–5.1)
Sodium: 139 mEq/L (ref 135–145)
Total Bilirubin: 0.5 mg/dL (ref 0.2–1.2)
Total Protein: 7.4 g/dL (ref 6.0–8.3)

## 2022-07-22 LAB — LIPASE: Lipase: 27 U/L (ref 11.0–59.0)

## 2022-07-22 NOTE — Assessment & Plan Note (Signed)
Primarily LUQ where she has had it before  Historically this worsens with constipation and improves with use of miralax  No imp with H2 blocker or change in diet  Also some pain in L lower ribs   Is due for 5 y colonoscopy /polyp recall in April  Today  Ua -clear Labs -pending Abd xray- some stool present  Enc use of miralax prn  Ref to GI

## 2022-07-22 NOTE — Assessment & Plan Note (Signed)
Both ribs and abd under ribs Mild tenderness   Rib/cxr today noted no acute bony change  Some diffuse interstitial opacity- ? If she has had viral uri recently  No cough or sob

## 2022-07-22 NOTE — Assessment & Plan Note (Addendum)
Due in April for 5 y colonsocopy with Dr Hilarie Fredrickson  Ref done Pt will call for appt Also struggling with some constipation

## 2022-07-22 NOTE — Progress Notes (Signed)
Subjective:    Patient ID: Tracy Blankenship, female    DOB: 1955-12-24, 67 y.o.   MRN: [id number]  HPI Pt presents with abdominal pain for 3 days   Wt Readings from Last 3 Encounters:  07/22/22 164 lb 6 oz (74.6 kg)  08/28/21 161 lb 4 oz (73.1 kg)  07/29/21 161 lb 2 oz (73.1 kg)   29.12 kg/m  Vitals:   07/22/22 1212  BP: 134/78  Pulse: 77  Temp: 97.7 F (36.5 C)  SpO2: 96%     She has had constipation in the past - 2021  Abd film showed constipation  We increased her miralax and it helped   She stopped miralax a while ago   Pain is under her L ribs  Hurts to roll on that side   Started back on a very small dose of miralax on Friday   Bms can space out to every 2 days  Or they can be frequent (not watery)  Has had 3 bm this am - not watery (she does feel better)   Now is inc her miralax   Took probiotic and not imp   Has taken benefiber- ? If helped   No burp/ belching or heartburn  Once in a while takes pepcid if she eats wrong  No n/v No blood in stool   Ua clear today    Last colonoscopy 08/2017 with 5 y recall Had a few tics in sigmoid colon     Last abd film 04/2020 Narrative & Impression  CLINICAL DATA:  Abdominal pain   EXAM: ABDOMEN - 1 VIEW   COMPARISON:  None.   FINDINGS: There is moderate stool throughout the colon. There is no bowel dilatation or air-fluid level to suggest bowel obstruction. No free air. No abnormal calcifications beyond probable tiny phlebolith mid left pelvis. Lung bases clear.   IMPRESSION: Moderate stool in colon.  No bowel obstruction or free air.    Imaging today  DG Abd 1 View  Result Date: 07/22/2022 CLINICAL DATA:  Constipation EXAM: ABDOMEN - 1 VIEW COMPARISON:  05/08/2020 FINDINGS: Nonobstructive pattern of bowel gas. Scattered stool throughout the colon. No large burden. No free air on supine radiographs. Osseous structures unremarkable. IMPRESSION: Nonobstructive pattern of bowel gas. Scattered  stool throughout the colon. No large burden. Electronically Signed   By: Delanna Ahmadi M.D.   On: 07/22/2022 13:52   DG Ribs Unilateral W/Chest Left  Result Date: 07/22/2022 CLINICAL DATA:  Left lower anterior rib pain, no known injury EXAM: LEFT RIBS AND CHEST - 3+ VIEW COMPARISON:  CT chest, 06/07/2012 FINDINGS: No displaced fracture or other bone lesions are seen involving the ribs. There is no evidence of pneumothorax or pleural effusion. Diffuse bilateral interstitial opacity. Heart size and mediastinal contours are within normal limits. IMPRESSION: 1. No displaced fracture or other radiographic abnormality to explain rib pain. 2. Diffuse bilateral interstitial opacity, consistent with edema or atypical/viral infection. No focal airspace opacity. Electronically Signed   By: Delanna Ahmadi M.D.   On: 07/22/2022 13:49    Patient Active Problem List   Diagnosis Date Noted   History of colon polyps 07/22/2022   Urinary frequency 08/28/2021   Medicare annual wellness visit, initial 07/25/2020   Estrogen deficiency 07/25/2020   Vertigo 02/28/2019   Low back pain 06/17/2018   Eczema 02/09/2017   Mild hyperlipidemia 02/08/2016   H/O herpes zoster 01/31/2014   Vaginal dryness 10/29/2012   Left-sided chest wall pain 06/01/2012   Left  lateral abdominal pain 04/29/2011   Encounter for routine gynecological examination 04/22/2011   Routine general medical examination at a health care facility 03/30/2011   Urinary incontinence 01/07/2011   Allergic rhinitis 12/21/2009   INTERSTITIAL CYSTITIS 12/08/2006   Prediabetes 12/08/2006   Past Medical History:  Diagnosis Date   Allergy    Chronic sinusitis    Hyperglycemia    IC (interstitial cystitis)    Plantar fasciitis    Stress reaction    Trigger finger    Past Surgical History:  Procedure Laterality Date   APPENDECTOMY     CESAREAN SECTION     X 2   WISDOM TOOTH EXTRACTION     Social History   Tobacco Use   Smoking status: Never    Smokeless tobacco: Never  Vaping Use   Vaping Use: Never used  Substance Use Topics   Alcohol use: Yes    Alcohol/week: 0.0 standard drinks of alcohol    Comment: wine -every 3-4 months   Drug use: No   Family History  Problem Relation Age of Onset   Cardiomyopathy Father    Diabetes Father    Stroke Father    Anxiety disorder Mother    Lupus Mother    Emphysema Mother    Coronary artery disease Mother    Lung cancer Mother    Hypertension Mother    Heart disease Mother    COPD Mother    Kidney disease Mother    Colon cancer Neg Hx    Breast cancer Neg Hx    Colon polyps Neg Hx    Esophageal cancer Neg Hx    Rectal cancer Neg Hx    Stomach cancer Neg Hx    Allergies  Allergen Reactions   Claritin [Loratadine] Other (See Comments)    Felt jittery   Penicillins     REACTION: ? allergy   Pseudoephedrine     REACTION: makes her hyper   Current Outpatient Medications on File Prior to Visit  Medication Sig Dispense Refill   Calcium Carbonate-Vit D-Min (CALCIUM 1200) 1200-1000 MG-UNIT CHEW Chew by mouth.     Cetirizine HCl 10 MG CAPS Take 1 capsule by mouth daily as needed.     famotidine (PEPCID) 20 MG tablet Take 1 tablet (20 mg total) by mouth 2 (two) times daily as needed for heartburn or indigestion. 60 tablet 11   Multiple Vitamin (MULTIVITAMIN) tablet Take 1 tablet by mouth daily.     polyethylene glycol powder (MIRALAX) 17 GM/SCOOP powder Take 1 Container by mouth daily as needed.     Triamcinolone Acetonide (NASACORT ALLERGY 24HR NA) Place 2 sprays into the nose daily.     No current facility-administered medications on file prior to visit.    Review of Systems  Constitutional:  Negative for activity change, appetite change, fatigue, fever and unexpected weight change.  HENT:  Negative for congestion, ear pain, rhinorrhea, sinus pressure and sore throat.   Eyes:  Negative for pain, redness and visual disturbance.  Respiratory:  Negative for cough, shortness of  breath and wheezing.        Chest wall pain on L   Cardiovascular:  Negative for chest pain and palpitations.  Gastrointestinal:  Positive for abdominal pain and constipation. Negative for abdominal distention, anal bleeding, blood in stool, diarrhea, nausea, rectal pain and vomiting.  Endocrine: Negative for polydipsia and polyuria.  Genitourinary:  Negative for dysuria, frequency and urgency.  Musculoskeletal:  Negative for arthralgias, back pain and myalgias.  Skin:  Negative for pallor and rash.  Allergic/Immunologic: Negative for environmental allergies.  Neurological:  Negative for dizziness, syncope and headaches.  Hematological:  Negative for adenopathy. Does not bruise/bleed easily.  Psychiatric/Behavioral:  Negative for decreased concentration and dysphoric mood. The patient is not nervous/anxious.        Objective:   Physical Exam Constitutional:      General: She is not in acute distress.    Appearance: She is well-developed.     Comments: Overweight   HENT:     Head: Normocephalic and atraumatic.     Mouth/Throat:     Mouth: Mucous membranes are moist.  Eyes:     Conjunctiva/sclera: Conjunctivae normal.     Pupils: Pupils are equal, round, and reactive to light.  Neck:     Thyroid: No thyromegaly.     Vascular: No carotid bruit or JVD.  Cardiovascular:     Rate and Rhythm: Normal rate and regular rhythm.     Heart sounds: Normal heart sounds.     No gallop.  Pulmonary:     Effort: Pulmonary effort is normal. No respiratory distress.     Breath sounds: Normal breath sounds. No wheezing or rales.     Comments: Some L lower rib tenderness No crepitus or step off Chest:     Chest wall: Tenderness present.  Abdominal:     General: Abdomen is protuberant. Bowel sounds are normal. There is no distension or abdominal bruit.     Palpations: Abdomen is soft. There is no shifting dullness, fluid wave, hepatomegaly, splenomegaly, mass or pulsatile mass.     Tenderness:  There is abdominal tenderness in the left upper quadrant. There is no right CVA tenderness, left CVA tenderness, guarding or rebound. Negative signs include Murphy's sign and McBurney's sign.     Hernia: No hernia is present.  Musculoskeletal:     Cervical back: Normal range of motion and neck supple.     Right lower leg: No edema.     Left lower leg: No edema.  Lymphadenopathy:     Cervical: No cervical adenopathy.  Skin:    General: Skin is warm and dry.     Coloration: Skin is not pale.     Findings: No rash.  Neurological:     Mental Status: She is alert.     Coordination: Coordination normal.     Deep Tendon Reflexes: Reflexes are normal and symmetric. Reflexes normal.  Psychiatric:        Mood and Affect: Mood normal.           Assessment & Plan:   Problem List Items Addressed This Visit       Other   History of colon polyps    Due in April for 5 y colonsocopy with Dr Hilarie Fredrickson  Ref done Pt will call for appt Also struggling with some constipation       Relevant Orders   Ambulatory referral to Gastroenterology   Left lateral abdominal pain - Primary    Primarily LUQ where she has had it before  Historically this worsens with constipation and improves with use of miralax  No imp with H2 blocker or change in diet  Also some pain in L lower ribs   Is due for 5 y colonoscopy /polyp recall in April  Today  Ua -clear Labs -pending Abd xray- some stool present  Enc use of miralax prn  Ref to GI        Relevant Orders   DG  Ribs Unilateral W/Chest Left (Completed)   DG Abd 1 View (Completed)   CBC with Differential/Platelet (Completed)   Comprehensive metabolic panel (Completed)   Lipase (Completed)   POCT Urinalysis Dipstick (Automated) (Completed)   Ambulatory referral to Gastroenterology   Left-sided chest wall pain    Both ribs and abd under ribs Mild tenderness   Rib/cxr today noted no acute bony change  Some diffuse interstitial opacity- ? If she  has had viral uri recently  No cough or sob       Relevant Orders   DG Ribs Unilateral W/Chest Left (Completed)   DG Abd 1 View (Completed)

## 2022-07-22 NOTE — Patient Instructions (Signed)
Drink more fluids   Get fiber in diet  Prunes or prune juice is helpful   Continue miralax as needed   Xray of chest/ribs and abdomen today  Labs today  Urinalysis today   I will put in referral for colonoscopy  Call [facility] to schedule that    [facility]  [phone]

## 2022-07-23 ENCOUNTER — Encounter: Payer: Self-pay | Admitting: Family Medicine

## 2022-07-23 ENCOUNTER — Telehealth: Payer: Self-pay | Admitting: Internal Medicine

## 2022-07-23 NOTE — Telephone Encounter (Signed)
Good Morning Dr. Hilarie Fredrickson,   Patient called stating that her PCP Dr. Glori Bickers had sent a referral over to our practice for her to have a repeat colonoscopy as well as Left lateral abdominal pain. Patient stated she knew she was due for her colonoscopy in April of 2024 but Dr. Glori Bickers is wanting her to have the procedure done before April. I placed patient down for a office visit with Ellouise Newer on 4/3 at 10:00 due to her having the abdominal pain, but patient requested that I send a message to you to see if she can just have the procedure and not have office visit. Will you please review and advise on scheduling patient?   Thank you.

## 2022-07-23 NOTE — Telephone Encounter (Signed)
[facility] for surveillance colon appt before April if available Hx of polyps is indication  I see Dr. Glori Bickers is more suspicious for chest wall pain than abd pain

## 2022-07-24 ENCOUNTER — Encounter: Payer: Self-pay | Admitting: Internal Medicine

## 2022-07-24 NOTE — Telephone Encounter (Signed)
Patient was scheduled for 4/5 3:00 for colonoscopy

## 2022-07-25 MED ORDER — AZITHROMYCIN 250 MG PO TABS: ORAL_TABLET | ORAL | 0 refills | Status: DC

## 2022-07-25 NOTE — Telephone Encounter (Signed)
I sent zithromax to her pharmacy to cover her for any atypical bacterial pneumonia  Since she is getting better this may be more likely a virus  We will re eval at her follow up and discuss immunization status  Thanks for the heads up

## 2022-07-25 NOTE — Telephone Encounter (Signed)
Pt called in requesting a call back stated she has question and concerns about issues with her lungs [phone]

## 2022-07-25 NOTE — Telephone Encounter (Signed)
Called pt back and she said she is "freaking out" given xray results of her chest. Pt said she wants to make sure there is no connection between her illness and her granddaughter's. Pt's granddaughter had cough and congestion and went to her PCP and they did RSV, flu and covid testing and they were all negative. Pt said granddaughter went back recently because she was still sick and they heard wheezing and diagnoses baby with pneumonia and pt was around her. Pt also looked on mychart and it only shows she got one prevnar 13 vaccine and didn't get anymore. Pt wanted to know if she needs anymore pneumonia vaccines. I did ask pt how she is feeling and she said "not a whole lot better" but said but it may be because she is so worried about her xray. Pt scheduled a f/u with PCP on Monday for a recheck because she said she is still having the chest pain. Pt also has colonoscopy scheduled with GI on 08/29/22. Pt is just worried and wanted to know if she needs to do anything before her appt on Monday or if her sxs are related to granddaughter with pneumonia

## 2022-07-25 NOTE — Telephone Encounter (Signed)
Pt notified of Dr. Marliss Coots comments and verbalized understanding. She will get abx now.

## 2022-07-28 ENCOUNTER — Ambulatory Visit: Payer: Medicare PPO | Admitting: Family Medicine

## 2022-07-28 ENCOUNTER — Telehealth: Payer: Self-pay | Admitting: Family Medicine

## 2022-07-28 ENCOUNTER — Encounter: Payer: Self-pay | Admitting: Family Medicine

## 2022-07-28 VITALS — BP 145/84 | HR 93 | Temp 98.0°F | Ht 63.0 in | Wt 163.5 lb

## 2022-07-28 DIAGNOSIS — R0789 Other chest pain: Secondary | ICD-10-CM | POA: Diagnosis not present

## 2022-07-28 DIAGNOSIS — E785 Hyperlipidemia, unspecified: Secondary | ICD-10-CM

## 2022-07-28 DIAGNOSIS — R030 Elevated blood-pressure reading, without diagnosis of hypertension: Secondary | ICD-10-CM | POA: Diagnosis not present

## 2022-07-28 DIAGNOSIS — R109 Unspecified abdominal pain: Secondary | ICD-10-CM | POA: Diagnosis not present

## 2022-07-28 DIAGNOSIS — R7303 Prediabetes: Secondary | ICD-10-CM

## 2022-07-28 NOTE — Telephone Encounter (Signed)
-----   Message from Velna Hatchet, RT sent at 07/15/2022 10:23 AM EST ----- Regarding: Tue 3/5 lab Patient is scheduled for cpx, please order future labs.  Thanks, Anda Kraft

## 2022-07-28 NOTE — Patient Instructions (Addendum)
Finish the zpack  Drink lots of water   Continue to hold the calcium supplement   Lab tomorrow as planned   We want a chest xray at the end of the month - we will see if we can do that here or somewhere else   Take care of yourself   If you chest or abdominal symptoms change or worsen in the meantime let us know

## 2022-07-28 NOTE — Assessment & Plan Note (Signed)
Improved so far with better water intake and relief of constipation  Will continue to monitor  Colonoscopy planned for April

## 2022-07-28 NOTE — Assessment & Plan Note (Signed)
Suspect due to anxiety today BP: (!) 145/84  2nd check   Plans f/u 1 wk for her cpx and will re check then

## 2022-07-28 NOTE — Assessment & Plan Note (Signed)
This is improved but she has some tightness and burning sens in chest Cxr noted interstitial opacity - poss atypical bact or viral pna  Treating with zpack  Will closely monitor symptoms  No s/s of CHF Re check exam at visit next wk Then plan cxr at end of mo (earlier if sympt worsen)

## 2022-07-28 NOTE — Progress Notes (Signed)
Subjective:    Patient ID: Tracy Blankenship, female    DOB: June 17, 1955, 67 y.o.   MRN: [id number]  HPI Pt presents for f/u of chronic medical problems   Wt Readings from Last 3 Encounters:  07/28/22 163 lb 8 oz (74.2 kg)  07/22/22 164 lb 6 oz (74.6 kg)  08/28/21 161 lb 4 oz (73.1 kg)   28.96 kg/m  . Vitals:   07/28/22 1156  BP: (!) 166/84  Pulse: 93  Temp: 98 F (36.7 C)  SpO2: 96%   Imp bp 2nd check BP: (!) 145/84    Seen last time on 2/27 for Left sided abd and rib pain  Ua was clear   Abd US showed stool throughout colon Cxr noted diffuse intersticial opacity (? Atypical infection or virus or edema)  Nothing focal  We sent in zithromax She had been exp to a sick child prior   Noted due for colonoscopy in April  (is scheduled on 4/5)  She cancelled appt with PA since symptoms did improve with inc water  DG Abd 1 View  Result Date: 07/22/2022 CLINICAL DATA:  Constipation EXAM: ABDOMEN - 1 VIEW COMPARISON:  05/08/2020 FINDINGS: Nonobstructive pattern of bowel gas. Scattered stool throughout the colon. No large burden. No free air on supine radiographs. Osseous structures unremarkable. IMPRESSION: Nonobstructive pattern of bowel gas. Scattered stool throughout the colon. No large burden. Electronically Signed   By: Delanna Ahmadi M.D.   On: 07/22/2022 13:52   DG Ribs Unilateral W/Chest Left  Result Date: 07/22/2022 CLINICAL DATA:  Left lower anterior rib pain, no known injury EXAM: LEFT RIBS AND CHEST - 3+ VIEW COMPARISON:  CT chest, 06/07/2012 FINDINGS: No displaced fracture or other bone lesions are seen involving the ribs. There is no evidence of pneumothorax or pleural effusion. Diffuse bilateral interstitial opacity. Heart size and mediastinal contours are within normal limits. IMPRESSION: 1. No displaced fracture or other radiographic abnormality to explain rib pain. 2. Diffuse bilateral interstitial opacity, consistent with edema or atypical/viral infection. No  focal airspace opacity. Electronically Signed   By: Delanna Ahmadi M.D.   On: 07/22/2022 13:49    Family hot sick Baby and son in law  Baby may have had pneumonia   By Friday she has more pain in ribs/ some in the R side also  In back  A burning sensation also  A little more tired   Did covid test- negative  Has felt tight and burning  Once felt sob talking   Not coughing currently  No fever   No orthopnea or pnd  No ankle swelling   She wears a mask if around any chemicals       Lab Results  Component Value Date   CREATININE 0.80 07/22/2022   BUN 14 07/22/2022   NA 139 07/22/2022   K 4.8 07/22/2022   CL 101 07/22/2022   CO2 31 07/22/2022   Lab Results  Component Value Date   ALT 20 07/22/2022   AST 23 07/22/2022   ALKPHOS 91 07/22/2022   BILITOT 0.5 07/22/2022   Ca of 10.8  (she does take ca)  Lab Results  Component Value Date   WBC 5.2 07/22/2022   HGB 15.2 (H) 07/22/2022   HCT 45.3 07/22/2022   MCV 85.6 07/22/2022   PLT 304.0 07/22/2022    Had prevnar 13 vaccine in 2022 Will be due for prevnar 20    Patient Active Problem List   Diagnosis Date Noted  Elevated blood pressure reading 07/28/2022   History of colon polyps 07/22/2022   Urinary frequency 08/28/2021   Medicare annual wellness visit, initial 07/25/2020   Estrogen deficiency 07/25/2020   Vertigo 02/28/2019   Low back pain 06/17/2018   Eczema 02/09/2017   Mild hyperlipidemia 02/08/2016   H/O herpes zoster 01/31/2014   Vaginal dryness 10/29/2012   Left-sided chest wall pain 06/01/2012   Left lateral abdominal pain 04/29/2011   Encounter for routine gynecological examination 04/22/2011   Routine general medical examination at a health care facility 03/30/2011   Urinary incontinence 01/07/2011   Allergic rhinitis 12/21/2009   INTERSTITIAL CYSTITIS 12/08/2006   Prediabetes 12/08/2006   Past Medical History:  Diagnosis Date   Allergy    Chronic sinusitis    Hyperglycemia    IC  (interstitial cystitis)    Plantar fasciitis    Stress reaction    Trigger finger    Past Surgical History:  Procedure Laterality Date   APPENDECTOMY     CESAREAN SECTION     X 2   WISDOM TOOTH EXTRACTION     Social History   Tobacco Use   Smoking status: Never   Smokeless tobacco: Never  Vaping Use   Vaping Use: Never used  Substance Use Topics   Alcohol use: Yes    Alcohol/week: 0.0 standard drinks of alcohol    Comment: wine -every 3-4 months   Drug use: No   Family History  Problem Relation Age of Onset   Cardiomyopathy Father    Diabetes Father    Stroke Father    Anxiety disorder Mother    Lupus Mother    Emphysema Mother    Coronary artery disease Mother    Lung cancer Mother    Hypertension Mother    Heart disease Mother    COPD Mother    Kidney disease Mother    Colon cancer Neg Hx    Breast cancer Neg Hx    Colon polyps Neg Hx    Esophageal cancer Neg Hx    Rectal cancer Neg Hx    Stomach cancer Neg Hx    Allergies  Allergen Reactions   Claritin [Loratadine] Other (See Comments)    Felt jittery   Penicillins     REACTION: ? allergy   Pseudoephedrine     REACTION: makes her hyper   Current Outpatient Medications on File Prior to Visit  Medication Sig Dispense Refill   azithromycin (ZITHROMAX Z-PAK) 250 MG tablet Take 2 pills by mouth today and then 1 pill daily for 4 days 6 tablet 0   Calcium Carbonate-Vit D-Min (CALCIUM 1200) 1200-1000 MG-UNIT CHEW Chew by mouth.     Cetirizine HCl 10 MG CAPS Take 1 capsule by mouth daily as needed.     famotidine (PEPCID) 20 MG tablet Take 1 tablet (20 mg total) by mouth 2 (two) times daily as needed for heartburn or indigestion. 60 tablet 11   Multiple Vitamin (MULTIVITAMIN) tablet Take 1 tablet by mouth daily.     polyethylene glycol powder (MIRALAX) 17 GM/SCOOP powder Take 1 Container by mouth daily as needed.     Triamcinolone Acetonide (NASACORT ALLERGY 24HR NA) Place 2 sprays into the nose daily.      No current facility-administered medications on file prior to visit.    Review of Systems  Constitutional:  Negative for activity change, appetite change, fatigue, fever and unexpected weight change.  HENT:  Negative for congestion, ear pain, rhinorrhea, sinus pressure and sore throat.  Eyes:  Negative for pain, redness and visual disturbance.  Respiratory:  Positive for chest tightness. Negative for cough, choking, shortness of breath and wheezing.        Some tight and burning sensation in chest  Not coughing  No wheeze   Cardiovascular:  Negative for palpitations and leg swelling.  Gastrointestinal:  Positive for abdominal pain and constipation. Negative for blood in stool and diarrhea.       Improved   Endocrine: Negative for polydipsia and polyuria.  Genitourinary:  Negative for dysuria, frequency and urgency.  Musculoskeletal:  Negative for arthralgias, back pain and myalgias.  Skin:  Negative for pallor and rash.  Allergic/Immunologic: Negative for environmental allergies.  Neurological:  Negative for dizziness, syncope and headaches.  Hematological:  Negative for adenopathy. Does not bruise/bleed easily.  Psychiatric/Behavioral:  Negative for decreased concentration and dysphoric mood. The patient is not nervous/anxious.        Objective:   Physical Exam Constitutional:      General: She is not in acute distress.    Appearance: Normal appearance. She is well-developed and normal weight. She is not ill-appearing or diaphoretic.  HENT:     Head: Normocephalic and atraumatic.  Eyes:     General: No scleral icterus.       Right eye: No discharge.        Left eye: No discharge.     Conjunctiva/sclera: Conjunctivae normal.     Pupils: Pupils are equal, round, and reactive to light.  Neck:     Thyroid: No thyromegaly.     Vascular: No carotid bruit or JVD.  Cardiovascular:     Rate and Rhythm: Regular rhythm. Tachycardia present.     Heart sounds: Normal heart sounds.      No gallop.  Pulmonary:     Effort: Pulmonary effort is normal. No respiratory distress.     Breath sounds: Normal breath sounds. No stridor. No wheezing, rhonchi or rales.     Comments: Bs are harsh  No wheeze/rales/rhinchi or crackles  Good air exch  Mild chest wall tenderness lower left  Improved from last time Chest:     Chest wall: Tenderness present.  Abdominal:     General: Abdomen is flat. Bowel sounds are normal. There is no distension or abdominal bruit.     Palpations: Abdomen is soft.     Tenderness: There is abdominal tenderness in the left upper quadrant. There is no right CVA tenderness, left CVA tenderness, guarding or rebound. Negative signs include Murphy's sign and McBurney's sign.     Comments: Abd tenderness in LUQ is improved    Musculoskeletal:     Cervical back: Normal range of motion and neck supple.     Right lower leg: No edema.     Left lower leg: No edema.  Lymphadenopathy:     Cervical: No cervical adenopathy.  Skin:    General: Skin is warm and dry.     Coloration: Skin is not jaundiced or pale.     Findings: No rash.  Neurological:     Mental Status: She is alert.     Cranial Nerves: No cranial nerve deficit.     Motor: No weakness.     Coordination: Coordination normal.     Deep Tendon Reflexes: Reflexes are normal and symmetric. Reflexes normal.  Psychiatric:        Mood and Affect: Mood normal.           Assessment & Plan:   Problem  List Items Addressed This Visit       Other   Elevated blood pressure reading    Suspect due to anxiety today BP: (!) 145/84  2nd check   Plans f/u 1 wk for her cpx and will re check then      Left lateral abdominal pain - Primary    Improved so far with better water intake and relief of constipation  Will continue to monitor  Colonoscopy planned for April       Left-sided chest wall pain    This is improved but she has some tightness and burning sens in chest Cxr noted interstitial  opacity - poss atypical bact or viral pna  Treating with zpack  Will closely monitor symptoms  No s/s of CHF Re check exam at visit next wk Then plan cxr at end of mo (earlier if sympt worsen)

## 2022-07-29 ENCOUNTER — Other Ambulatory Visit (INDEPENDENT_AMBULATORY_CARE_PROVIDER_SITE_OTHER): Payer: Medicare PPO

## 2022-07-29 DIAGNOSIS — E785 Hyperlipidemia, unspecified: Secondary | ICD-10-CM

## 2022-07-29 DIAGNOSIS — R7303 Prediabetes: Secondary | ICD-10-CM | POA: Diagnosis not present

## 2022-07-29 DIAGNOSIS — R030 Elevated blood-pressure reading, without diagnosis of hypertension: Secondary | ICD-10-CM | POA: Diagnosis not present

## 2022-07-29 LAB — CBC WITH DIFFERENTIAL/PLATELET
Basophils Absolute: 0 10*3/uL (ref 0.0–0.1)
Basophils Relative: 0.5 % (ref 0.0–3.0)
Eosinophils Absolute: 0.1 10*3/uL (ref 0.0–0.7)
Eosinophils Relative: 2.4 % (ref 0.0–5.0)
HCT: 44.4 % (ref 36.0–46.0)
Hemoglobin: 14.9 g/dL (ref 12.0–15.0)
Lymphocytes Relative: 36.1 % (ref 12.0–46.0)
Lymphs Abs: 1.7 10*3/uL (ref 0.7–4.0)
MCHC: 33.6 g/dL (ref 30.0–36.0)
MCV: 86 fl (ref 78.0–100.0)
Monocytes Absolute: 0.3 10*3/uL (ref 0.1–1.0)
Monocytes Relative: 6.1 % (ref 3.0–12.0)
Neutro Abs: 2.6 10*3/uL (ref 1.4–7.7)
Neutrophils Relative %: 54.9 % (ref 43.0–77.0)
Platelets: 298 10*3/uL (ref 150.0–400.0)
RBC: 5.16 Mil/uL — ABNORMAL HIGH (ref 3.87–5.11)
RDW: 13.6 % (ref 11.5–15.5)
WBC: 4.8 10*3/uL (ref 4.0–10.5)

## 2022-07-29 LAB — LIPID PANEL
Cholesterol: 206 mg/dL — ABNORMAL HIGH (ref 0–200)
HDL: 57.3 mg/dL (ref >39.00)
LDL Cholesterol: 121 mg/dL — ABNORMAL HIGH (ref 0–99)
NonHDL: 148.99
Total CHOL/HDL Ratio: 4
Triglycerides: 141 mg/dL (ref 0.0–149.0)
VLDL: 28.2 mg/dL (ref 0.0–40.0)

## 2022-07-29 LAB — COMPREHENSIVE METABOLIC PANEL
ALT: 12 U/L (ref 0–35)
AST: 16 U/L (ref 0–37)
Albumin: 4.2 g/dL (ref 3.5–5.2)
Alkaline Phosphatase: 80 U/L (ref 39–117)
BUN: 16 mg/dL (ref 6–23)
CO2: 29 mEq/L (ref 19–32)
Calcium: 9.8 mg/dL (ref 8.4–10.5)
Chloride: 101 mEq/L (ref 96–112)
Creatinine, Ser: 0.85 mg/dL (ref 0.40–1.20)
GFR: 70.96 mL/min (ref >60.00)
Glucose, Bld: 85 mg/dL (ref 70–99)
Potassium: 4.3 mEq/L (ref 3.5–5.1)
Sodium: 140 mEq/L (ref 135–145)
Total Bilirubin: 0.5 mg/dL (ref 0.2–1.2)
Total Protein: 7 g/dL (ref 6.0–8.3)

## 2022-07-29 LAB — HEMOGLOBIN A1C: Hgb A1c MFr Bld: 5.6 % (ref 4.6–6.5)

## 2022-07-29 LAB — TSH: TSH: 0.81 u[IU]/mL (ref 0.35–5.50)

## 2022-07-30 ENCOUNTER — Ambulatory Visit: Payer: Medicare PPO | Admitting: Family Medicine

## 2022-07-31 ENCOUNTER — Ambulatory Visit (INDEPENDENT_AMBULATORY_CARE_PROVIDER_SITE_OTHER): Payer: Medicare PPO

## 2022-07-31 VITALS — Ht 63.0 in | Wt 163.0 lb

## 2022-07-31 DIAGNOSIS — Z0000 Encounter for general adult medical examination without abnormal findings: Secondary | ICD-10-CM

## 2022-07-31 NOTE — Progress Notes (Signed)
I connected with  Tracy Blankenship on 07/31/22 by a audio enabled telemedicine application and verified that I am speaking with the correct person using two identifiers.  Patient Location: Home  Provider Location: Home Office  I discussed the limitations of evaluation and management by telemedicine. The patient expressed understanding and agreed to proceed.  Subjective:   Tracy Blankenship is a 67 y.o. female who presents for Medicare Annual (Subsequent) preventive examination.  Review of Systems      Cardiac Risk Factors include: advanced age (>77mn, >>43women);sedentary lifestyle     Objective:    Today's Vitals   07/31/22 1139  Weight: 163 lb (73.9 kg)  Height: '5\' 3"'$  (1.6 m)   Body mass index is 28.87 kg/m.     07/31/2022   11:49 AM 11/06/2017    5:38 PM 09/09/2017    9:56 AM 08/18/2017    9:42 AM  Advanced Directives  Does Patient Have a Medical Advance Directive? Yes Yes Yes Yes  Type of [place] Living will [place]  Copy of [place] Chart? No - copy requested       Current Medications (verified) Outpatient Encounter Medications as of 07/31/2022  Medication Sig   Calcium Carbonate-Vit D-Min (CALCIUM 1200) 1200-1000 MG-UNIT CHEW Chew by mouth.   Multiple Vitamin (MULTIVITAMIN) tablet Take 1 tablet by mouth daily.   polyethylene glycol powder (MIRALAX) 17 GM/SCOOP powder Take 1 Container by mouth daily as needed.   Triamcinolone Acetonide (NASACORT ALLERGY 24HR NA) Place 2 sprays into the nose daily.   azithromycin (ZITHROMAX Z-PAK) 250 MG tablet Take 2 pills by mouth today and then 1 pill daily for 4 days (Patient not taking: Reported on 07/31/2022)   Cetirizine HCl 10 MG CAPS Take 1 capsule by mouth daily as needed.   famotidine (PEPCID) 20 MG tablet Take 1 tablet (20 mg total) by mouth 2 (two) times daily as needed for heartburn or  indigestion.   No facility-administered encounter medications on file as of 07/31/2022.    Allergies (verified) Claritin [loratadine], Penicillins, and Pseudoephedrine   History: Past Medical History:  Diagnosis Date   Allergy    Chronic sinusitis    Hyperglycemia    IC (interstitial cystitis)    Plantar fasciitis    Stress reaction    Trigger finger    Past Surgical History:  Procedure Laterality Date   APPENDECTOMY     CESAREAN SECTION     X 2   WISDOM TOOTH EXTRACTION     Family History  Problem Relation Age of Onset   Cardiomyopathy Father    Diabetes Father    Stroke Father    Anxiety disorder Mother    Lupus Mother    Emphysema Mother    Coronary artery disease Mother    Lung cancer Mother    Hypertension Mother    Heart disease Mother    COPD Mother    Kidney disease Mother    Colon cancer Neg Hx    Breast cancer Neg Hx    Colon polyps Neg Hx    Esophageal cancer Neg Hx    Rectal cancer Neg Hx    Stomach cancer Neg Hx    Social History   Socioeconomic History   Marital status: Married    Spouse name: Not on file   Number of children: 2   Years of education: Not on file  Highest education level: Not on file  Occupational History   Occupation: TEACHER    Employer: [employer]  Tobacco Use   Smoking status: Never   Smokeless tobacco: Never  Vaping Use   Vaping Use: Never used  Substance and Sexual Activity   Alcohol use: Yes    Alcohol/week: 0.0 standard drinks of alcohol    Comment: wine -every 3-4 months   Drug use: No   Sexual activity: Not on file  Other Topics Concern   Not on file  Social History Narrative   Not on file   Social Determinants of Health   Financial Resource Strain: Low Risk  (07/31/2022)   Overall Financial Resource Strain (CARDIA)    Difficulty of Paying Living Expenses: Not hard at all  Food Insecurity: No Food Insecurity (07/31/2022)   Hunger Vital Sign    Worried About Running Out of Food in the Last  Year: Never true    Ran Out of Food in the Last Year: Never true  Transportation Needs: No Transportation Needs (07/31/2022)   PRAPARE - Hydrologist (Medical): No    Lack of Transportation (Non-Medical): No  Physical Activity: Inactive (07/31/2022)   Exercise Vital Sign    Days of Exercise per Week: 0 days    Minutes of Exercise per Session: 0 min  Stress: No Stress Concern Present (07/31/2022)   [place]    Feeling of Stress : Not at all  Social Connections: Moderately Isolated (07/31/2022)   Social Connection and Isolation Panel [NHANES]    Frequency of Communication with Friends and Family: More than three times a week    Frequency of Social Gatherings with Friends and Family: More than three times a week    Attends Religious Services: Never    Marine scientist or Organizations: No    Attends Music therapist: Never    Marital Status: Married    Tobacco Counseling Counseling given: Not Answered   Clinical Intake:  Pre-visit preparation completed: Yes  Pain : No/denies pain     Nutritional Risks: None Diabetes: No  How often do you need to have someone help you when you read instructions, pamphlets, or other written materials from your doctor or pharmacy?: 1 - Never  Diabetic? no  Interpreter Needed?: No  Information entered by :: C.Morris Markham LPN   Activities of Daily Living    07/31/2022   11:50 AM  In your present state of health, do you have any difficulty performing the following activities:  Hearing? 1  Comment at times  Vision? 0  Difficulty concentrating or making decisions? 0  Walking or climbing stairs? 0  Dressing or bathing? 0  Doing errands, shopping? 0  Preparing Food and eating ? N  Using the Toilet? N  In the past six months, have you accidently leaked urine? N  Do you have problems with loss of bowel control? N  Managing your  Medications? N  Managing your Finances? N  Housekeeping or managing your Housekeeping? N    Patient Care Team: Tower, Wynelle Fanny, MD as PCP - General  Indicate any recent Medical Services you may have received from other than [facility] providers in the past year (date may be approximate).     Assessment:   This is a routine wellness examination for Tracy Blankenship.  Hearing/Vision screen Hearing Screening - Comments:: No aid Vision Screening - Comments:: Glasses - [facility]  Dietary  issues and exercise activities discussed: Current Exercise Habits: The patient does not participate in regular exercise at present, Exercise limited by: None identified   Goals Addressed             This Visit's Progress    Patient Stated       Would like to lose 20 lbs.       Depression Screen    07/31/2022   11:48 AM 07/22/2022   12:26 PM 07/29/2021   11:01 AM 07/25/2020    2:34 PM 02/17/2019   10:12 AM 02/15/2018    8:52 AM 02/09/2017    9:05 AM  PHQ 2/9 Scores  PHQ - 2 Score 0 0 0 0 0 0 1  PHQ- 9 Score 0 1   1      Fall Risk    07/31/2022   11:50 AM 07/22/2022   12:26 PM 07/29/2021   10:57 AM 07/25/2020    2:34 PM  Fall Risk   Falls in the past year? 0 0 0 0  Number falls in past yr: 0 0    Injury with Fall? 0 0    Risk for fall due to : No Fall Risks No Fall Risks    Follow up Falls prevention discussed;Falls evaluation completed Falls evaluation completed Falls evaluation completed Falls evaluation completed    FALL RISK PREVENTION PERTAINING TO THE HOME:  Any stairs in or around the home? Yes  If so, are there any without handrails? No  Home free of loose throw rugs in walkways, pet beds, electrical cords, etc? Yes  Adequate lighting in your home to reduce risk of falls? Yes   ASSISTIVE DEVICES UTILIZED TO PREVENT FALLS:  Life alert? No  Use of a cane, walker or w/c? No  Grab bars in the bathroom? No  Shower chair or bench in shower? Yes  Elevated toilet seat or a handicapped  toilet? No   Cognitive Function:        07/31/2022   11:53 AM  6CIT Screen  What Year? 0 points  What month? 0 points  What time? 0 points  Count back from 20 0 points  Months in reverse 0 points  Repeat phrase 0 points  Total Score 0 points    Immunizations Immunization History  Administered Date(s) Administered   COVID-19, mRNA, vaccine(Comirnaty)12 years and older 04/02/2022   Fluad Quad(high Dose 65+) 05/08/2020, 07/29/2021   Influenza Split 04/22/2011, 04/27/2012   Influenza,inj,Quad PF,6+ Mos 06/22/2013, 02/05/2015, 02/08/2016, 02/09/2017, 02/15/2018, 03/24/2019   Influenza-Unspecified 03/03/2022   PFIZER(Purple Top)SARS-COV-2 Vaccination 08/08/2019, 08/29/2019, 02/28/2020   Pneumococcal Conjugate-13 08/07/2020   Td 08/28/1997, 02/26/2007   Tdap 04/27/2012, 04/02/2022    TDAP status: Up to date  Flu Vaccine status: Up to date  Pneumococcal vaccine status: Due, Education has been provided regarding the importance of this vaccine. Advised may receive this vaccine at local pharmacy or Health Dept. Aware to provide a copy of the vaccination record if obtained from local pharmacy or Health Dept. Verbalized acceptance and understanding.  Covid-19 vaccine status: Information provided on how to obtain vaccines.   Qualifies for Shingles Vaccine? Yes   Zostavax completed No   Shingrix Completed?: No.    Education has been provided regarding the importance of this vaccine. Patient has been advised to call insurance company to determine out of pocket expense if they have not yet received this vaccine. Advised may also receive vaccine at local pharmacy or Health Dept. Verbalized acceptance and understanding.  Screening Tests  Health Maintenance  Topic Date Due   Hepatitis C Screening  Never done   Zoster Vaccines- Shingrix (1 of 2) Never done   Pneumonia Vaccine 51+ Years old (2 of 2 - PPSV23 or PCV20) 08/07/2021   COLONOSCOPY (Pts 45-53yr Insurance coverage will need to be  confirmed)  09/10/2022   MAMMOGRAM  02/14/2023   Medicare Annual Wellness (AWV)  07/31/2023   DTaP/Tdap/Td (5 - Td or Tdap) 04/02/2032   INFLUENZA VACCINE  Completed   DEXA SCAN  Completed   COVID-19 Vaccine  Completed   HPV VACCINES  Aged Out    Health Maintenance  Health Maintenance Due  Topic Date Due   Hepatitis C Screening  Never done   Zoster Vaccines- Shingrix (1 of 2) Never done   Pneumonia Vaccine 68 Years old (2 of 2 - PPSV23 or PCV20) 08/07/2021    Colorectal cancer screening: Type of screening: Colonoscopy. Completed 09/09/2017. Repeat every 5 years scheduled for 08/29/2022.  Mammogram status: Completed 02/13/2022. Repeat every year  Bone Density status: Completed 08/15/2021. Results reflect: Bone density results: NORMAL. Repeat every 5 years.  Lung Cancer Screening: (Low Dose CT Chest recommended if Age 68-80years, 30 pack-year currently smoking OR have quit w/in 15years.) does not qualify.   Lung Cancer Screening Referral: no  Additional Screening:  Hepatitis C Screening: does qualify; Completed no  Vision Screening: Recommended annual ophthalmology exams for early detection of glaucoma and other disorders of the eye. Is the patient up to date with their annual eye exam?   scheduled 08/04/2022 Who is the provider or what is the name of the office in which the patient attends annual eye exams? [facility] If pt is not established with a provider, would they like to be referred to a provider to establish care? No .   Dental Screening: Recommended annual dental exams for proper oral hygiene  Community Resource Referral / Chronic Care Management: CRR required this visit?  No   CCM required this visit?  No      Plan:     I have personally reviewed and noted the following in the patient's chart:   Medical and social history Use of alcohol, tobacco or illicit drugs  Current medications and supplements including opioid prescriptions. Patient  is not currently taking opioid prescriptions. Functional ability and status Nutritional status Physical activity Advanced directives List of other physicians Hospitalizations, surgeries, and ER visits in previous 12 months Vitals Screenings to include cognitive, depression, and falls Referrals and appointments  In addition, I have reviewed and discussed with patient certain preventive protocols, quality metrics, and best practice recommendations. A written personalized care plan for preventive services as well as general preventive health recommendations were provided to patient.     CLebron Conners LPN   [id number]  Nurse Notes: none

## 2022-07-31 NOTE — Patient Instructions (Signed)
Tracy Blankenship , Thank you for taking time to come for your Medicare Wellness Visit. I appreciate your ongoing commitment to your health goals. Please review the following plan we discussed and let me know if I can assist you in the future.   These are the goals we discussed:  Goals      Patient Stated     Would like to lose 20 lbs.        This is a list of the screening recommended for you and due dates:  Health Maintenance  Topic Date Due   Hepatitis C Screening: USPSTF Recommendation to screen - Ages 32-79 yo.  Never done   Zoster (Shingles) Vaccine (1 of 2) Never done   Pneumonia Vaccine (2 of 2 - PPSV23 or PCV20) 08/07/2021   Colon Cancer Screening  09/10/2022   Mammogram  02/14/2023   Medicare Annual Wellness Visit  07/31/2023   DTaP/Tdap/Td vaccine (5 - Td or Tdap) 04/02/2032   Flu Shot  Completed   DEXA scan (bone density measurement)  Completed   COVID-19 Vaccine  Completed   HPV Vaccine  Aged Out    Advanced directives: Please bring a copy of your health care power of attorney and living will to the office to be added to your chart at your convenience.   Conditions/risks identified: Aim for 30 minutes of exercise or brisk walking, 6-8 glasses of water, and 5 servings of fruits and vegetables each day.   Next appointment: Follow up in one year for your annual wellness visit 08/05/23 @ 3:15 via telephone    Preventive Care 65 Years and Older, Female Preventive care refers to lifestyle choices and visits with your health care provider that can promote health and wellness. What does preventive care include? A yearly physical exam. This is also called an annual well check. Dental exams once or twice a year. Routine eye exams. Ask your health care provider how often you should have your eyes checked. Personal lifestyle choices, including: Daily care of your teeth and gums. Regular physical activity. Eating a healthy diet. Avoiding tobacco and drug use. Limiting alcohol  use. Practicing safe sex. Taking low-dose aspirin every day. Taking vitamin and mineral supplements as recommended by your health care provider. What happens during an annual well check? The services and screenings done by your health care provider during your annual well check will depend on your age, overall health, lifestyle risk factors, and family history of disease. Counseling  Your health care provider may ask you questions about your: Alcohol use. Tobacco use. Drug use. Emotional well-being. Home and relationship well-being. Sexual activity. Eating habits. History of falls. Memory and ability to understand (cognition). Work and work Statistician. Reproductive health. Screening  You may have the following tests or measurements: Height, weight, and BMI. Blood pressure. Lipid and cholesterol levels. These may be checked every 5 years, or more frequently if you are over 43 years old. Skin check. Lung cancer screening. You may have this screening every year starting at age 49 if you have a 30-pack-year history of smoking and currently smoke or have quit within the past 15 years. Fecal occult blood test (FOBT) of the stool. You may have this test every year starting at age 25. Flexible sigmoidoscopy or colonoscopy. You may have a sigmoidoscopy every 5 years or a colonoscopy every 10 years starting at age 49. Hepatitis C blood test. Hepatitis B blood test. Sexually transmitted disease (STD) testing. Diabetes screening. This is done by checking your blood  sugar (glucose) after you have not eaten for a while (fasting). You may have this done every 1-3 years. Bone density scan. This is done to screen for osteoporosis. You may have this done starting at age 54. Mammogram. This may be done every 1-2 years. Talk to your health care provider about how often you should have regular mammograms. Talk with your health care provider about your test results, treatment options, and if necessary,  the need for more tests. Vaccines  Your health care provider may recommend certain vaccines, such as: Influenza vaccine. This is recommended every year. Tetanus, diphtheria, and acellular pertussis (Tdap, Td) vaccine. You may need a Td booster every 10 years. Zoster vaccine. You may need this after age 10. Pneumococcal 13-valent conjugate (PCV13) vaccine. One dose is recommended after age 52. Pneumococcal polysaccharide (PPSV23) vaccine. One dose is recommended after age 26. Talk to your health care provider about which screenings and vaccines you need and how often you need them. This information is not intended to replace advice given to you by your health care provider. Make sure you discuss any questions you have with your health care provider. Document Released: 06/08/2015 Document Revised: 01/30/2016 Document Reviewed: 03/13/2015 Elsevier Interactive Patient Education  2017 [place] Prevention in the Home Falls can cause injuries. They can happen to people of all ages. There are many things you can do to make your home safe and to help prevent falls. What can I do on the outside of my home? Regularly fix the edges of walkways and driveways and fix any cracks. Remove anything that might make you trip as you walk through a door, such as a raised step or threshold. Trim any bushes or trees on the path to your home. Use bright outdoor lighting. Clear any walking paths of anything that might make someone trip, such as rocks or tools. Regularly check to see if handrails are loose or broken. Make sure that both sides of any steps have handrails. Any raised decks and porches should have guardrails on the edges. Have any leaves, snow, or ice cleared regularly. Use sand or salt on walking paths during winter. Clean up any spills in your garage right away. This includes oil or grease spills. What can I do in the bathroom? Use night lights. Install grab bars by the toilet and in the  tub and shower. Do not use towel bars as grab bars. Use non-skid mats or decals in the tub or shower. If you need to sit down in the shower, use a plastic, non-slip stool. Keep the floor dry. Clean up any water that spills on the floor as soon as it happens. Remove soap buildup in the tub or shower regularly. Attach bath mats securely with double-sided non-slip rug tape. Do not have throw rugs and other things on the floor that can make you trip. What can I do in the bedroom? Use night lights. Make sure that you have a light by your bed that is easy to reach. Do not use any sheets or blankets that are too big for your bed. They should not hang down onto the floor. Have a firm chair that has side arms. You can use this for support while you get dressed. Do not have throw rugs and other things on the floor that can make you trip. What can I do in the kitchen? Clean up any spills right away. Avoid walking on wet floors. Keep items that you use a lot in easy-to-reach  places. If you need to reach something above you, use a strong step stool that has a grab bar. Keep electrical cords out of the way. Do not use floor polish or wax that makes floors slippery. If you must use wax, use non-skid floor wax. Do not have throw rugs and other things on the floor that can make you trip. What can I do with my stairs? Do not leave any items on the stairs. Make sure that there are handrails on both sides of the stairs and use them. Fix handrails that are broken or loose. Make sure that handrails are as long as the stairways. Check any carpeting to make sure that it is firmly attached to the stairs. Fix any carpet that is loose or worn. Avoid having throw rugs at the top or bottom of the stairs. If you do have throw rugs, attach them to the floor with carpet tape. Make sure that you have a light switch at the top of the stairs and the bottom of the stairs. If you do not have them, ask someone to add them for  you. What else can I do to help prevent falls? Wear shoes that: Do not have high heels. Have rubber bottoms. Are comfortable and fit you well. Are closed at the toe. Do not wear sandals. If you use a stepladder: Make sure that it is fully opened. Do not climb a closed stepladder. Make sure that both sides of the stepladder are locked into place. Ask someone to hold it for you, if possible. Clearly mark and make sure that you can see: Any grab bars or handrails. First and last steps. Where the edge of each step is. Use tools that help you move around (mobility aids) if they are needed. These include: Canes. Walkers. Scooters. Crutches. Turn on the lights when you go into a dark area. Replace any light bulbs as soon as they burn out. Set up your furniture so you have a clear path. Avoid moving your furniture around. If any of your floors are uneven, fix them. If there are any pets around you, be aware of where they are. Review your medicines with your doctor. Some medicines can make you feel dizzy. This can increase your chance of falling. Ask your doctor what other things that you can do to help prevent falls. This information is not intended to replace advice given to you by your health care provider. Make sure you discuss any questions you have with your health care provider. Document Released: 03/08/2009 Document Revised: 10/18/2015 Document Reviewed: 06/16/2014 Elsevier Interactive Patient Education  2017 [employer].

## 2022-08-04 DIAGNOSIS — H2513 Age-related nuclear cataract, bilateral: Secondary | ICD-10-CM | POA: Diagnosis not present

## 2022-08-04 DIAGNOSIS — H52203 Unspecified astigmatism, bilateral: Secondary | ICD-10-CM | POA: Diagnosis not present

## 2022-08-04 DIAGNOSIS — H02403 Unspecified ptosis of bilateral eyelids: Secondary | ICD-10-CM | POA: Diagnosis not present

## 2022-08-05 ENCOUNTER — Encounter: Payer: Self-pay | Admitting: Family Medicine

## 2022-08-05 ENCOUNTER — Ambulatory Visit (INDEPENDENT_AMBULATORY_CARE_PROVIDER_SITE_OTHER): Payer: Medicare PPO | Admitting: Family Medicine

## 2022-08-05 VITALS — BP 138/80 | HR 88 | Temp 97.3°F | Ht 63.25 in | Wt 164.5 lb

## 2022-08-05 DIAGNOSIS — R109 Unspecified abdominal pain: Secondary | ICD-10-CM | POA: Diagnosis not present

## 2022-08-05 DIAGNOSIS — R9389 Abnormal findings on diagnostic imaging of other specified body structures: Secondary | ICD-10-CM

## 2022-08-05 DIAGNOSIS — R030 Elevated blood-pressure reading, without diagnosis of hypertension: Secondary | ICD-10-CM | POA: Diagnosis not present

## 2022-08-05 DIAGNOSIS — R7303 Prediabetes: Secondary | ICD-10-CM

## 2022-08-05 DIAGNOSIS — E785 Hyperlipidemia, unspecified: Secondary | ICD-10-CM

## 2022-08-05 DIAGNOSIS — R0789 Other chest pain: Secondary | ICD-10-CM | POA: Diagnosis not present

## 2022-08-05 DIAGNOSIS — Z0000 Encounter for general adult medical examination without abnormal findings: Secondary | ICD-10-CM

## 2022-08-05 NOTE — Assessment & Plan Note (Signed)
Lab Results  Component Value Date   HGBA1C 5.6 07/29/2022   disc imp of low glycemic diet and wt loss to prevent DM2

## 2022-08-05 NOTE — Assessment & Plan Note (Signed)
Disc goals for lipids and reasons to control them Rev last labs with pt Rev low sat fat diet in detail LDL is down to 121   Diet is improving  Disc need to conitnue cutting fatty pork

## 2022-08-05 NOTE — Progress Notes (Signed)
Subjective:    Patient ID: Tracy Blankenship, female    DOB: 16-Dec-1955, 67 y.o.   MRN: [id number]  HPI Here for health maintenance exam and to review chronic medical problems   Wt Readings from Last 3 Encounters:  08/05/22 164 lb 8 oz (74.6 kg)  07/31/22 163 lb (73.9 kg)  07/28/22 163 lb 8 oz (74.2 kg)   28.91 kg/m  Vitals:   08/05/22 0852  BP: (!) 142/68  Pulse: 88  Temp: (!) 97.3 F (36.3 C)  SpO2: 95%     Immunization History  Administered Date(s) Administered   COVID-19, mRNA, vaccine(Comirnaty)12 years and older 04/02/2022   Fluad Quad(high Dose 65+) 05/08/2020, 07/29/2021   Influenza Split 04/22/2011, 04/27/2012   Influenza,inj,Quad PF,6+ Mos 06/22/2013, 02/05/2015, 02/08/2016, 02/09/2017, 02/15/2018, 03/24/2019   Influenza-Unspecified 03/03/2022   PFIZER(Purple Top)SARS-COV-2 Vaccination 08/08/2019, 08/29/2019, 02/28/2020   Pneumococcal Conjugate-13 08/07/2020   Td 08/28/1997, 02/26/2007   Tdap 04/27/2012, 04/02/2022   Health Maintenance Due  Topic Date Due   Hepatitis C Screening  Never done   Zoster Vaccines- Shingrix (1 of 2) Never done   Pneumonia Vaccine 80+ Years old (2 of 2 - PPSV23 or PCV20) 08/07/2021   Shingrix: is interested and will get at pharmacy   Pna vaccine had prev 13 in 07/2020  Eye doctor yesterday- all was fine   Colonoscopy 08/2017 with 5 y recall - due in April, Dr Hilarie Fredrickson  Scheduled on 08/29/22  Mammogram 01/2022  Self breast exam: no lumps   Dexa 07/2020-normal but down from prior  Sister has osteoporosis   Falls: none Fractures:none  Supplements -vit D Exercise : very active/ carries furniture    Better about fluids Making herself drink more water   L abd pain - better with miralax and fluids/water  Is eating better also  L chest wall pain - tx for interstitial ? Pna with zpack- is better  Will need cxr at and of the month    Hyperlipidemia Lab Results  Component Value Date   CHOL 206 (H) 07/29/2022   CHOL 216 (H)  07/23/2021   CHOL 217 (H) 07/18/2020   Lab Results  Component Value Date   HDL 57.30 07/29/2022   HDL 56.70 07/23/2021   HDL 54.20 07/18/2020   Lab Results  Component Value Date   LDLCALC 121 (H) 07/29/2022   LDLCALC 137 (H) 07/23/2021   LDLCALC 133 (H) 07/18/2020   Lab Results  Component Value Date   TRIG 141.0 07/29/2022   TRIG 110.0 07/23/2021   TRIG 146.0 07/18/2020   Lab Results  Component Value Date   CHOLHDL 4 07/29/2022   CHOLHDL 4 07/23/2021   CHOLHDL 4 07/18/2020   Lab Results  Component Value Date   LDLDIRECT 126.5 12/17/2009   LDL is down Red meat twice a month  Less bacon/ trying [place] bacon   Has regular bacon once per wk instead of daily    Prediabetes Lab Results  Component Value Date   HGBA1C 5.6 07/29/2022    Stable   Other labs Lab Results  Component Value Date   CREATININE 0.85 07/29/2022   BUN 16 07/29/2022   NA 140 07/29/2022   K 4.3 07/29/2022   CL 101 07/29/2022   CO2 29 07/29/2022   Lab Results  Component Value Date   ALT 12 07/29/2022   AST 16 07/29/2022   ALKPHOS 80 07/29/2022   BILITOT 0.5 07/29/2022   Lab Results  Component Value Date   CALCIUM 9.8 07/29/2022  PHOS 3.4 05/08/2020   Last ca was high  Told to hold her ca   Lab Results  Component Value Date   WBC 4.8 07/29/2022   HGB 14.9 07/29/2022   HCT 44.4 07/29/2022   MCV 86.0 07/29/2022   PLT 298.0 07/29/2022   Lab Results  Component Value Date   TSH 0.81 07/29/2022     Patient Active Problem List   Diagnosis Date Noted   Abnormal chest x-ray 08/05/2022   Elevated blood pressure reading 07/28/2022   History of colon polyps 07/22/2022   Medicare annual wellness visit, initial 07/25/2020   Estrogen deficiency 07/25/2020   Eczema 02/09/2017   Mild hyperlipidemia 02/08/2016   H/O herpes zoster 01/31/2014   Vaginal dryness 10/29/2012   Left-sided chest wall pain 06/01/2012   Left lateral abdominal pain 04/29/2011   Encounter for routine  gynecological examination 04/22/2011   Routine general medical examination at a health care facility 03/30/2011   Urinary incontinence 01/07/2011   Allergic rhinitis 12/21/2009   INTERSTITIAL CYSTITIS 12/08/2006   Prediabetes 12/08/2006   Past Medical History:  Diagnosis Date   Allergy    Chronic sinusitis    Hyperglycemia    IC (interstitial cystitis)    Plantar fasciitis    Stress reaction    Trigger finger    Past Surgical History:  Procedure Laterality Date   APPENDECTOMY     CESAREAN SECTION     X 2   WISDOM TOOTH EXTRACTION     Social History   Tobacco Use   Smoking status: Never   Smokeless tobacco: Never  Vaping Use   Vaping Use: Never used  Substance Use Topics   Alcohol use: Yes    Alcohol/week: 0.0 standard drinks of alcohol    Comment: wine -every 3-4 months   Drug use: No   Family History  Problem Relation Age of Onset   Cardiomyopathy Father    Diabetes Father    Stroke Father    Anxiety disorder Mother    Lupus Mother    Emphysema Mother    Coronary artery disease Mother    Lung cancer Mother    Hypertension Mother    Heart disease Mother    COPD Mother    Kidney disease Mother    Colon cancer Neg Hx    Breast cancer Neg Hx    Colon polyps Neg Hx    Esophageal cancer Neg Hx    Rectal cancer Neg Hx    Stomach cancer Neg Hx    Allergies  Allergen Reactions   Claritin [Loratadine] Other (See Comments)    Felt jittery   Penicillins     REACTION: ? allergy   Pseudoephedrine     REACTION: makes her hyper   Current Outpatient Medications on File Prior to Visit  Medication Sig Dispense Refill   Calcium Carbonate-Vit D-Min (CALCIUM 1200) 1200-1000 MG-UNIT CHEW Chew by mouth.     Cetirizine HCl 10 MG CAPS Take 1 capsule by mouth daily as needed.     famotidine (PEPCID) 20 MG tablet Take 1 tablet (20 mg total) by mouth 2 (two) times daily as needed for heartburn or indigestion. 60 tablet 11   Multiple Vitamin (MULTIVITAMIN) tablet Take 1  tablet by mouth daily.     polyethylene glycol powder (MIRALAX) 17 GM/SCOOP powder Take 1 Container by mouth daily as needed.     Triamcinolone Acetonide (NASACORT ALLERGY 24HR NA) Place 2 sprays into the nose daily.     No current facility-administered  medications on file prior to visit.    Review of Systems  Constitutional:  Positive for fatigue. Negative for activity change, appetite change, fever and unexpected weight change.  HENT:  Negative for congestion, ear pain, rhinorrhea, sinus pressure and sore throat.   Eyes:  Negative for pain, redness and visual disturbance.  Respiratory:  Negative for cough, shortness of breath and wheezing.   Cardiovascular:  Negative for chest pain and palpitations.  Gastrointestinal:  Positive for constipation. Negative for abdominal pain, blood in stool and diarrhea.  Endocrine: Negative for polydipsia and polyuria.  Genitourinary:  Negative for dysuria, frequency and urgency.  Musculoskeletal:  Negative for arthralgias, back pain and myalgias.  Skin:  Negative for pallor and rash.  Allergic/Immunologic: Negative for environmental allergies.  Neurological:  Negative for dizziness, syncope and headaches.  Hematological:  Negative for adenopathy. Does not bruise/bleed easily.  Psychiatric/Behavioral:  Negative for decreased concentration and dysphoric mood. The patient is not nervous/anxious.        Objective:   Physical Exam Constitutional:      General: She is not in acute distress.    Appearance: Normal appearance. She is well-developed and normal weight. She is not ill-appearing or diaphoretic.  HENT:     Head: Normocephalic and atraumatic.     Right Ear: Tympanic membrane, ear canal and external ear normal.     Left Ear: Tympanic membrane, ear canal and external ear normal.     Nose: Nose normal. No congestion.     Mouth/Throat:     Mouth: Mucous membranes are moist.     Pharynx: Oropharynx is clear. No posterior oropharyngeal erythema.   Eyes:     General: No scleral icterus.    Extraocular Movements: Extraocular movements intact.     Conjunctiva/sclera: Conjunctivae normal.     Pupils: Pupils are equal, round, and reactive to light.  Neck:     Thyroid: No thyromegaly.     Vascular: No carotid bruit or JVD.  Cardiovascular:     Rate and Rhythm: Normal rate and regular rhythm.     Pulses: Normal pulses.     Heart sounds: Normal heart sounds.     No gallop.  Pulmonary:     Effort: Pulmonary effort is normal. No respiratory distress.     Breath sounds: Normal breath sounds. No wheezing.     Comments: Good air exch Chest:     Chest wall: No tenderness.  Abdominal:     General: Bowel sounds are normal. There is no distension or abdominal bruit.     Palpations: Abdomen is soft. There is no mass.     Tenderness: There is no abdominal tenderness.     Hernia: No hernia is present.  Genitourinary:    Comments: Breast exam: No mass, nodules, thickening, tenderness, bulging, retraction, inflamation, nipple discharge or skin changes noted.  No axillary or clavicular LA.     Musculoskeletal:        General: No tenderness. Normal range of motion.     Cervical back: Normal range of motion and neck supple. No rigidity. No muscular tenderness.     Right lower leg: No edema.     Left lower leg: No edema.     Comments: No kyphosis   Lymphadenopathy:     Cervical: No cervical adenopathy.  Skin:    General: Skin is warm and dry.     Coloration: Skin is not pale.     Findings: No erythema or rash.     Comments:  Solar lentigines diffusely   Neurological:     Mental Status: She is alert. Mental status is at baseline.     Cranial Nerves: No cranial nerve deficit.     Motor: No abnormal muscle tone.     Coordination: Coordination normal.     Gait: Gait normal.     Deep Tendon Reflexes: Reflexes are normal and symmetric. Reflexes normal.  Psychiatric:        Mood and Affect: Mood normal.        Cognition and Memory: Cognition  and memory normal.           Assessment & Plan:   Problem List Items Addressed This Visit       Other   Abnormal chest x-ray   Elevated blood pressure reading    BP: 138/80  Will start to monitor at home when relaxed Has fam hx so will likely have HTN in the future       Left lateral abdominal pain    Improved Colonoscopy and GI f/u planned       Left-sided chest wall pain    Improved  after tx for poss interstitial pna  Wil re check cxr at end of the month      Mild hyperlipidemia    Disc goals for lipids and reasons to control them Rev last labs with pt Rev low sat fat diet in detail LDL is down to 121   Diet is improving  Disc need to conitnue cutting fatty pork       Prediabetes    Lab Results  Component Value Date   HGBA1C 5.6 07/29/2022  disc imp of low glycemic diet and wt loss to prevent DM2       Routine general medical examination at a health care facility - Primary    Reviewed health habits including diet and exercise and skin cancer prevention Reviewed appropriate screening tests for age  Also reviewed health mt list, fam hx and immunization status , as well as social and family history   See HPI Labs reviewed  Interested in shingrix at pharmacy  Will plan pna vaccine in 3 months since she is getting over pna  Eye exam utd  Colonoscopy scheduled next mo  Mammogram utd 01/2022  Dexa nl 2 y ago/ no falls or fx and takes vit D for bone health

## 2022-08-05 NOTE — Assessment & Plan Note (Signed)
Reviewed health habits including diet and exercise and skin cancer prevention Reviewed appropriate screening tests for age  Also reviewed health mt list, fam hx and immunization status , as well as social and family history   See HPI Labs reviewed  Interested in shingrix at pharmacy  Will plan pna vaccine in 3 months since she is getting over pna  Eye exam utd  Colonoscopy scheduled next mo  Mammogram utd 01/2022  Dexa nl 2 y ago/ no falls or fx and takes vit D for bone health

## 2022-08-05 NOTE — Assessment & Plan Note (Signed)
Improved Colonoscopy and GI f/u planned

## 2022-08-05 NOTE — Patient Instructions (Addendum)
Let's make a nurse appt for pneumonia vaccine in 3 months   If you are interested in the new shingles vaccine (Shingrix) - call your local pharmacy to check on coverage and availability  If affordable, get on a wait list at your pharmacy to get the vaccine.  Take vitamin D 2000 iu daily  Drink lots of fluids Eat high calcium foods   Stay active  Add exercise when you can  Chair yoga is great  Light weights / bands / U tube video   Get a blood pressure cuff for home  I like OMRON for arm size regular Check only when relaxed with arm supported at heart level  Keep a lot   Goal is top number under 140 and bottom under 90   Take a look at the [facility] eating plan

## 2022-08-05 NOTE — Assessment & Plan Note (Signed)
BP: 138/80  Will start to monitor at home when relaxed Has fam hx so will likely have HTN in the future

## 2022-08-05 NOTE — Assessment & Plan Note (Signed)
Improved  after tx for poss interstitial pna  Wil re check cxr at end of the month

## 2022-08-08 ENCOUNTER — Encounter: Payer: Self-pay | Admitting: Internal Medicine

## 2022-08-08 ENCOUNTER — Ambulatory Visit (AMBULATORY_SURGERY_CENTER): Payer: Medicare PPO | Admitting: *Deleted

## 2022-08-08 VITALS — Ht 63.0 in | Wt 165.0 lb

## 2022-08-08 DIAGNOSIS — Z86010 Personal history of colonic polyps: Secondary | ICD-10-CM

## 2022-08-08 MED ORDER — NA SULFATE-K SULFATE-MG SULF 17.5-3.13-1.6 GM/177ML PO SOLN: 1.0000 | Freq: Once | ORAL | 0 refills | Status: AC

## 2022-08-08 NOTE — Progress Notes (Signed)
No egg or soy allergy known to patient  No issues known to pt with past sedation with any surgeries or procedures Patient denies ever being intubated No issues with moving head, neck or swallow  No FH of Malignant Hyperthermia Pt is not on diet pills Pt is not on  home 02  Pt is not on blood thinners  Pt has issues with constipation uses Miralax daily Saw PCP and  Pt is not on dialysis Pt denies any upcoming cardiac testing Pt encouraged to use to use Singlecare or Goodrx to reduce cost  Patient's chart reviewed by Tracy Blankenship CNRA prior to previsit and patient appropriate for the [place].  Previsit completed and red dot placed by patient's name on their procedure day (on provider's schedule).  . Visit by phone Instructions reviewed with pt and pt states understanding. Instructed to review again prior to procedure. Pt states they will.  Instruction sent by mail with coupon and by my chart

## 2022-08-20 ENCOUNTER — Encounter: Payer: Self-pay | Admitting: Family Medicine

## 2022-08-20 ENCOUNTER — Ambulatory Visit (INDEPENDENT_AMBULATORY_CARE_PROVIDER_SITE_OTHER)
Admission: RE | Admit: 2022-08-20 | Discharge: 2022-08-20 | Disposition: A | Discharge status: Home / Self Care | Payer: Medicare PPO | Source: Ambulatory Visit | Attending: Family Medicine | Admitting: Family Medicine

## 2022-08-20 DIAGNOSIS — R9389 Abnormal findings on diagnostic imaging of other specified body structures: Secondary | ICD-10-CM

## 2022-08-20 DIAGNOSIS — J849 Interstitial pulmonary disease, unspecified: Secondary | ICD-10-CM | POA: Diagnosis not present

## 2022-08-25 ENCOUNTER — Other Ambulatory Visit: Payer: Medicare PPO

## 2022-08-25 ENCOUNTER — Encounter: Payer: Self-pay | Admitting: Family Medicine

## 2022-08-25 ENCOUNTER — Ambulatory Visit (INDEPENDENT_AMBULATORY_CARE_PROVIDER_SITE_OTHER): Payer: Medicare PPO | Admitting: Family Medicine

## 2022-08-25 VITALS — BP 131/82 | HR 89 | Temp 98.3°F | Ht 63.0 in | Wt 163.1 lb

## 2022-08-25 DIAGNOSIS — R059 Cough, unspecified: Secondary | ICD-10-CM | POA: Diagnosis not present

## 2022-08-25 DIAGNOSIS — J069 Acute upper respiratory infection, unspecified: Secondary | ICD-10-CM | POA: Diagnosis not present

## 2022-08-25 DIAGNOSIS — R9389 Abnormal findings on diagnostic imaging of other specified body structures: Secondary | ICD-10-CM | POA: Diagnosis not present

## 2022-08-25 LAB — POC COVID19 BINAXNOW: SARS Coronavirus 2 Ag: NEGATIVE

## 2022-08-25 MED ORDER — BENZONATATE 200 MG PO CAPS: 200.0000 mg | ORAL_CAPSULE | Freq: Three times a day (TID) | ORAL | 1 refills | Status: DC | PRN

## 2022-08-25 NOTE — Assessment & Plan Note (Signed)
Interstitial changes continued even after abx tx for poss interstitial pna   Now has new uri symptoms= ? If related  Will get CT this week as planned

## 2022-08-25 NOTE — Assessment & Plan Note (Signed)
This is new 2-3 d with neg covid test Unsure /doubt related to her recent interstitial change on cxr   (of note has felt more tired in general however)  Reassuring exam Symptoms care reviewed-see AVS Watch for wheeze/tight breathing or fever  CT scan planned this week of chest also ER precautions rev in detail Update if not starting to improve in a week or if worsening

## 2022-08-25 NOTE — Progress Notes (Signed)
Subjective:    Patient ID: Tracy Blankenship, female    DOB: 02-05-1956, 67 y.o.   MRN: [id number]  HPI Pt presents for ongoing respiratory issues   Wt Readings from Last 3 Encounters:  08/25/22 163 lb 2 oz (74 kg)  08/08/22 165 lb (74.8 kg)  08/05/22 164 lb 8 oz (74.6 kg)   28.90 kg/m  Vitals:   08/25/22 1218  BP: (!) 148/82  Pulse: 89  Temp: 98.3 F (36.8 C)  SpO2: 95%     DG Chest 2 View  Result Date: 08/20/2022 CLINICAL DATA:  Follow-up interstitial changes. EXAM: CHEST - 2 VIEW COMPARISON:  July 22, 2022 FINDINGS: The heart size and mediastinal contours are within normal limits. Mild increased pulmonary interstitium is identified bilaterally stable. No focal consolidation or pleural effusion is identified. The visualized skeletal structures are unremarkable. IMPRESSION: Mild increased pulmonary interstitium bilaterally unchanged compared prior exam. Electronically Signed   By: Abelardo Diesel M.D.   On: 08/20/2022 11:17    She has CT scan planned on 4/3   Originally treated for possible pneumonia  At that time she had some L sided chest wall pain   These symptoms started a few days ago  ? If related or something  Also has just not felt well all month (just felt blah, no energy but no resp symptoms)   Some headache No ear pain  Throat is irritated but not really sore No fever  No chills or body aches  Some sinus pain /pressure   Both stuffy and runny nose   Cough is non prod  Can hear the congestion /rattle  No wheeze or tightness  A little hoarse   Otc 1/2 zyrtec  Cough syrup at bedtime- otc  / robitussin    ? Does she have to re schedule colonoscopy   Results for orders placed or performed in visit on 08/25/22  POC COVID-19  Result Value Ref Range   SARS Coronavirus 2 Ag Negative Negative      Patient Active Problem List   Diagnosis Date Noted   Abnormal chest x-ray 08/05/2022   Elevated blood pressure reading 07/28/2022   History of  colon polyps 07/22/2022   Medicare annual wellness visit, initial 07/25/2020   Estrogen deficiency 07/25/2020   Eczema 02/09/2017   Mild hyperlipidemia 02/08/2016   H/O herpes zoster 01/31/2014   Vaginal dryness 10/29/2012   Left-sided chest wall pain 06/01/2012   Left lateral abdominal pain 04/29/2011   Encounter for routine gynecological examination 04/22/2011   Routine general medical examination at a health care facility 03/30/2011   Urinary incontinence 01/07/2011   Viral URI with cough 03/06/2010   Allergic rhinitis 12/21/2009   INTERSTITIAL CYSTITIS 12/08/2006   Prediabetes 12/08/2006   Past Medical History:  Diagnosis Date   Allergy    Chronic sinusitis    Constipation    Hyperglycemia    IC (interstitial cystitis)    Plantar fasciitis    Pneumonia    Stress reaction    Trigger finger    Past Surgical History:  Procedure Laterality Date   APPENDECTOMY     CESAREAN SECTION     X 2   WISDOM TOOTH EXTRACTION     Social History   Tobacco Use   Smoking status: Never   Smokeless tobacco: Never  Vaping Use   Vaping Use: Never used  Substance Use Topics   Alcohol use: Yes    Alcohol/week: 0.0 standard drinks of alcohol    Comment:  wine -every 3-4 months   Drug use: No   Family History  Problem Relation Age of Onset   Cardiomyopathy Father    Diabetes Father    Stroke Father    Anxiety disorder Mother    Lupus Mother    Emphysema Mother    Coronary artery disease Mother    Lung cancer Mother    Hypertension Mother    Heart disease Mother    COPD Mother    Kidney disease Mother    Colon cancer Neg Hx    Breast cancer Neg Hx    Colon polyps Neg Hx    Esophageal cancer Neg Hx    Rectal cancer Neg Hx    Stomach cancer Neg Hx    Allergies  Allergen Reactions   Claritin [Loratadine] Other (See Comments)    Felt jittery   Penicillins     REACTION: ? allergy   Pseudoephedrine     REACTION: makes her hyper   Current Outpatient Medications on File  Prior to Visit  Medication Sig Dispense Refill   Calcium Carbonate-Vit D-Min (CALCIUM 1200) 1200-1000 MG-UNIT CHEW Chew by mouth.     Cetirizine HCl 10 MG CAPS Take 1 capsule by mouth daily as needed.     famotidine (PEPCID) 20 MG tablet Take 1 tablet (20 mg total) by mouth 2 (two) times daily as needed for heartburn or indigestion. 60 tablet 11   Multiple Vitamin (MULTIVITAMIN) tablet Take 1 tablet by mouth daily.     polyethylene glycol powder (MIRALAX) 17 GM/SCOOP powder Take 1 Container by mouth daily as needed.     Triamcinolone Acetonide (NASACORT ALLERGY 24HR NA) Place 2 sprays into the nose daily.     No current facility-administered medications on file prior to visit.     Review of Systems  Constitutional:  Positive for appetite change and fatigue. Negative for fever.  HENT:  Positive for congestion, postnasal drip, rhinorrhea, sinus pressure, sneezing, sore throat and voice change. Negative for ear pain and trouble swallowing.   Eyes:  Negative for pain and discharge.  Respiratory:  Positive for cough. Negative for shortness of breath, wheezing and stridor.   Cardiovascular:  Negative for chest pain.  Gastrointestinal:  Negative for diarrhea, nausea and vomiting.  Genitourinary:  Negative for frequency, hematuria and urgency.  Musculoskeletal:  Negative for arthralgias and myalgias.  Skin:  Negative for rash.  Neurological:  Positive for headaches. Negative for dizziness, weakness and light-headedness.  Psychiatric/Behavioral:  Negative for confusion and dysphoric mood.        Objective:   Physical Exam Constitutional:      General: She is not in acute distress.    Appearance: Normal appearance. She is well-developed and normal weight. She is not ill-appearing, toxic-appearing or diaphoretic.  HENT:     Head: Normocephalic and atraumatic.     Comments: Nares are injected and congested      Right Ear: Tympanic membrane, ear canal and external ear normal.     Left Ear:  Tympanic membrane, ear canal and external ear normal.     Nose: Congestion and rhinorrhea present.     Mouth/Throat:     Mouth: Mucous membranes are moist.     Pharynx: Oropharynx is clear. No oropharyngeal exudate or posterior oropharyngeal erythema.     Comments: Clear pnd  Eyes:     General:        Right eye: No discharge.        Left eye: No discharge.  Conjunctiva/sclera: Conjunctivae normal.     Pupils: Pupils are equal, round, and reactive to light.  Cardiovascular:     Rate and Rhythm: Normal rate.     Heart sounds: Normal heart sounds.  Pulmonary:     Effort: Pulmonary effort is normal. No respiratory distress.     Breath sounds: Normal breath sounds. No stridor. No wheezing, rhonchi or rales.     Comments: Some upper airway sounds  Cough sounds bronchial  No wheezing  Chest:     Chest wall: No tenderness.  Musculoskeletal:     Cervical back: Normal range of motion and neck supple.  Lymphadenopathy:     Cervical: No cervical adenopathy.  Skin:    General: Skin is warm and dry.     Capillary Refill: Capillary refill takes less than 2 seconds.     Findings: No rash.  Neurological:     Mental Status: She is alert.     Cranial Nerves: No cranial nerve deficit.  Psychiatric:        Mood and Affect: Mood normal.           Assessment & Plan:   Problem List Items Addressed This Visit       Respiratory   Viral URI with cough - Primary    This is new 2-3 d with neg covid test Unsure /doubt related to her recent interstitial change on cxr   (of note has felt more tired in general however)  Reassuring exam Symptoms care reviewed-see AVS Watch for wheeze/tight breathing or fever  CT scan planned this week of chest also ER precautions rev in detail Update if not starting to improve in a week or if worsening          Other   Abnormal chest x-ray    Interstitial changes continued even after abx tx for poss interstitial pna   Now has new uri symptoms= ?  If related  Will get CT this week as planned         Other Visit Diagnoses     Cough, unspecified type       Relevant Orders   POC COVID-19 (Completed)

## 2022-08-25 NOTE — Patient Instructions (Addendum)
Drink fluids and rest  mucinex DM or robitussin DM  is good for cough and congestion  Tessalon px may help cough as well  Nasal saline for congestion as needed  A steroid nasal spray helps also (in season for allergies)  Tylenol for fever or pain or headache  Please alert [place] if symptoms worsen (if severe or short of breath please go to the ER)    If you wheeze more or get tight- let us know and we can try some prednisone  Watch for fever or shortness of breath   Get your CT scan as planned

## 2022-08-27 ENCOUNTER — Ambulatory Visit
Admission: RE | Admit: 2022-08-27 | Discharge: 2022-08-27 | Disposition: A | Discharge status: Home / Self Care | Payer: Medicare PPO | Source: Ambulatory Visit | Attending: Family Medicine | Admitting: Family Medicine

## 2022-08-27 ENCOUNTER — Ambulatory Visit: Payer: Medicare PPO | Admitting: Physician Assistant

## 2022-08-27 DIAGNOSIS — R9389 Abnormal findings on diagnostic imaging of other specified body structures: Secondary | ICD-10-CM

## 2022-08-27 DIAGNOSIS — R590 Localized enlarged lymph nodes: Secondary | ICD-10-CM | POA: Diagnosis not present

## 2022-08-27 DIAGNOSIS — R918 Other nonspecific abnormal finding of lung field: Secondary | ICD-10-CM | POA: Diagnosis not present

## 2022-08-29 ENCOUNTER — Encounter: Payer: Medicare PPO | Admitting: Internal Medicine

## 2022-09-04 ENCOUNTER — Telehealth: Payer: Self-pay | Admitting: Family Medicine

## 2022-09-04 MED ORDER — PROMETHAZINE-DM 6.25-15 MG/5ML PO SYRP: 5.0000 mL | ORAL_SOLUTION | Freq: Three times a day (TID) | ORAL | 0 refills | Status: DC | PRN

## 2022-09-04 NOTE — Telephone Encounter (Signed)
Pt said she doesn't have any new sxs, no fever, no wheezing and no productive cough, she has the same cough that PCP evaluated already pt said it's just not getting any better. Pt said her throat is scratchy but it's from all the coughing she has been doing.

## 2022-09-04 NOTE — Telephone Encounter (Signed)
Patient was advised to call in if the lingering cough that she has been having did not approve and some cough medicine would be called in for her.She called in today to have something sent in.  [id number] [place], [place] - [address] AT [facility] OF [address] & [address] Phone: [phone]  Fax: [phone]

## 2022-09-04 NOTE — Telephone Encounter (Signed)
Sent prometh dm  Caution of sedation   Productive cough? Any wheeze? Any fever?  Thanks

## 2022-09-05 ENCOUNTER — Encounter: Payer: Self-pay | Admitting: Primary Care

## 2022-09-05 ENCOUNTER — Ambulatory Visit (INDEPENDENT_AMBULATORY_CARE_PROVIDER_SITE_OTHER): Payer: Medicare PPO | Admitting: Primary Care

## 2022-09-05 ENCOUNTER — Telehealth: Payer: Self-pay | Admitting: Family Medicine

## 2022-09-05 VITALS — BP 134/78 | HR 70 | Temp 98.2°F | Ht 63.0 in | Wt 164.0 lb

## 2022-09-05 DIAGNOSIS — R053 Chronic cough: Secondary | ICD-10-CM | POA: Diagnosis not present

## 2022-09-05 LAB — POCT RAPID STREP A (OFFICE): Rapid Strep A Screen: NEGATIVE

## 2022-09-05 NOTE — Telephone Encounter (Signed)
Patient called our office stating she has had a cough ongoing for a month. Stated she was recently seen BY Dr. Milinda Antis and given covid test, states she still has a cough/ red sore throat and would like to do a strep test. Wants to know if it is something Dr. Milinda Antis would recommend doing, would like a call back whenever possible. Please advise [phone].

## 2022-09-05 NOTE — Telephone Encounter (Signed)
Sched same day w/ Mayra Reel.

## 2022-09-05 NOTE — Assessment & Plan Note (Signed)
Reviewed office notes from PCP and CT scan from April 2024.  Exam today without alarm signs.  Suspect she could be experiencing a post viral cough with reflux/allergy cough.  Resume Zyrtec 10 mg daily. Resume famotidine 20 mg daily.  Rapid strep negative.  She will update.

## 2022-09-05 NOTE — Telephone Encounter (Signed)
Thanks for letting me know  As of yesterday she said cough is not productive and no wheezing or fever The ST is new  Please schedule with first available

## 2022-09-05 NOTE — Patient Instructions (Signed)
Resume your Zyrtec and Pepcid daily as discussed.  It was a pleasure meeting you!

## 2022-09-05 NOTE — Progress Notes (Signed)
Subjective:    Patient ID: Tracy Blankenship, female    DOB: Mar 08, 1956, 67 y.o.   MRN: [id number]  Sore Throat  Associated symptoms include coughing. Pertinent negatives include no shortness of breath.    Tracy Blankenship is a very pleasant 67 y.o. female with a history of allergic rhinitis, interstitial cystitis, urinary incontinence, hyperlipidemia who presents today to discuss cough.  Evaluated by PCP on 08/25/22 for ongoing headaches, irritated throat, sinus pressure, rhinorrhea, nasal congestion, non productive cough. Covid-19 test was negative. She was diagnosed with viral URI.   She underwent CT chest on 08/27/22 which was negative for Insterstitial lung disease or pneumonia. She had mild bibasilar atelectasis.   Today she continues to experience a dry cough and irritated throat. She denies rhinorrhea, nasal congestion, esophageal burning, belching, post nasal drip, fevers, chills, body aches.   She is not taking an antihistamine or her famotine. She thinks she may have strep. She would like reassurance that she can be around family this weekend.   Review of Systems  Constitutional:  Negative for chills, fatigue and fever.  HENT:  Positive for sore throat.   Respiratory:  Positive for cough. Negative for shortness of breath.          Past Medical History:  Diagnosis Date   Allergy    Chronic sinusitis    Constipation    Hyperglycemia    IC (interstitial cystitis)    Plantar fasciitis    Pneumonia    Stress reaction    Trigger finger     Social History   Socioeconomic History   Marital status: Married    Spouse name: Not on file   Number of children: 2   Years of education: Not on file   Highest education level: Not on file  Occupational History   Occupation: TEACHER    Employer: [employer]  Tobacco Use   Smoking status: Never   Smokeless tobacco: Never  Vaping Use   Vaping Use: Never used  Substance and Sexual Activity   Alcohol use: Yes     Alcohol/week: 0.0 standard drinks of alcohol    Comment: wine -every 3-4 months   Drug use: No   Sexual activity: Not on file  Other Topics Concern   Not on file  Social History Narrative   Not on file   Social Determinants of Health   Financial Resource Strain: Low Risk  (07/31/2022)   Overall Financial Resource Strain (CARDIA)    Difficulty of Paying Living Expenses: Not hard at all  Food Insecurity: No Food Insecurity (07/31/2022)   Hunger Vital Sign    Worried About Running Out of Food in the Last Year: Never true    Ran Out of Food in the Last Year: Never true  Transportation Needs: No Transportation Needs (07/31/2022)   PRAPARE - Administrator, Civil Service (Medical): No    Lack of Transportation (Non-Medical): No  Physical Activity: Inactive (07/31/2022)   Exercise Vital Sign    Days of Exercise per Week: 0 days    Minutes of Exercise per Session: 0 min  Stress: No Stress Concern Present (07/31/2022)   [employer] of Occupational Health - Occupational Stress Questionnaire    Feeling of Stress : Not at all  Social Connections: Moderately Isolated (07/31/2022)   Social Connection and Isolation Panel [NHANES]    Frequency of Communication with Friends and Family: More than three times a week    Frequency of Social Gatherings  with Friends and Family: More than three times a week    Attends Religious Services: Never    Active Member of Clubs or Organizations: No    Attends Banker Meetings: Never    Marital Status: Married  Catering manager Violence: Not At Risk (07/31/2022)   Humiliation, Afraid, Rape, and Kick questionnaire    Fear of Current or Ex-Partner: No    Emotionally Abused: No    Physically Abused: No    Sexually Abused: No    Past Surgical History:  Procedure Laterality Date   APPENDECTOMY     CESAREAN SECTION     X 2   WISDOM TOOTH EXTRACTION      Family History  Problem Relation Age of Onset   Cardiomyopathy Father    Diabetes  Father    Stroke Father    Anxiety disorder Mother    Lupus Mother    Emphysema Mother    Coronary artery disease Mother    Lung cancer Mother    Hypertension Mother    Heart disease Mother    COPD Mother    Kidney disease Mother    Colon cancer Neg Hx    Breast cancer Neg Hx    Colon polyps Neg Hx    Esophageal cancer Neg Hx    Rectal cancer Neg Hx    Stomach cancer Neg Hx     Allergies  Allergen Reactions   Claritin [Loratadine] Other (See Comments)    Felt jittery   Penicillins     REACTION: ? allergy   Pseudoephedrine     REACTION: makes her hyper    Current Outpatient Medications on File Prior to Visit  Medication Sig Dispense Refill   Calcium Carbonate-Vit D-Min (CALCIUM 1200) 1200-1000 MG-UNIT CHEW Chew by mouth.     Cetirizine HCl 10 MG CAPS Take 1 capsule by mouth daily as needed.     famotidine (PEPCID) 20 MG tablet Take 1 tablet (20 mg total) by mouth 2 (two) times daily as needed for heartburn or indigestion. 60 tablet 11   Multiple Vitamin (MULTIVITAMIN) tablet Take 1 tablet by mouth daily.     polyethylene glycol powder (MIRALAX) 17 GM/SCOOP powder Take 1 Container by mouth daily as needed.     promethazine-dextromethorphan (PROMETHAZINE-DM) 6.25-15 MG/5ML syrup Take 5 mLs by mouth 3 (three) times daily as needed for cough. Caution of sedation 118 mL 0   Triamcinolone Acetonide (NASACORT ALLERGY 24HR NA) Place 2 sprays into the nose daily.     benzonatate (TESSALON) 200 MG capsule Take 1 capsule (200 mg total) by mouth 3 (three) times daily as needed for cough. Swallow whole (Patient not taking: Reported on 09/05/2022) 30 capsule 1   No current facility-administered medications on file prior to visit.    BP 134/78   Pulse 70   Temp 98.2 F (36.8 C) (Temporal)   Ht  (1.6 m)   Wt 164 lb (74.4 kg)   SpO2 99%   BMI 29.05 kg/m  Objective:   Physical Exam Constitutional:      Appearance: She is not ill-appearing.  HENT:     Right Ear: Tympanic  membrane and ear canal normal.     Left Ear: Tympanic membrane and ear canal normal.     Nose:     Right Sinus: No maxillary sinus tenderness or frontal sinus tenderness.     Left Sinus: No maxillary sinus tenderness or frontal sinus tenderness.     Mouth/Throat:  Pharynx: No posterior oropharyngeal erythema.  Eyes:     Conjunctiva/sclera: Conjunctivae normal.  Cardiovascular:     Rate and Rhythm: Normal rate and regular rhythm.  Pulmonary:     Effort: Pulmonary effort is normal.     Breath sounds: Normal breath sounds. No wheezing or rales.  Musculoskeletal:     Cervical back: Neck supple.  Lymphadenopathy:     Cervical: No cervical adenopathy.  Skin:    General: Skin is warm and dry.           Assessment & Plan:  Persistent cough for 3 weeks or longer Assessment & Plan: Reviewed office notes from PCP and CT scan from April 2024.  Exam today without alarm signs.  Suspect she could be experiencing a post viral cough with reflux/allergy cough.  Resume Zyrtec 10 mg daily. Resume famotidine 20 mg daily.  Rapid strep negative.  She will update.   Orders: -     POCT rapid strep A        Doreene Nest, NP

## 2022-09-25 ENCOUNTER — Ambulatory Visit (AMBULATORY_SURGERY_CENTER): Payer: Medicare PPO | Admitting: Internal Medicine

## 2022-09-25 ENCOUNTER — Encounter: Payer: Self-pay | Admitting: Internal Medicine

## 2022-09-25 VITALS — BP 126/60 | HR 62 | Temp 98.6°F | Resp 10 | Ht 63.0 in | Wt 165.0 lb

## 2022-09-25 DIAGNOSIS — Z860101 Personal history of adenomatous and serrated colon polyps: Secondary | ICD-10-CM

## 2022-09-25 DIAGNOSIS — E669 Obesity, unspecified: Secondary | ICD-10-CM | POA: Diagnosis not present

## 2022-09-25 DIAGNOSIS — Z09 Encounter for follow-up examination after completed treatment for conditions other than malignant neoplasm: Secondary | ICD-10-CM | POA: Diagnosis not present

## 2022-09-25 DIAGNOSIS — D123 Benign neoplasm of transverse colon: Secondary | ICD-10-CM | POA: Diagnosis not present

## 2022-09-25 DIAGNOSIS — D120 Benign neoplasm of cecum: Secondary | ICD-10-CM | POA: Diagnosis not present

## 2022-09-25 DIAGNOSIS — Z86010 Personal history of colonic polyps: Secondary | ICD-10-CM

## 2022-09-25 MED ORDER — SODIUM CHLORIDE 0.9 % IV SOLN
500.0000 mL | INTRAVENOUS | Status: DC
Start: 2022-09-25 — End: 2022-09-25

## 2022-09-25 NOTE — Progress Notes (Signed)
Report to PACU, RN, vss, BBS= Clear.  

## 2022-09-25 NOTE — Progress Notes (Signed)
Called to room to assist during endoscopic procedure.  Patient ID and intended procedure confirmed with present staff. Received instructions for my participation in the procedure from the performing physician.  

## 2022-09-25 NOTE — Progress Notes (Signed)
GASTROENTEROLOGY PROCEDURE H&P NOTE   Primary Care Physician: Tower, Audrie Gallus, MD    Reason for Procedure:  History of adenomatous colon polyps  Plan:    Colonoscopy  Patient is appropriate for endoscopic procedure(s) in the ambulatory ([facility]) setting.  The nature of the procedure, as well as the risks, benefits, and alternatives were carefully and thoroughly reviewed with the patient. Ample time for discussion and questions allowed. The patient understood, was satisfied, and agreed to proceed.     HPI: Tracy Blankenship is a 67 y.o. female who presents for surveillance colonoscopy.  Medical history as below.  Tolerated the prep.  No recent chest pain or shortness of breath.  No abdominal pain today.  Past Medical History:  Diagnosis Date   Allergy    Chronic sinusitis    Constipation    Hyperglycemia    IC (interstitial cystitis)    Plantar fasciitis    Pneumonia    Stress reaction    Trigger finger     Past Surgical History:  Procedure Laterality Date   APPENDECTOMY     CESAREAN SECTION     X 2   WISDOM TOOTH EXTRACTION      Prior to Admission medications   Medication Sig Start Date End Date Taking? Authorizing Provider  Cetirizine HCl 10 MG CAPS Take 1 capsule by mouth daily as needed.   Yes [provider]  benzonatate (TESSALON) 200 MG capsule Take 1 capsule (200 mg total) by mouth 3 (three) times daily as needed for cough. Swallow whole Patient not taking: Reported on 09/05/2022 08/25/22   Tower, Audrie Gallus, MD  Calcium Carbonate-Vit D-Min (CALCIUM 1200) 1200-1000 MG-UNIT CHEW Chew by mouth.    [provider]  famotidine (PEPCID) 20 MG tablet Take 1 tablet (20 mg total) by mouth 2 (two) times daily as needed for heartburn or indigestion. 07/29/21   Tower, Audrie Gallus, MD  Multiple Vitamin (MULTIVITAMIN) tablet Take 1 tablet by mouth daily.    [provider]  polyethylene glycol powder (MIRALAX) 17 GM/SCOOP powder Take 1 Container by mouth daily  as needed.    [provider]  promethazine-dextromethorphan (PROMETHAZINE-DM) 6.25-15 MG/5ML syrup Take 5 mLs by mouth 3 (three) times daily as needed for cough. Caution of sedation 09/04/22   Tower, Audrie Gallus, MD  Triamcinolone Acetonide (NASACORT ALLERGY 24HR NA) Place 2 sprays into the nose daily.    [provider]    Current Outpatient Medications  Medication Sig Dispense Refill   Cetirizine HCl 10 MG CAPS Take 1 capsule by mouth daily as needed.     benzonatate (TESSALON) 200 MG capsule Take 1 capsule (200 mg total) by mouth 3 (three) times daily as needed for cough. Swallow whole (Patient not taking: Reported on 09/05/2022) 30 capsule 1   Calcium Carbonate-Vit D-Min (CALCIUM 1200) 1200-1000 MG-UNIT CHEW Chew by mouth.     famotidine (PEPCID) 20 MG tablet Take 1 tablet (20 mg total) by mouth 2 (two) times daily as needed for heartburn or indigestion. 60 tablet 11   Multiple Vitamin (MULTIVITAMIN) tablet Take 1 tablet by mouth daily.     polyethylene glycol powder (MIRALAX) 17 GM/SCOOP powder Take 1 Container by mouth daily as needed.     promethazine-dextromethorphan (PROMETHAZINE-DM) 6.25-15 MG/5ML syrup Take 5 mLs by mouth 3 (three) times daily as needed for cough. Caution of sedation 118 mL 0   Triamcinolone Acetonide (NASACORT ALLERGY 24HR NA) Place 2 sprays into the nose daily.  Current Facility-Administered Medications  Medication Dose Route Frequency Provider Last Rate Last Admin   0.9 %  sodium chloride infusion  500 mL Intravenous Continuous Keshia Weare, Carie Caddy, MD        Allergies as of 09/25/2022 - Review Complete 09/25/2022  Allergen Reaction Noted   Claritin [loratadine] Other (See Comments) 07/10/2017   Penicillins  08/18/2008   Pseudoephedrine  08/18/2008    Family History  Problem Relation Age of Onset   Cardiomyopathy Father    Diabetes Father    Stroke Father    Anxiety disorder Mother    Lupus Mother    Emphysema Mother    Coronary artery  disease Mother    Lung cancer Mother    Hypertension Mother    Heart disease Mother    COPD Mother    Kidney disease Mother    Colon cancer Neg Hx    Breast cancer Neg Hx    Colon polyps Neg Hx    Esophageal cancer Neg Hx    Rectal cancer Neg Hx    Stomach cancer Neg Hx     Social History   Socioeconomic History   Marital status: Married    Spouse name: Not on file   Number of children: 2   Years of education: Not on file   Highest education level: Not on file  Occupational History   Occupation: TEACHER    Employer: [employer]  Tobacco Use   Smoking status: Never   Smokeless tobacco: Never  Vaping Use   Vaping Use: Never used  Substance and Sexual Activity   Alcohol use: Yes    Alcohol/week: 0.0 standard drinks of alcohol    Comment: wine -every 3-4 months   Drug use: No   Sexual activity: Not on file  Other Topics Concern   Not on file  Social History Narrative   Not on file   Social Determinants of Health   Financial Resource Strain: Low Risk  (07/31/2022)   Overall Financial Resource Strain (CARDIA)    Difficulty of Paying Living Expenses: Not hard at all  Food Insecurity: No Food Insecurity (07/31/2022)   Hunger Vital Sign    Worried About Running Out of Food in the Last Year: Never true    Ran Out of Food in the Last Year: Never true  Transportation Needs: No Transportation Needs (07/31/2022)   PRAPARE - Administrator, Civil Service (Medical): No    Lack of Transportation (Non-Medical): No  Physical Activity: Inactive (07/31/2022)   Exercise Vital Sign    Days of Exercise per Week: 0 days    Minutes of Exercise per Session: 0 min  Stress: No Stress Concern Present (07/31/2022)   [employer] of Occupational Health - Occupational Stress Questionnaire    Feeling of Stress : Not at all  Social Connections: Moderately Isolated (07/31/2022)   Social Connection and Isolation Panel [NHANES]    Frequency of Communication with Friends and  Family: More than three times a week    Frequency of Social Gatherings with Friends and Family: More than three times a week    Attends Religious Services: Never    Database administrator or Organizations: No    Attends Banker Meetings: Never    Marital Status: Married  Catering manager Violence: Not At Risk (07/31/2022)   Humiliation, Afraid, Rape, and Kick questionnaire    Fear of Current or Ex-Partner: No    Emotionally Abused: No    Physically Abused:  No    Sexually Abused: No    Physical Exam: Vital signs in last 24 hours: @BP  139/68   Pulse 83   Temp 98.6 F (37 C)   Ht 5\' 3"  (1.6 m)   Wt 165 lb (74.8 kg)   SpO2 96%   BMI 29.23 kg/m  GEN: NAD EYE: Sclerae anicteric ENT: MMM CV: Non-tachycardic Pulm: CTA b/l GI: Soft, NT/ND NEURO:  Alert & Oriented x 3   Erick Blinks, MD [facility]  09/25/2022 10:39 AM

## 2022-09-25 NOTE — Patient Instructions (Signed)
YOU HAD AN ENDOSCOPIC PROCEDURE TODAY AT THE [facility]:   Refer to the procedure report that was given to you for any specific questions about what was found during the examination.  If the procedure report does not answer your questions, please call your gastroenterologist to clarify.  If you requested that your care partner not be given the details of your procedure findings, then the procedure report has been included in a sealed envelope for you to review at your convenience later.  **Handouts given on polyps, hemorrhoids and diverticulosis**  YOU SHOULD EXPECT: Some feelings of bloating in the abdomen. Passage of more gas than usual.  Walking can help get rid of the air that was put into your GI tract during the procedure and reduce the bloating. If you had a lower endoscopy (such as a colonoscopy or flexible sigmoidoscopy) you may notice spotting of blood in your stool or on the toilet paper. If you underwent a bowel prep for your procedure, you may not have a normal bowel movement for a few days.  Please Note:  You might notice some irritation and congestion in your nose or some drainage.  This is from the oxygen used during your procedure.  There is no need for concern and it should clear up in a day or so.  SYMPTOMS TO REPORT IMMEDIATELY:  Following lower endoscopy (colonoscopy or flexible sigmoidoscopy):  Excessive amounts of blood in the stool  Significant tenderness or worsening of abdominal pains  Swelling of the abdomen that is new, acute  Fever of 100F or higher  For urgent or emergent issues, a gastroenterologist can be reached at any hour by calling [phone]. Do not use MyChart messaging for urgent concerns.    DIET:  We do recommend a small meal at first, but then you may proceed to your regular diet.  Drink plenty of fluids but you should avoid alcoholic beverages for 24 hours.  ACTIVITY:  You should plan to take it easy for the rest of today and you  should NOT DRIVE or use heavy machinery until tomorrow (because of the sedation medicines used during the test).    FOLLOW UP: Our staff will call the number listed on your records the next business day following your procedure.  We will call around 7:15- 8:00 am to check on you and address any questions or concerns that you may have regarding the information given to you following your procedure. If we do not reach you, we will leave a message.     If any biopsies were taken you will be contacted by phone or by letter within the next 1-3 weeks.  Please call us at [phone] if you have not heard about the biopsies in 3 weeks.    SIGNATURES/CONFIDENTIALITY: You and/or your care partner have signed paperwork which will be entered into your electronic medical record.  These signatures attest to the fact that that the information above on your After Visit Summary has been reviewed and is understood.  Full responsibility of the confidentiality of this discharge information lies with you and/or your care-partner. 

## 2022-09-25 NOTE — Op Note (Signed)
[facility] Patient Name: Tracy Blankenship Procedure Date: 09/25/2022 10:45 AM MRN: [id number] Endoscopist: Beverley Fiedler , MD, [id number] Age: 67 Referring MD:  Date of Birth: [birth year] Gender: Female Account #: [id number] Procedure:                Colonoscopy Indications:              High risk colon cancer surveillance: Personal                            history of non-advanced adenomas, Last colonoscopy:                            April 2019 (1 adenoma), 2014 (1 adenoma) Medicines:                Monitored Anesthesia Care Procedure:                Pre-Anesthesia Assessment:                           - Prior to the procedure, a History and Physical                            was performed, and patient medications and                            allergies were reviewed. The patient's tolerance of                            previous anesthesia was also reviewed. The risks                            and benefits of the procedure and the sedation                            options and risks were discussed with the patient.                            All questions were answered, and informed consent                            was obtained. Prior Anticoagulants: The patient has                            taken no anticoagulant or antiplatelet agents. ASA                            Grade Assessment: II - A patient with mild systemic                            disease. After reviewing the risks and benefits,                            the patient was deemed in satisfactory condition to  undergo the procedure.                           After obtaining informed consent, the colonoscope                            was passed under direct vision. Throughout the                            procedure, the patient's blood pressure, pulse, and                            oxygen saturations were monitored continuously. The                            [id number]-HQ190L  Colonoscope [id number] was introduced                            through the anus and advanced to the cecum,                            identified by appendiceal orifice and ileocecal                            valve. The colonoscopy was performed without                            difficulty. The patient tolerated the procedure                            well. The quality of the bowel preparation was                            good. The ileocecal valve, appendiceal orifice, and                            rectum were photographed. Scope In: 10:51:40 AM Scope Out: 11:03:07 AM Scope Withdrawal Time: 0 hours 9 minutes 31 seconds  Total Procedure Duration: 0 hours 11 minutes 27 seconds  Findings:                 The digital rectal exam was normal.                           A 6 mm polyp was found in the cecum. The polyp was                            sessile. The polyp was removed with a cold snare.                            Resection and retrieval were complete.                           A 5 mm polyp was found in the transverse colon. The  polyp was sessile. The polyp was removed with a                            cold snare. Resection and retrieval were complete.                           Multiple small-mouthed diverticula were found in                            the sigmoid colon.                           Internal hemorrhoids were found during                            retroflexion. The hemorrhoids were small. Complications:            No immediate complications. Estimated Blood Loss:     Estimated blood loss was minimal. Impression:               - One 6 mm polyp in the cecum, removed with a cold                            snare. Resected and retrieved.                           - One 5 mm polyp in the transverse colon, removed                            with a cold snare. Resected and retrieved.                           - Moderate diverticulosis in the sigmoid  colon.                           - Small internal hemorrhoids. Recommendation:           - Patient has a contact number available for                            emergencies. The signs and symptoms of potential                            delayed complications were discussed with the                            patient. Return to normal activities tomorrow.                            Written discharge instructions were provided to the                            patient.                           - Resume previous diet.                           -  Continue present medications.                           - Await pathology results.                           - Repeat colonoscopy is recommended for                            surveillance. The colonoscopy date will be                            determined after pathology results from today's                            exam become available for review. Beverley Fiedler, MD 09/25/2022 11:06:36 AM This report has been signed electronically.

## 2022-09-26 ENCOUNTER — Telehealth: Payer: Self-pay

## 2022-09-26 NOTE — Telephone Encounter (Signed)
  Follow up Call-     09/25/2022   10:17 AM  Call back number  Post procedure Call Back phone  # [phone]  Permission to leave phone message Yes     Patient questions:  Do you have a fever, pain , or abdominal swelling? No. Pain Score  0 *  Have you tolerated food without any problems? Yes.    Have you been able to return to your normal activities? Yes.    Do you have any questions about your discharge instructions: Diet   No. Medications  No. Follow up visit  No.  Do you have questions or concerns about your Care? No.  Actions: * If pain score is 4 or above: No action needed, pain <4.

## 2022-10-01 ENCOUNTER — Encounter: Payer: Self-pay | Admitting: Internal Medicine

## 2022-11-05 ENCOUNTER — Ambulatory Visit: Payer: Medicare PPO

## 2022-11-06 DIAGNOSIS — D225 Melanocytic nevi of trunk: Secondary | ICD-10-CM | POA: Diagnosis not present

## 2022-11-06 DIAGNOSIS — D485 Neoplasm of uncertain behavior of skin: Secondary | ICD-10-CM | POA: Diagnosis not present

## 2022-11-06 DIAGNOSIS — L720 Epidermal cyst: Secondary | ICD-10-CM | POA: Diagnosis not present

## 2022-11-06 DIAGNOSIS — D2261 Melanocytic nevi of right upper limb, including shoulder: Secondary | ICD-10-CM | POA: Diagnosis not present

## 2022-11-06 DIAGNOSIS — D2272 Melanocytic nevi of left lower limb, including hip: Secondary | ICD-10-CM | POA: Diagnosis not present

## 2022-11-06 DIAGNOSIS — D2262 Melanocytic nevi of left upper limb, including shoulder: Secondary | ICD-10-CM | POA: Diagnosis not present

## 2022-11-06 DIAGNOSIS — L821 Other seborrheic keratosis: Secondary | ICD-10-CM | POA: Diagnosis not present

## 2022-11-06 DIAGNOSIS — D224 Melanocytic nevi of scalp and neck: Secondary | ICD-10-CM | POA: Diagnosis not present

## 2022-11-06 DIAGNOSIS — D0471 Carcinoma in situ of skin of right lower limb, including hip: Secondary | ICD-10-CM | POA: Diagnosis not present

## 2022-11-06 DIAGNOSIS — D2239 Melanocytic nevi of other parts of face: Secondary | ICD-10-CM | POA: Diagnosis not present

## 2022-11-11 ENCOUNTER — Ambulatory Visit (INDEPENDENT_AMBULATORY_CARE_PROVIDER_SITE_OTHER): Payer: Medicare PPO

## 2022-11-11 ENCOUNTER — Telehealth: Payer: Self-pay | Admitting: Family Medicine

## 2022-11-11 DIAGNOSIS — Z23 Encounter for immunization: Secondary | ICD-10-CM

## 2022-11-11 DIAGNOSIS — H9190 Unspecified hearing loss, unspecified ear: Secondary | ICD-10-CM | POA: Insufficient documentation

## 2022-11-11 DIAGNOSIS — H9193 Unspecified hearing loss, bilateral: Secondary | ICD-10-CM

## 2022-11-11 NOTE — Telephone Encounter (Signed)
   Reason for Referral Request: Hearing loss in both ears   Has patient been seen PCP for this complaint? Yes, discussed at last cpe   No,  please schedule patient for appointment for complaint.  Patient scheduled on:   Yes, please find out following information.  Referral for which specialty: Audiology/ Hearing   Preferred office/provider: [place] area, no preferred office or provider

## 2022-11-11 NOTE — Progress Notes (Signed)
Per orders of Dr. Roxy Manns, injection of pneumococcal conjugate vaccine 20 valent given by Lewanda Rife in left deltoid. Patient tolerated injection well. Marland Kitchen

## 2022-11-11 NOTE — Telephone Encounter (Signed)
I put the referral in  Please let us know if you don't hear in 1-2 weeks   

## 2022-11-12 NOTE — Telephone Encounter (Signed)
LMTCB

## 2022-11-13 NOTE — Telephone Encounter (Signed)
Appt has been scheduled with pt.

## 2022-11-24 ENCOUNTER — Ambulatory Visit: Payer: Medicare PPO | Attending: Family Medicine | Admitting: Audiologist

## 2022-11-24 DIAGNOSIS — H903 Sensorineural hearing loss, bilateral: Secondary | ICD-10-CM | POA: Diagnosis not present

## 2022-11-24 NOTE — Procedures (Signed)
  Outpatient Audiology and [facility] [address] [place], [place]  [address] [phone]  AUDIOLOGICAL  EVALUATION  NAME: Tracy Blankenship     DOB:   15-Jul-1955      MRN: [id number]                                                                                     DATE: 11/24/2022     REFERENT: Judy Pimple, MD STATUS: Outpatient DIAGNOSIS: Sensorineural Hearing Loss Bilateral   History: [name] was seen for an audiological evaluation. [name] is receiving a hearing evaluation due to concerns for hearing . [name] has difficulty hearing in the garage when her husband has the radio playing. This difficulty began gradually. No pain or pressure reported in either ear. Tinnitus denies for both ears..  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 No other relevant case history reported.   Evaluation:  Otoscopy showed a clear view of the tympanic membranes, bilaterally Tympanometry results were consistent with normal middle ear function, bilaterally   Audiometric testing was completed using conventional audiometry with supraural transducer. Speech Recognition Thresholds were  35dB in the right ear and 30dB in the left ear. Word Recognition was performed  40dB SL, scored  100% in the right ear and 100% in the left ear. Pure tone thresholds show normal sloping to moderate sensorineural hearing loss in both ears.   Results:  The test results were reviewed with [name]. Nature and degree of hearing loss explained. Two copies of test provided to [facility]. She has normal sloping to moderate age related hearing loss. She cannot hear the highest pitch speech sounds. She needs to be face to face to hear people clearly. Recommend annual hearing testing to monitor progression, hearing aids are likely going to be necessary in the next few years.    Recommendations: 1.   Annual audiometric testing recommended to monitor loss progression   32 minutes spent testing and  counseling on results.   [name]  Audiologist, Au.D., CCC-A 11/24/2022  10:54 AM  Cc: Tower, Audrie Gallus, MD

## 2023-01-12 ENCOUNTER — Other Ambulatory Visit: Payer: Self-pay | Admitting: Family Medicine

## 2023-01-12 DIAGNOSIS — Z1231 Encounter for screening mammogram for malignant neoplasm of breast: Secondary | ICD-10-CM

## 2023-02-17 ENCOUNTER — Ambulatory Visit
Admission: RE | Admit: 2023-02-17 | Discharge: 2023-02-17 | Disposition: A | Discharge status: Home / Self Care | Payer: Medicare PPO | Source: Ambulatory Visit | Attending: Family Medicine | Admitting: Family Medicine

## 2023-02-17 DIAGNOSIS — Z1231 Encounter for screening mammogram for malignant neoplasm of breast: Secondary | ICD-10-CM | POA: Diagnosis not present

## 2023-05-11 ENCOUNTER — Ambulatory Visit: Payer: Medicare PPO | Admitting: Nurse Practitioner

## 2023-05-11 VITALS — BP 140/88 | HR 84 | Temp 98.3°F | Ht 63.0 in | Wt 162.8 lb

## 2023-05-11 DIAGNOSIS — R42 Dizziness and giddiness: Secondary | ICD-10-CM | POA: Diagnosis not present

## 2023-05-11 DIAGNOSIS — R531 Weakness: Secondary | ICD-10-CM | POA: Insufficient documentation

## 2023-05-11 DIAGNOSIS — Z8249 Family history of ischemic heart disease and other diseases of the circulatory system: Secondary | ICD-10-CM | POA: Diagnosis not present

## 2023-05-11 LAB — CBC
HCT: 44.9 % (ref 36.0–46.0)
Hemoglobin: 15 g/dL (ref 12.0–15.0)
MCHC: 33.3 g/dL (ref 30.0–36.0)
MCV: 87.2 fL (ref 78.0–100.0)
Platelets: 308 10*3/uL (ref 150.0–400.0)
RBC: 5.15 Mil/uL — ABNORMAL HIGH (ref 3.87–5.11)
RDW: 13.7 % (ref 11.5–15.5)
WBC: 4.8 10*3/uL (ref 4.0–10.5)

## 2023-05-11 LAB — COMPREHENSIVE METABOLIC PANEL
ALT: 14 U/L (ref 0–35)
AST: 17 U/L (ref 0–37)
Albumin: 4.4 g/dL (ref 3.5–5.2)
Alkaline Phosphatase: 90 U/L (ref 39–117)
BUN: 17 mg/dL (ref 6–23)
CO2: 30 meq/L (ref 19–32)
Calcium: 9.9 mg/dL (ref 8.4–10.5)
Chloride: 102 meq/L (ref 96–112)
Creatinine, Ser: 0.84 mg/dL (ref 0.40–1.20)
GFR: 71.58 mL/min (ref >60.00)
Glucose, Bld: 87 mg/dL (ref 70–99)
Potassium: 4.3 meq/L (ref 3.5–5.1)
Sodium: 140 meq/L (ref 135–145)
Total Bilirubin: 0.5 mg/dL (ref 0.2–1.2)
Total Protein: 7.2 g/dL (ref 6.0–8.3)

## 2023-05-11 LAB — TSH: TSH: 0.8 u[IU]/mL (ref 0.35–5.50)

## 2023-05-11 NOTE — Progress Notes (Signed)
Acute Office Visit  Subjective:     Patient ID: Tracy Blankenship, female    DOB: 07-24-1955, 67 y.o.   MRN: [id number]  Chief Complaint  Patient presents with   Leg Pain    Pt complains of sharp pain in upper left leg. Lasts a few seconds. Started a week ago.    Hypertension    Pt complains of feeling dizzy and weak.      Patient is in today for multiple complaints with a history of hearing loss, eczema, elevated blood pressure reading, HLD, prediabetes  Leg pain: states that it started papprox 1 week ago out of the blue. States that it is not with moving or exercise. States that she was standing  at the sink and it hit. States that she can be laying or sitting and it will happen. States that it is a sharp, short lived and repeative pain. States that over the past few months both of her legs feel "weird" states that she will get some aches and that she will need heating pad. States that she has not tried anyting for the pain   Dizzy: states that it is more of a lightheadeness and anxious. States that she is having some life stressors with her family Hulda Humphrey that she does not know if she has "made herself nuts". States that the feeling will come and she will "feel off". States that it does not correlate to positions changes. She does have a history of vertigo and has been [place] [facility] but this is different.  States that she will do some water and ginger ale,   Review of Systems  Constitutional:  Negative for chills and fever.  Respiratory:  Negative for shortness of breath.   Cardiovascular:  Negative for chest pain.  Gastrointestinal:  Positive for constipation.  Neurological:  Positive for dizziness. Negative for tingling, weakness and headaches.  Psychiatric/Behavioral:  Negative for hallucinations and suicidal ideas.         Objective:    BP (!) 140/88   Pulse 84   Temp 98.3 F (36.8 C) (Oral)   Ht 5\' 3"  (1.6 m)   Wt 162 lb 12.8 oz (73.8 kg)   SpO2 98%   BMI 28.84 kg/m   BP Readings from Last 3 Encounters:  05/11/23 (!) 140/88  09/25/22 126/60  09/05/22 134/78   Wt Readings from Last 3 Encounters:  05/11/23 162 lb 12.8 oz (73.8 kg)  09/25/22 165 lb (74.8 kg)  09/05/22 164 lb (74.4 kg)   SpO2 Readings from Last 3 Encounters:  05/11/23 98%  09/25/22 98%  09/05/22 99%      Physical Exam Vitals and nursing note reviewed.  Constitutional:      Appearance: Normal appearance.  HENT:     Right Ear: Tympanic membrane, ear canal and external ear normal.     Left Ear: Tympanic membrane, ear canal and external ear normal.     Mouth/Throat:     Mouth: Mucous membranes are moist.     Pharynx: Oropharynx is clear.  Eyes:     Extraocular Movements: Extraocular movements intact.     Pupils: Pupils are equal, round, and reactive to light.  Neck:     Vascular: No carotid bruit.  Cardiovascular:     Rate and Rhythm: Normal rate and regular rhythm.     Pulses:          Radial pulses are 2+ on the right side and 2+ on the left side.  Posterior tibial pulses are 1+ on the right side and 1+ on the left side.     Heart sounds: Normal heart sounds.  Pulmonary:     Effort: Pulmonary effort is normal.     Breath sounds: Normal breath sounds.  Musculoskeletal:        General: No tenderness.       Legs:     Comments: Full AROM and PROM on the LLE  Lymphadenopathy:     Cervical: No cervical adenopathy.  Neurological:     Mental Status: She is alert.     Comments: Lateral upper and lower extremity strength 5/5     No results found for any visits on 05/11/23.      Assessment & Plan:   Problem List Items Addressed This Visit       Other   Lightheadedness - Primary   Ambiguous nature.  Patient does have history of vertigo but this is not the same.  Not related to position changes.  Patient is under some stress in the family issues as of late.  EKG within normal limits pending basic labs encourage patient to hydrate adequately      Relevant  Orders   EKG 12-Lead (Completed)   CBC   Comprehensive metabolic panel   TSH   Weakness   Neurological exam benign in office.  Will check basic labs encouraged adequate hydration      Relevant Orders   EKG 12-Lead (Completed)   CBC   Comprehensive metabolic panel   TSH   Family history of heart disease   Relevant Orders   EKG 12-Lead (Completed)    No orders of the defined types were placed in this encounter.   Return if symptoms worsen or fail to improve.  Audria Nine, NP

## 2023-05-11 NOTE — Assessment & Plan Note (Signed)
Neurological exam benign in office.  Will check basic labs encouraged adequate hydration

## 2023-05-11 NOTE — Patient Instructions (Signed)
Nice to see you today The EKG looked good in office Work on increasing your fluid intake Follow up if you do not improve with Dr. Milinda Antis

## 2023-05-11 NOTE — Assessment & Plan Note (Signed)
Ambiguous nature.  Patient does have history of vertigo but this is not the same.  Not related to position changes.  Patient is under some stress in the family issues as of late.  EKG within normal limits pending basic labs encourage patient to hydrate adequately

## 2023-05-13 DIAGNOSIS — L430 Hypertrophic lichen planus: Secondary | ICD-10-CM | POA: Diagnosis not present

## 2023-05-13 DIAGNOSIS — D485 Neoplasm of uncertain behavior of skin: Secondary | ICD-10-CM | POA: Diagnosis not present

## 2023-05-13 DIAGNOSIS — L820 Inflamed seborrheic keratosis: Secondary | ICD-10-CM | POA: Diagnosis not present

## 2023-05-13 DIAGNOSIS — Z85828 Personal history of other malignant neoplasm of skin: Secondary | ICD-10-CM | POA: Diagnosis not present

## 2023-05-27 DIAGNOSIS — Z6828 Body mass index (BMI) 28.0-28.9, adult: Secondary | ICD-10-CM | POA: Diagnosis not present

## 2023-05-27 DIAGNOSIS — H10022 Other mucopurulent conjunctivitis, left eye: Secondary | ICD-10-CM | POA: Diagnosis not present

## 2023-05-29 ENCOUNTER — Encounter (HOSPITAL_BASED_OUTPATIENT_CLINIC_OR_DEPARTMENT_OTHER): Payer: Self-pay | Admitting: Family Medicine

## 2023-05-29 ENCOUNTER — Ambulatory Visit (HOSPITAL_BASED_OUTPATIENT_CLINIC_OR_DEPARTMENT_OTHER): Payer: Medicare PPO | Admitting: Family Medicine

## 2023-05-29 ENCOUNTER — Ambulatory Visit: Payer: Self-pay | Admitting: Family Medicine

## 2023-05-29 ENCOUNTER — Other Ambulatory Visit (HOSPITAL_BASED_OUTPATIENT_CLINIC_OR_DEPARTMENT_OTHER): Payer: Self-pay

## 2023-05-29 VITALS — BP 165/71 | HR 105 | Ht 63.0 in | Wt 163.1 lb

## 2023-05-29 DIAGNOSIS — B9689 Other specified bacterial agents as the cause of diseases classified elsewhere: Secondary | ICD-10-CM | POA: Diagnosis not present

## 2023-05-29 DIAGNOSIS — H109 Unspecified conjunctivitis: Secondary | ICD-10-CM | POA: Insufficient documentation

## 2023-05-29 MED ORDER — OFLOXACIN 0.3 % OP SOLN
1.0000 [drp] | Freq: Four times a day (QID) | OPHTHALMIC | 0 refills | Status: DC
Start: 2023-05-29 — End: 2023-08-06
  Filled 2023-05-29: qty 5, 12d supply, fill #0

## 2023-05-29 NOTE — Telephone Encounter (Signed)
 Aware, will watch for correspondence

## 2023-05-29 NOTE — Telephone Encounter (Signed)
  Chief Complaint: pink eye Symptoms: eyes crusted shut, weepy Frequency: constant  Disposition: [] ED /[] Urgent Care (no appt availability in office) / [x] Appointment(In office/virtual)/ []  [facility]/ [] Home Care/ [] Refused Recommended Disposition /[] [facility]/ []  Follow-up with PCP Additional Notes: Pt complaining of bilateral pink eye/translucent discharge/red sclera, and discomfort. Pt [facility] and was told she had pink eye in left eye. Polymyxin B prescribed. Pt asked if she could put in right to prevent spread and was told not to. Now it's in both eyes and left is not getting better per pt. Pt is having visual changes due to not being able to open eye fully. Pt seeing provider at [facility] location at 1050 this morning. Pt quickly got off phone to make appt. Not all care advice could be given.          Copied from CRM [id number]. Topic: Clinical - Pink Word Triage >> May 29, 2023 10:18 AM Pinkey ORN wrote: Reason for Triage: Pink Eye Infection >> May 29, 2023 10:20 AM Pinkey ORN wrote: Patient states pink eye started on Wednesday, used the eye drops but the infection has gotten worse.  Reason for Disposition  MODERATE eye pain (e.g., interferes with normal activities)  Answer Assessment - Initial Assessment Questions 1. EYE DISCHARGE: Is the discharge in one or both eyes? What color is it? How much is there? When did the discharge start?      Both, translucent in color 2. REDNESS OF SCLERA: Is the redness in one or both eyes? When did the redness start?      Both red 3. EYELIDS: Are the eyelids red or swollen? If Yes, ask: How much?      no 4. VISION: Is there any difficulty seeing clearly?      Yes, can't open eye all the way 5. PAIN: Is there any pain? If Yes, ask: How bad is it? (Scale 1-10; or mild, moderate, severe)    - MILD (1-3): doesn't interfere with normal activities     - MODERATE (4-7):  interferes with normal activities or awakens from sleep    - SEVERE (8-10): excruciating pain, unable to do any normal activities       discomfort 6. CONTACT LENS: Do you wear contacts?     no 7. OTHER SYMPTOMS: Do you have any other symptoms? (e.g., fever, runny nose, cough)    Sinus congestion  Protocols used: Eye - Pus or Discharge-A-AH

## 2023-05-29 NOTE — Progress Notes (Signed)
   Acute Office Visit  Subjective:     Patient ID: EMMETT ARNTZ, female    DOB: [birth year], 68 y.o.   MRN: [id number]  Chief Complaint  Patient presents with   Conjunctivitis    Both eyes, eyes burn, have been matted shut. Initially started on Tuesday   TASHIKA GOODIN is a 68 year old female patient who presents today for an acute visit with complaint of bilateral eye pain and drainage. Reports her eyes were matted shut this morning- has been using warm washcloth. Symptoms have been present since Tuesday, started in her L eye. She was seen at urgent care on Wednesday and given Polytrim with no improvement and infection spread to the R eye. Now both eyes are experiencing burning sensation, having yellow crusted drainage, and pain.  Denies blurred vision or changes to vision.  Treatments tried include: Polytrim with no improvement   Sick contacts:  husband had URI recently   ROS: see HPI    Objective:    BP (!) 165/71   Pulse (!) 105   Ht 5' 3 (1.6 m)   Wt 163 lb 1.6 oz (74 kg)   SpO2 97%   BMI 28.89 kg/m   Physical Exam Vitals reviewed.  Constitutional:      Appearance: Normal appearance.  HENT:     Right Ear: Tympanic membrane, ear canal and external ear normal.     Left Ear: Tympanic membrane, ear canal and external ear normal.  Eyes:     Conjunctiva/sclera:     Right eye: Right conjunctiva is injected. Chemosis and exudate present. No hemorrhage.    Left eye: Left conjunctiva is injected. Chemosis and exudate present. No hemorrhage. Cardiovascular:     Rate and Rhythm: Normal rate and regular rhythm.     Pulses: Normal pulses.     Heart sounds: Normal heart sounds.  Pulmonary:     Effort: Pulmonary effort is normal.     Breath sounds: Normal breath sounds.  Neurological:     Mental Status: She is alert.  Psychiatric:        Mood and Affect: Mood normal.        Behavior: Behavior normal.    Assessment & Plan:   1. Bacterial conjunctivitis of both eyes  (Primary) SAHITHI ORDOYNE is a 68 year old female patient who presents today for an acute visit with complaint of bilateral eye pain and drainage. This started on Wednesday in her L eye and was seen by UC on Thursday. She was prescribed polytrim with no improvement in her symptoms. Now symptoms are also present in her R eye. Bilateral conjunctivae/sclerae injected with swelling of upper and lower eyelids. Exudate matted on upper eyelashes. Prescribed different antibiotic for bilateral eyes x7 days. Advised patient to continue adequate hand hygiene and warm compresses. Advised her to return to office if symptoms worsen or fail to improve.  - ofloxacin  (OCUFLOX ) 0.3 % ophthalmic solution; Place 1 drop into both eyes 4 (four) times daily.  Dispense: 5 mL; Refill: 0  Return if symptoms worsen or fail to improve.  Evalene Arts, FNP

## 2023-07-02 ENCOUNTER — Encounter: Payer: Self-pay | Admitting: Family Medicine

## 2023-07-06 NOTE — Progress Notes (Signed)
Tracy Blankenship D.[facility] [address] [place] [address] Phone: [phone]   Assessment and Plan:     1. Left hip pain 2. Greater trochanteric bursitis of left hip 3. Strain of left hamstring muscle, initial encounter  -Chronic with exacerbation, initial sports medicine visit - Most consistent with greater trochanteric bursitis with underlying greater trochanter enthesophyte leading to compensation and hamstring strain - X-ray obtained in clinic.  My interpretation: No acute fracture or dislocation.  Enthesophyte on greater trochanter - Start meloxicam 15 mg daily x2 weeks.  If still having pain after 2 weeks, complete 3rd-week of NSAID. May use remaining NSAID as needed once daily for pain control.  Do not to use additional over-the-counter NSAIDs (ibuprofen, naproxen, Advil, Aleve) while taking prescription NSAIDs.  May use Tylenol [phone] mg 2 to 3 times a day for breakthrough pain. - Start HEP for hip and hamstring  15 additional minutes spent for educating Therapeutic Home Exercise Program.  This included exercises focusing on stretching, strengthening, with focus on eccentric aspects.   Long term goals include an improvement in range of motion, strength, endurance as well as avoiding reinjury. Patient's frequency would include in 1-2 times a day, 3-5 times a week for a duration of 6-12 weeks. Proper technique shown and discussed handout in great detail with ATC.  All questions were discussed and answered.    Pertinent previous records reviewed include none  Follow Up: 4 weeks for reevaluation.  If no improvement or worsening of symptoms, could consider CSI versus physical therapy   Subjective:   I, Tracy Blankenship, am serving as a Neurosurgeon for Doctor Richardean Sale  Chief Complaint: left leg and hip pain   HPI:   07/07/2023 Patient is a 68 year old female with left leg and hip pain. Patient states that she had tightness in her  hamstrings that was intermittent. She has decreased ROM . Pain has radiated to her hip she has a sharp pain. She doesn't feel that her leg stable. She has a hard time getting up and down steps. No meds for the pain. No MOI. No numbness and tingling   Relevant Historical Information: None pertinent  Additional pertinent review of systems negative.   Current Outpatient Medications:    Calcium Carbonate-Vit D-Min (CALCIUM 1200) 1200-1000 MG-UNIT CHEW, Chew by mouth., Disp: , Rfl:    Cetirizine HCl 10 MG CAPS, Take 1 capsule by mouth daily as needed., Disp: , Rfl:    famotidine (PEPCID) 20 MG tablet, Take 1 tablet (20 mg total) by mouth 2 (two) times daily as needed for heartburn or indigestion., Disp: 60 tablet, Rfl: 11   meloxicam (MOBIC) 15 MG tablet, Take 1 tablet (15 mg total) by mouth daily., Disp: 30 tablet, Rfl: 0   Multiple Vitamin (MULTIVITAMIN) tablet, Take 1 tablet by mouth daily., Disp: , Rfl:    ofloxacin (OCUFLOX) 0.3 % ophthalmic solution, Place 1 drop into both eyes 4 (four) times daily., Disp: 5 mL, Rfl: 0   polyethylene glycol powder (MIRALAX) 17 GM/SCOOP powder, Take 1 Container by mouth daily as needed., Disp: , Rfl:    Triamcinolone Acetonide (NASACORT ALLERGY 24HR NA), Place 2 sprays into the nose daily., Disp: , Rfl:    Objective:     Vitals:   07/07/23 0914  BP: 130/68  Pulse: 82  SpO2: 98%  Weight: 165 lb (74.8 kg)  Height: 5\' 3"  (1.6 m)      Body mass index is 29.23 kg/m.  Physical Exam:    General: awake, alert, and oriented no acute distress, nontoxic Skin: no suspicious lesions or rashes Neuro:sensation intact distally with no deficits, normal muscle tone, no atrophy, strength 5/5 in all tested lower ext groups Psych: normal mood and affect, speech clear   Left hip: No deformity, swelling or wasting ROM Flexion 90, ext 20, IR 40, ER 45 TTP greater trochanter, quadriceps, proximal hamstring, gluteal musculature NTTP over the   si joint, lumbar  spine Negative log roll with FROM Negative FABER Positive FADIR for lateral hip pain Negative Piriformis test Gait normal  Posterior hip/gluteal pain with resisted hip extension.  No pain with resisted hip flexion, abduction, adduction  Electronically signed by:  Tracy Blankenship D.[facility] 9:39 AM 07/07/23

## 2023-07-07 ENCOUNTER — Ambulatory Visit: Payer: Medicare PPO | Admitting: Sports Medicine

## 2023-07-07 ENCOUNTER — Ambulatory Visit (INDEPENDENT_AMBULATORY_CARE_PROVIDER_SITE_OTHER): Payer: Medicare PPO

## 2023-07-07 VITALS — BP 130/68 | HR 82 | Ht 63.0 in | Wt 165.0 lb

## 2023-07-07 DIAGNOSIS — M25552 Pain in left hip: Secondary | ICD-10-CM

## 2023-07-07 DIAGNOSIS — S76312A Strain of muscle, fascia and tendon of the posterior muscle group at thigh level, left thigh, initial encounter: Secondary | ICD-10-CM | POA: Diagnosis not present

## 2023-07-07 DIAGNOSIS — M7062 Trochanteric bursitis, left hip: Secondary | ICD-10-CM

## 2023-07-07 DIAGNOSIS — G8929 Other chronic pain: Secondary | ICD-10-CM | POA: Diagnosis not present

## 2023-07-07 MED ORDER — MELOXICAM 15 MG PO TABS: 15.0000 mg | ORAL_TABLET | Freq: Every day | ORAL | 0 refills | Status: DC

## 2023-07-07 NOTE — Patient Instructions (Addendum)
-   Start meloxicam 15 mg daily x2 weeks.  If still having pain after 2 weeks, complete 3rd-week of NSAID. May use remaining NSAID as needed once daily for pain control.  Do not to use additional over-the-counter NSAIDs (ibuprofen, naproxen, Advil, Aleve) while taking prescription NSAIDs.  May use Tylenol [phone] mg 2 to 3 times a day for breakthrough pain. Hip HEP You have an enthesophyte on your greater trochanter aka bone spur on the outside of your hip  4 week follow up

## 2023-07-24 ENCOUNTER — Telehealth: Payer: Self-pay | Admitting: *Deleted

## 2023-07-24 NOTE — Telephone Encounter (Signed)
 Ok to schedule fasting labs before CPE. Not sure about insurance part please discuss with pt

## 2023-07-24 NOTE — Telephone Encounter (Deleted)
 Copied from CRM [id number]. Topic: Clinical - Request for Lab/Test Order >> Jul 24, 2023 10:49 AM Isabell A wrote: Reason for CRM: Patient would like to have labs completed before her physical - no orders on file.  Patient would like to speak to someone in the office in regard to confirming If her insurance will cover this.

## 2023-07-24 NOTE — Telephone Encounter (Signed)
 LVM for patient to c/b and schedule.

## 2023-07-24 NOTE — Telephone Encounter (Signed)
 Copied from CRM [id number]. Topic: Clinical - Request for Lab/Test Order >> Jul 24, 2023 10:49 AM Tracy Blankenship wrote: Reason for CRM: Patient would like to have labs completed before her physical - no orders on file.  Patient would like to speak to someone in the office in regard to confirming If her insurance will cover this.

## 2023-07-24 NOTE — Telephone Encounter (Signed)
 Patient has been scheduled

## 2023-08-03 ENCOUNTER — Telehealth (INDEPENDENT_AMBULATORY_CARE_PROVIDER_SITE_OTHER): Payer: Self-pay | Admitting: Family Medicine

## 2023-08-03 ENCOUNTER — Other Ambulatory Visit (INDEPENDENT_AMBULATORY_CARE_PROVIDER_SITE_OTHER): Payer: Medicare PPO

## 2023-08-03 DIAGNOSIS — R7303 Prediabetes: Secondary | ICD-10-CM

## 2023-08-03 DIAGNOSIS — E785 Hyperlipidemia, unspecified: Secondary | ICD-10-CM

## 2023-08-03 DIAGNOSIS — R030 Elevated blood-pressure reading, without diagnosis of hypertension: Secondary | ICD-10-CM

## 2023-08-03 LAB — CBC WITH DIFFERENTIAL/PLATELET
Basophils Absolute: 0 10*3/uL (ref 0.0–0.1)
Basophils Relative: 0.6 % (ref 0.0–3.0)
Eosinophils Absolute: 0.1 10*3/uL (ref 0.0–0.7)
Eosinophils Relative: 2.5 % (ref 0.0–5.0)
HCT: 43.8 % (ref 36.0–46.0)
Hemoglobin: 14.7 g/dL (ref 12.0–15.0)
Lymphocytes Relative: 34.7 % (ref 12.0–46.0)
Lymphs Abs: 1.8 10*3/uL (ref 0.7–4.0)
MCHC: 33.6 g/dL (ref 30.0–36.0)
MCV: 87.3 fl (ref 78.0–100.0)
Monocytes Absolute: 0.3 10*3/uL (ref 0.1–1.0)
Monocytes Relative: 6.4 % (ref 3.0–12.0)
Neutro Abs: 2.9 10*3/uL (ref 1.4–7.7)
Neutrophils Relative %: 55.8 % (ref 43.0–77.0)
Platelets: 312 10*3/uL (ref 150.0–400.0)
RBC: 5.02 Mil/uL (ref 3.87–5.11)
RDW: 13.5 % (ref 11.5–15.5)
WBC: 5.1 10*3/uL (ref 4.0–10.5)

## 2023-08-03 LAB — COMPREHENSIVE METABOLIC PANEL
ALT: 15 U/L (ref 0–35)
AST: 18 U/L (ref 0–37)
Albumin: 4.5 g/dL (ref 3.5–5.2)
Alkaline Phosphatase: 77 U/L (ref 39–117)
BUN: 17 mg/dL (ref 6–23)
CO2: 28 meq/L (ref 19–32)
Calcium: 9.5 mg/dL (ref 8.4–10.5)
Chloride: 102 meq/L (ref 96–112)
Creatinine, Ser: 0.81 mg/dL (ref 0.40–1.20)
GFR: 74.65 mL/min (ref >60.00)
Glucose, Bld: 96 mg/dL (ref 70–99)
Potassium: 4.5 meq/L (ref 3.5–5.1)
Sodium: 139 meq/L (ref 135–145)
Total Bilirubin: 0.6 mg/dL (ref 0.2–1.2)
Total Protein: 7 g/dL (ref 6.0–8.3)

## 2023-08-03 LAB — HEMOGLOBIN A1C: Hgb A1c MFr Bld: 5.6 % (ref 4.6–6.5)

## 2023-08-03 LAB — LIPID PANEL
Cholesterol: 222 mg/dL — ABNORMAL HIGH (ref 0–200)
HDL: 53 mg/dL (ref >39.00)
LDL Cholesterol: 142 mg/dL — ABNORMAL HIGH (ref 0–99)
NonHDL: 169.16
Total CHOL/HDL Ratio: 4
Triglycerides: 138 mg/dL (ref 0.0–149.0)
VLDL: 27.6 mg/dL (ref 0.0–40.0)

## 2023-08-03 LAB — TSH: TSH: 1.12 u[IU]/mL (ref 0.35–5.50)

## 2023-08-03 NOTE — Progress Notes (Unsigned)
    Tracy Blankenship [facility] [address] [place] [address] Phone: [phone]   Assessment and Plan:     There are no diagnoses linked to this encounter.  ***   Pertinent previous records reviewed include ***    Follow Up: ***     Subjective:   I, Tracy Blankenship, am serving as a Neurosurgeon for Doctor Richardean Sale   Chief Complaint: left leg and hip pain    HPI:    07/07/2023 Patient is a 68 year old female with left leg and hip pain. Patient states that she had tightness in her hamstrings that was intermittent. She has decreased ROM . Pain has radiated to her hip she has a sharp pain. She doesn't feel that her leg stable. She has a hard time getting up and down steps. No meds for the pain. No MOI. No numbness and tingling   08/04/2023 Patient states   Relevant Historical Information: None pertinent  Additional pertinent review of systems negative.   Current Outpatient Medications:    Calcium Carbonate-Vit D-Min (CALCIUM 1200) 1200-1000 MG-UNIT CHEW, Chew by mouth., Disp: , Rfl:    Cetirizine HCl 10 MG CAPS, Take 1 capsule by mouth daily as needed., Disp: , Rfl:    famotidine (PEPCID) 20 MG tablet, Take 1 tablet (20 mg total) by mouth 2 (two) times daily as needed for heartburn or indigestion., Disp: 60 tablet, Rfl: 11   meloxicam (MOBIC) 15 MG tablet, Take 1 tablet (15 mg total) by mouth daily., Disp: 30 tablet, Rfl: 0   Multiple Vitamin (MULTIVITAMIN) tablet, Take 1 tablet by mouth daily., Disp: , Rfl:    ofloxacin (OCUFLOX) 0.3 % ophthalmic solution, Place 1 drop into both eyes 4 (four) times daily., Disp: 5 mL, Rfl: 0   polyethylene glycol powder (MIRALAX) 17 GM/SCOOP powder, Take 1 Container by mouth daily as needed., Disp: , Rfl:    Triamcinolone Acetonide (NASACORT ALLERGY 24HR NA), Place 2 sprays into the nose daily., Disp: , Rfl:    Objective:     There were no vitals filed for this visit.    There is no height or  weight on file to calculate BMI.    Physical Exam:    ***   Electronically signed by:  Tracy Blankenship [facility] 3:43 PM 08/03/23

## 2023-08-03 NOTE — Telephone Encounter (Signed)
 Lab order

## 2023-08-04 ENCOUNTER — Ambulatory Visit: Payer: Medicare PPO | Admitting: Sports Medicine

## 2023-08-04 VITALS — BP 122/60 | HR 86 | Ht 63.0 in | Wt 166.0 lb

## 2023-08-04 DIAGNOSIS — S76312D Strain of muscle, fascia and tendon of the posterior muscle group at thigh level, left thigh, subsequent encounter: Secondary | ICD-10-CM | POA: Diagnosis not present

## 2023-08-04 DIAGNOSIS — M7062 Trochanteric bursitis, left hip: Secondary | ICD-10-CM | POA: Diagnosis not present

## 2023-08-04 DIAGNOSIS — M25552 Pain in left hip: Secondary | ICD-10-CM

## 2023-08-04 NOTE — Patient Instructions (Signed)
-   Start Tylenol 500 to 1000 mg tablets 2-3 times a day for day-to-day pain relief Use remaning meloxicam as need no more than 1 does per week (other NSAIDs as well) Continue HEP Follow up in 4 to 6 weeks.

## 2023-08-05 ENCOUNTER — Ambulatory Visit (INDEPENDENT_AMBULATORY_CARE_PROVIDER_SITE_OTHER): Payer: Medicare PPO

## 2023-08-05 VITALS — Ht 63.0 in | Wt 166.0 lb

## 2023-08-05 DIAGNOSIS — Z0000 Encounter for general adult medical examination without abnormal findings: Secondary | ICD-10-CM | POA: Diagnosis not present

## 2023-08-05 NOTE — Progress Notes (Signed)
 Subjective:   Tracy Blankenship is a 68 y.o. who presents for a Medicare Wellness preventive visit.  Visit Complete: Virtual I connected with  Barton Fanny Stgermaine on 08/05/23 by a audio enabled telemedicine application and verified that I am speaking with the correct person using two identifiers.  Patient Location: Home  Provider Location: Office/Clinic  I discussed the limitations of evaluation and management by telemedicine. The patient expressed understanding and agreed to proceed.  Vital Signs: Because this visit was a virtual/telehealth visit, some criteria may be missing or patient reported. Any vitals not documented were not able to be obtained and vitals that have been documented are patient reported.  VideoDeclined- This patient declined Librarian, academic. Therefore the visit was completed with audio only.  AWV Questionnaire: No: Patient Medicare AWV questionnaire was not completed prior to this visit.  Cardiac Risk Factors include: advanced age (>28men, >53 women);dyslipidemia     Objective:    Today's Vitals   08/05/23 1525  Weight: 166 lb (75.3 kg)  Height: 5\' 3"  (1.6 m)   Body mass index is 29.41 kg/m.     08/05/2023    3:42 PM 07/31/2022   11:49 AM 11/06/2017    5:38 PM 09/09/2017    9:56 AM 08/18/2017    9:42 AM  Advanced Directives  Does Patient Have a Medical Advance Directive? Yes Yes Yes Yes Yes  Type of Estate agent of [place];Living will Healthcare Power of [place];Living will Healthcare Power of [place];Living will Living will Healthcare Power of [place];Living will  Copy of Healthcare Power of Attorney in Chart? Yes - validated most recent copy scanned in chart (See row information) No - copy requested       Current Medications (verified) Outpatient Encounter Medications as of 08/05/2023  Medication Sig   Calcium Carbonate-Vit D-Min (CALCIUM 1200) 1200-1000 MG-UNIT CHEW Chew by mouth.   Cetirizine HCl 10  MG CAPS Take 1 capsule by mouth daily as needed.   famotidine (PEPCID) 20 MG tablet Take 1 tablet (20 mg total) by mouth 2 (two) times daily as needed for heartburn or indigestion.   Multiple Vitamin (MULTIVITAMIN) tablet Take 1 tablet by mouth daily.   polyethylene glycol powder (MIRALAX) 17 GM/SCOOP powder Take 1 Container by mouth daily as needed.   Triamcinolone Acetonide (NASACORT ALLERGY 24HR NA) Place 2 sprays into the nose daily.   meloxicam (MOBIC) 15 MG tablet Take 1 tablet (15 mg total) by mouth daily. (Patient not taking: Reported on 08/05/2023)   ofloxacin (OCUFLOX) 0.3 % ophthalmic solution Place 1 drop into both eyes 4 (four) times daily. (Patient not taking: Reported on 08/05/2023)   No facility-administered encounter medications on file as of 08/05/2023.    Allergies (verified) Claritin [loratadine], Penicillins, and Pseudoephedrine   History: Past Medical History:  Diagnosis Date   Allergy    Chronic sinusitis    Constipation    Hyperglycemia    IC (interstitial cystitis)    Plantar fasciitis    Pneumonia    Stress reaction    Trigger finger    Past Surgical History:  Procedure Laterality Date   APPENDECTOMY     CESAREAN SECTION     X 2   WISDOM TOOTH EXTRACTION     Family History  Problem Relation Age of Onset   Cardiomyopathy Father    Diabetes Father    Stroke Father    Anxiety disorder Mother    Lupus Mother    Emphysema Mother  Coronary artery disease Mother    Lung cancer Mother    Hypertension Mother    Heart disease Mother    COPD Mother    Kidney disease Mother    Colon cancer Neg Hx    Breast cancer Neg Hx    Colon polyps Neg Hx    Esophageal cancer Neg Hx    Rectal cancer Neg Hx    Stomach cancer Neg Hx    Social History   Socioeconomic History   Marital status: Married    Spouse name: Not on file   Number of children: 2   Years of education: Not on file   Highest education level: Not on file  Occupational History    Occupation: TEACHER    Employer: [employer]  Tobacco Use   Smoking status: Never   Smokeless tobacco: Never  Vaping Use   Vaping status: Never Used  Substance and Sexual Activity   Alcohol use: Yes    Alcohol/week: 0.0 standard drinks of alcohol    Comment: wine -every 3-4 months   Drug use: No   Sexual activity: Not on file  Other Topics Concern   Not on file  Social History Narrative   Not on file   Social Drivers of Health   Financial Resource Strain: Low Risk  (08/05/2023)   Overall Financial Resource Strain (CARDIA)    Difficulty of Paying Living Expenses: Not hard at all  Food Insecurity: No Food Insecurity (08/05/2023)   Hunger Vital Sign    Worried About Running Out of Food in the Last Year: Never true    Ran Out of Food in the Last Year: Never true  Transportation Needs: No Transportation Needs (08/05/2023)   PRAPARE - Administrator, Civil Service (Medical): No    Lack of Transportation (Non-Medical): No  Physical Activity: Insufficiently Active (08/05/2023)   Exercise Vital Sign    Days of Exercise per Week: 3 days    Minutes of Exercise per Session: 30 min  Stress: Stress Concern Present (08/05/2023)   [employer] of Occupational Health - Occupational Stress Questionnaire    Feeling of Stress : To some extent  Social Connections: Moderately Isolated (08/05/2023)   Social Connection and Isolation Panel [NHANES]    Frequency of Communication with Friends and Family: Three times a week    Frequency of Social Gatherings with Friends and Family: Three times a week    Attends Religious Services: Never    Active Member of Clubs or Organizations: No    Attends Banker Meetings: Never    Marital Status: Married    Tobacco Counseling Counseling given: Not Answered    Clinical Intake:  Pre-visit preparation completed: Yes  Pain : No/denies pain     BMI - recorded: 29.41 Nutritional Status: BMI 25 -29  Overweight Nutritional Risks: None Diabetes: No  How often do you need to have someone help you when you read instructions, pamphlets, or other written materials from your doctor or pharmacy?: 1 - Never  Interpreter Needed?: No  Comments: lives with husband Information entered by :: B.Jolan Upchurch,LPN   Activities of Daily Living     08/05/2023    3:43 PM  In your present state of health, do you have any difficulty performing the following activities:  Hearing? 0  Vision? 0  Difficulty concentrating or making decisions? 0  Walking or climbing stairs? 0  Dressing or bathing? 0  Doing errands, shopping? 0  Preparing Food and eating ?  N  Using the Toilet? N  In the past six months, have you accidently leaked urine? N  Do you have problems with loss of bowel control? N  Managing your Medications? N  Managing your Finances? N  Housekeeping or managing your Housekeeping? N    Patient Care Team: Tower, Audrie Gallus, MD as PCP - General  Indicate any recent Medical Services you may have received from other than [facility] providers in the past year (date may be approximate).     Assessment:   This is a routine wellness examination for Tatyanna.  Hearing/Vision screen Hearing Screening - Comments:: Pt says had hearing tested last year w/only mild hearing loss Vision Screening - Comments:: Pt says her vision is good- Geneticist, molecular   Goals Addressed             This Visit's Progress    Patient Stated       08/05/23-Would like to lose 20 lbs by walking and eat healthier       Depression Screen     08/05/2023    3:36 PM 07/31/2022   11:48 AM 07/22/2022   12:26 PM 07/29/2021   11:01 AM 07/25/2020    2:34 PM 02/17/2019   10:12 AM 02/15/2018    8:52 AM  PHQ 2/9 Scores  PHQ - 2 Score 0 0 0 0 0 0 0  PHQ- 9 Score  0 1   1     Fall Risk     08/05/2023    3:28 PM 07/31/2022   11:50 AM 07/22/2022   12:26 PM 07/29/2021   10:57 AM 07/25/2020    2:34 PM  Fall Risk   Falls in the past  year? 0 0 0 0 0  Number falls in past yr: 0 0 0    Injury with Fall? 0 0 0    Risk for fall due to : No Fall Risks No Fall Risks No Fall Risks    Follow up Education provided;Falls prevention discussed Falls prevention discussed;Falls evaluation completed Falls evaluation completed Falls evaluation completed Falls evaluation completed    MEDICARE RISK AT HOME:  Medicare Risk at Home Any stairs in or around the home?: Yes If so, are there any without handrails?: Yes Home free of loose throw rugs in walkways, pet beds, electrical cords, etc?: Yes Adequate lighting in your home to reduce risk of falls?: Yes Life alert?: No Use of a cane, walker or w/c?: No Grab bars in the bathroom?: No Shower chair or bench in shower?: No Elevated toilet seat or a handicapped toilet?: No  TIMED UP AND GO:  Was the test performed?  No  Cognitive Function: 6CIT completed        08/05/2023    3:46 PM 07/31/2022   11:53 AM  6CIT Screen  What Year? 0 points 0 points  What month? 0 points 0 points  What time? 0 points 0 points  Count back from 20 0 points 0 points  Months in reverse 0 points 0 points  Repeat phrase 0 points 0 points  Total Score 0 points 0 points    Immunizations Immunization History  Administered Date(s) Administered   Fluad Quad(high Dose 65+) 05/08/2020, 07/29/2021   Influenza Split 04/22/2011, 04/27/2012   Influenza,inj,Quad PF,6+ Mos 06/22/2013, 02/05/2015, 02/08/2016, 02/09/2017, 02/15/2018, 03/24/2019   Influenza-Unspecified 03/03/2022   PFIZER(Purple Top)SARS-COV-2 Vaccination 08/08/2019, 08/29/2019, 02/28/2020   PNEUMOCOCCAL CONJUGATE-20 11/11/2022   Pfizer(Comirnaty)Fall Seasonal Vaccine 12 years and older 04/02/2022   Pneumococcal Conjugate-13 08/07/2020  Td 08/28/1997, 02/26/2007   Tdap 04/27/2012, 04/02/2022    Screening Tests Health Maintenance  Topic Date Due   Hepatitis C Screening  Never done   Zoster Vaccines- Shingrix (1 of 2) Never done    INFLUENZA VACCINE  08/24/2023 (Originally 12/25/2022)   COVID-19 Vaccine (5 - 2024-25 season) 05/10/2024 (Originally 01/25/2023)   MAMMOGRAM  02/17/2024   Medicare Annual Wellness (AWV)  08/04/2024   Colonoscopy  09/24/2029   DTaP/Tdap/Td (5 - Td or Tdap) 04/02/2032   Pneumonia Vaccine 10+ Years old  Completed   DEXA SCAN  Completed   HPV VACCINES  Aged Out    Health Maintenance  Health Maintenance Due  Topic Date Due   Hepatitis C Screening  Never done   Zoster Vaccines- Shingrix (1 of 2) Never done   Health Maintenance Items Addressed: None-Pt says she has Influenza at the pharmacy a few weeks ago and will bring the paper tomorrow.  Additional Screening:  Vision Screening: Recommended annual ophthalmology exams for early detection of glaucoma and other disorders of the eye.  Dental Screening: Recommended annual dental exams for proper oral hygiene  Community Resource Referral / Chronic Care Management: CRR required this visit?  No   CCM required this visit?  No    Plan:     I have personally reviewed and noted the following in the patient's chart:   Medical and social history Use of alcohol, tobacco or illicit drugs  Current medications and supplements including opioid prescriptions. Patient is not currently taking opioid prescriptions. Functional ability and status Nutritional status Physical activity Advanced directives List of other physicians Hospitalizations, surgeries, and ER visits in previous 12 months Vitals Screenings to include cognitive, depression, and falls Referrals and appointments  In addition, I have reviewed and discussed with patient certain preventive protocols, quality metrics, and best practice recommendations. A written personalized care plan for preventive services as well as general preventive health recommendations were provided to patient.    Sue Lush, LPN   09/01/8117   After Visit Summary: (MyChart) Due to this being a  telephonic visit, the after visit summary with patients personalized plan was offered to patient via MyChart   Notes: Nothing significant to report at this time.

## 2023-08-05 NOTE — Patient Instructions (Signed)
 Ms. Kreitzer , Thank you for taking time to come for your Medicare Wellness Visit. I appreciate your ongoing commitment to your health goals. Please review the following plan we discussed and let me know if I can assist you in the future.   Referrals/Orders/Follow-Ups/Clinician Recommendations: none  This is a list of the screening recommended for you and due dates:  Health Maintenance  Topic Date Due   Hepatitis C Screening  Never done   Zoster (Shingles) Vaccine (1 of 2) Never done   Flu Shot  08/24/2023*   COVID-19 Vaccine (5 - 2024-25 season) 05/10/2024*   Mammogram  02/17/2024   Medicare Annual Wellness Visit  08/04/2024   Colon Cancer Screening  09/24/2029   DTaP/Tdap/Td vaccine (5 - Td or Tdap) 04/02/2032   Pneumonia Vaccine  Completed   DEXA scan (bone density measurement)  Completed   HPV Vaccine  Aged Out  *Topic was postponed. The date shown is not the original due date.    Advanced directives: (In Chart) A copy of your advanced directives are scanned into your chart should your provider ever need it.  Next Medicare Annual Wellness Visit scheduled for next year: Yes 08/05/24 @ 3:40pm televisit

## 2023-08-06 ENCOUNTER — Ambulatory Visit: Payer: Medicare PPO | Admitting: Family Medicine

## 2023-08-06 ENCOUNTER — Encounter: Payer: Self-pay | Admitting: Family Medicine

## 2023-08-06 VITALS — BP 129/70 | HR 74 | Temp 98.4°F | Ht 62.0 in | Wt 163.5 lb

## 2023-08-06 DIAGNOSIS — R7303 Prediabetes: Secondary | ICD-10-CM | POA: Diagnosis not present

## 2023-08-06 DIAGNOSIS — R030 Elevated blood-pressure reading, without diagnosis of hypertension: Secondary | ICD-10-CM | POA: Diagnosis not present

## 2023-08-06 DIAGNOSIS — Z0000 Encounter for general adult medical examination without abnormal findings: Secondary | ICD-10-CM

## 2023-08-06 DIAGNOSIS — E785 Hyperlipidemia, unspecified: Secondary | ICD-10-CM

## 2023-08-06 NOTE — Progress Notes (Signed)
 Subjective:    Patient ID: Tracy Blankenship, female    DOB: Nov 29, 1955, 68 y.o.   MRN: [id number]  HPI  Here for health maintenance exam and to review chronic medical problems   Wt Readings from Last 3 Encounters:  08/06/23 163 lb 8 oz (74.2 kg)  08/05/23 166 lb (75.3 kg)  08/04/23 166 lb (75.3 kg)   29.90 kg/m  Vitals:   08/06/23 0943 08/06/23 1014  BP: (!) 154/86 129/70  Pulse: 74   Temp: 98.4 F (36.9 C)   SpO2: 97%     Immunization History  Administered Date(s) Administered   Fluad Quad(high Dose 65+) 05/08/2020, 07/29/2021   Influenza Split 04/22/2011, 04/27/2012   Influenza, High Dose Seasonal PF 07/23/2023   Influenza,inj,Quad PF,6+ Mos 06/22/2013, 02/05/2015, 02/08/2016, 02/09/2017, 02/15/2018, 03/24/2019   Influenza-Unspecified 03/03/2022   PFIZER(Purple Top)SARS-COV-2 Vaccination 08/08/2019, 08/29/2019, 02/28/2020   PNEUMOCOCCAL CONJUGATE-20 11/11/2022   Pfizer(Comirnaty)Fall Seasonal Vaccine 12 years and older 04/02/2022   Pneumococcal Conjugate-13 08/07/2020   Td 08/28/1997, 02/26/2007   Tdap 04/27/2012, 04/02/2022    Health Maintenance Due  Topic Date Due   Hepatitis C Screening  Never done   Doing ok  Working on furniture (with a mask)   Sees Dr Osker [facility] for left hip pain    Interested in shingrix - getting it very soon    Mammogram 01/2023  Self breast exam-non lumps   Gyn health History of vaginal dryness in past  No new partners  Normal pap with neg HPV 2019     Colon cancer screening  09/2022 colonoscopy with 7 yrear recall   Bone health  Dexa   3/20222 normal range    Sister has osteoporosis  Falls-none  Fractures-none  Supplements ca and D   (misses doses)    Exercise  Is active  No intentional exercise  Works on furniture    Mood    08/05/2023    3:36 PM 07/31/2022   11:48 AM 07/22/2022   12:26 PM 07/29/2021   11:01 AM 07/25/2020    2:34 PM  Depression screen PHQ 2/9  Decreased Interest 0 0 0 0 0  Down,  Depressed, Hopeless 0 0 0 0 0  PHQ - 2 Score 0 0 0 0 0  Altered sleeping  0 1    Tired, decreased energy  0 0    Change in appetite  0 0    Feeling bad or failure about yourself   0 0    Trouble concentrating  0 0    Moving slowly or fidgety/restless  0 0    Suicidal thoughts  0 0    PHQ-9 Score  0 1    Difficult doing work/chores  Not difficult at all Not difficult at all       Elevated blood pressure reading BP Readings from Last 3 Encounters:  08/06/23 129/70  08/04/23 122/60  07/07/23 130/68   Pulse Readings from Last 3 Encounters:  08/06/23 74  08/04/23 86  07/07/23 82     Lab Results  Component Value Date   NA 139 08/03/2023   K 4.5 08/03/2023   CO2 28 08/03/2023   GLUCOSE 96 08/03/2023   BUN 17 08/03/2023   CREATININE 0.81 08/03/2023   CALCIUM 9.5 08/03/2023   GFR 74.65 08/03/2023   GFRNONAA >60 11/06/2017   GERD Pepcid 20 mg prn   Prn meloxicam   Lab Results  Component Value Date   ALT 15 08/03/2023   AST 18 08/03/2023  ALKPHOS 77 08/03/2023   BILITOT 0.6 08/03/2023      Hyperlipidemia Lab Results  Component Value Date   CHOL 222 (H) 08/03/2023   CHOL 206 (H) 07/29/2022   CHOL 216 (H) 07/23/2021   Lab Results  Component Value Date   HDL 53.00 08/03/2023   HDL 57.30 07/29/2022   HDL 56.70 07/23/2021   Lab Results  Component Value Date   LDLCALC 142 (H) 08/03/2023   LDLCALC 121 (H) 07/29/2022   LDLCALC 137 (H) 07/23/2021   Lab Results  Component Value Date   TRIG 138.0 08/03/2023   TRIG 141.0 07/29/2022   TRIG 110.0 07/23/2021   Lab Results  Component Value Date   CHOLHDL 4 08/03/2023   CHOLHDL 4 07/29/2022   CHOLHDL 4 07/23/2021   Lab Results  Component Value Date   LDLDIRECT 126.5 12/17/2009   Diet controlled The 10-year ASCVD risk score (Arnett DK, et al., 2019) is: 8.2%   Values used to calculate the score:     Age: 63 years     Sex: Female     Is Non-Hispanic African American: No     Diabetic: No     Tobacco  smoker: No     Systolic Blood Pressure: 129 mmHg     Is BP treated: No     HDL Cholesterol: 53 mg/dL     Total Cholesterol: 222 mg/dL      Prediabetes Lab Results  Component Value Date   HGBA1C 5.6 08/03/2023   HGBA1C 5.6 07/29/2022   HGBA1C 5.6 07/23/2021    Patient Active Problem List   Diagnosis Date Noted   Family history of heart disease 05/11/2023   Hearing loss 11/11/2022   Abnormal chest x-ray 08/05/2022   Elevated blood pressure reading 07/28/2022   History of colon polyps 07/22/2022   Estrogen deficiency 07/25/2020   Eczema 02/09/2017   Mild hyperlipidemia 02/08/2016   H/O herpes zoster 01/31/2014   Vaginal dryness 10/29/2012   Routine general medical examination at a health care facility 03/30/2011   Urinary incontinence 01/07/2011   Allergic rhinitis 12/21/2009   INTERSTITIAL CYSTITIS 12/08/2006   Prediabetes 12/08/2006   Past Medical History:  Diagnosis Date   Allergy    Chronic sinusitis    Constipation    Hyperglycemia    IC (interstitial cystitis)    Plantar fasciitis    Pneumonia    Stress reaction    Trigger finger    Past Surgical History:  Procedure Laterality Date   APPENDECTOMY     CESAREAN SECTION     X 2   WISDOM TOOTH EXTRACTION     Social History   Tobacco Use   Smoking status: Never   Smokeless tobacco: Never  Vaping Use   Vaping status: Never Used  Substance Use Topics   Alcohol use: Yes    Alcohol/week: 0.0 standard drinks of alcohol    Comment: wine -every 3-4 months   Drug use: No   Family History  Problem Relation Age of Onset   Cardiomyopathy Father    Diabetes Father    Stroke Father    Anxiety disorder Mother    Lupus Mother    Emphysema Mother    Coronary artery disease Mother    Lung cancer Mother    Hypertension Mother    Heart disease Mother    COPD Mother    Kidney disease Mother    Colon cancer Neg Hx    Breast cancer Neg Hx    Colon polyps  Neg Hx    Esophageal cancer Neg Hx    Rectal cancer  Neg Hx    Stomach cancer Neg Hx    Allergies  Allergen Reactions   Claritin [Loratadine] Other (See Comments)    Felt jittery   Penicillins     REACTION: ? allergy   Polymyxin B-Trimethoprim     Made eye swelling worse    Pseudoephedrine     REACTION: makes her hyper   Current Outpatient Medications on File Prior to Visit  Medication Sig Dispense Refill   Calcium Carbonate-Vit D-Min (CALCIUM 1200) 1200-1000 MG-UNIT CHEW Chew by mouth.     Cetirizine HCl 10 MG CAPS Take 1 capsule by mouth daily as needed.     famotidine (PEPCID) 20 MG tablet Take 1 tablet (20 mg total) by mouth 2 (two) times daily as needed for heartburn or indigestion. 60 tablet 11   meloxicam (MOBIC) 15 MG tablet Take 1 tablet (15 mg total) by mouth daily. (Patient taking differently: Take 15 mg by mouth daily as needed.) 30 tablet 0   Multiple Vitamin (MULTIVITAMIN) tablet Take 1 tablet by mouth daily.     polyethylene glycol powder (MIRALAX) 17 GM/SCOOP powder Take 1 Container by mouth daily as needed.     Triamcinolone Acetonide (NASACORT ALLERGY 24HR NA) Place 2 sprays into the nose daily.     No current facility-administered medications on file prior to visit.    Review of Systems  Constitutional:  Negative for activity change, appetite change, fatigue, fever and unexpected weight change.  HENT:  Negative for congestion, ear pain, rhinorrhea, sinus pressure and sore throat.   Eyes:  Negative for pain, redness and visual disturbance.  Respiratory:  Negative for cough, shortness of breath and wheezing.   Cardiovascular:  Negative for chest pain and palpitations.  Gastrointestinal:  Negative for abdominal pain, blood in stool, constipation and diarrhea.  Endocrine: Negative for polydipsia and polyuria.  Genitourinary:  Negative for dysuria, frequency and urgency.  Musculoskeletal:  Negative for arthralgias, back pain and myalgias.  Skin:  Negative for pallor and rash.  Allergic/Immunologic: Negative for  environmental allergies.  Neurological:  Negative for dizziness, syncope and headaches.  Hematological:  Negative for adenopathy. Does not bruise/bleed easily.  Psychiatric/Behavioral:  Negative for decreased concentration and dysphoric mood. The patient is not nervous/anxious.        Objective:   Physical Exam Constitutional:      General: She is not in acute distress.    Appearance: Normal appearance. She is well-developed. She is not ill-appearing or diaphoretic.     Comments: Overweight   HENT:     Head: Normocephalic and atraumatic.     Right Ear: Tympanic membrane, ear canal and external ear normal.     Left Ear: Tympanic membrane, ear canal and external ear normal.     Nose: Nose normal. No congestion.     Mouth/Throat:     Mouth: Mucous membranes are moist.     Pharynx: Oropharynx is clear. No posterior oropharyngeal erythema.  Eyes:     General: No scleral icterus.    Extraocular Movements: Extraocular movements intact.     Conjunctiva/sclera: Conjunctivae normal.     Pupils: Pupils are equal, round, and reactive to light.  Neck:     Thyroid: No thyromegaly.     Vascular: No carotid bruit or JVD.  Cardiovascular:     Rate and Rhythm: Normal rate and regular rhythm.     Pulses: Normal pulses.  Heart sounds: Normal heart sounds.     No gallop.  Pulmonary:     Effort: Pulmonary effort is normal. No respiratory distress.     Breath sounds: Normal breath sounds. No wheezing.     Comments: Good air exch Chest:     Chest wall: No tenderness.  Abdominal:     General: Bowel sounds are normal. There is no distension or abdominal bruit.     Palpations: Abdomen is soft. There is no mass.     Tenderness: There is no abdominal tenderness.     Hernia: No hernia is present.  Genitourinary:    Comments: Breast exam: No mass, nodules, thickening, tenderness, bulging, retraction, inflamation, nipple discharge or skin changes noted.  No axillary or clavicular LA.      Musculoskeletal:        General: No tenderness. Normal range of motion.     Cervical back: Normal range of motion and neck supple. No rigidity. No muscular tenderness.     Right lower leg: No edema.     Left lower leg: No edema.     Comments: No kyphosis   Lymphadenopathy:     Cervical: No cervical adenopathy.  Skin:    General: Skin is warm and dry.     Coloration: Skin is not pale.     Findings: No erythema or rash.     Comments: Solar lentigines diffusely Some sks   Neurological:     Mental Status: She is alert. Mental status is at baseline.     Cranial Nerves: No cranial nerve deficit.     Motor: No abnormal muscle tone.     Coordination: Coordination normal.     Gait: Gait normal.     Deep Tendon Reflexes: Reflexes are normal and symmetric. Reflexes normal.  Psychiatric:        Mood and Affect: Mood normal.        Cognition and Memory: Cognition and memory normal.           Assessment & Plan:   Problem List Items Addressed This Visit       Other   Routine general medical examination at a health care facility - Primary   Reviewed health habits including diet and exercise and skin cancer prevention Reviewed appropriate screening tests for age  Also reviewed health mt list, fam hx and immunization status , as well as social and family history   See HPI Labs reviewed and ordered Health Maintenance  Topic Date Due   Hepatitis C Screening  Never done   Zoster (Shingles) Vaccine (1 of 2) 11/06/2023*   COVID-19 Vaccine (5 - 2024-25 season) 05/10/2024*   Mammogram  02/17/2024   Medicare Annual Wellness Visit  08/04/2024   Colon Cancer Screening  09/24/2029   DTaP/Tdap/Td vaccine (5 - Td or Tdap) 04/02/2032   Pneumonia Vaccine  Completed   Flu Shot  Completed   DEXA scan (bone density measurement)  Completed   HPV Vaccine  Aged Out  *Topic was postponed. The date shown is not the original due date.   Discussed fall prevention, supplements and exercise for bone  density  Last dexa normal  PHQ 0 Watching blood pressure       Prediabetes   Lab Results  Component Value Date   HGBA1C 5.6 08/03/2023   HGBA1C 5.6 07/29/2022   HGBA1C 5.6 07/23/2021   disc imp of low glycemic diet and wt loss to prevent DM2       Mild hyperlipidemia  Disc goals for lipids and reasons to control them Rev last labs with pt Rev low sat fat diet in detail LDL 142  Overall good diet but wants to work on it more   Re check planned 3 mo  10 y risk score 11.4%      Relevant Orders   Lipid panel   Elevated blood pressure reading   Improved on 2nd check  BP Readings from Last 3 Encounters:  08/06/23 129/70  08/04/23 122/60  07/07/23 130/68   Will continue to monitor Discussed goals for lifestyle change

## 2023-08-06 NOTE — Assessment & Plan Note (Signed)
 Lab Results  Component Value Date   HGBA1C 5.6 08/03/2023   HGBA1C 5.6 07/29/2022   HGBA1C 5.6 07/23/2021   disc imp of low glycemic diet and wt loss to prevent DM2

## 2023-08-06 NOTE — Assessment & Plan Note (Signed)
 Improved on 2nd check  BP Readings from Last 3 Encounters:  08/06/23 129/70  08/04/23 122/60  07/07/23 130/68   Will continue to monitor Discussed goals for lifestyle change

## 2023-08-06 NOTE — Patient Instructions (Addendum)
 If you are interested in the new shingles vaccine (Shingrix) - call your local pharmacy to check on coverage and availability   If interested in RSV vaccine -can get that at the pharmacy     Take your calcium with D  Time for a pill box   Try to prioritize exercise  Walking or other cardio/ get your steps Add some strength training to your routine, this is important for bone and brain health and can reduce your risk of falls and help your body use insulin properly and regulate weight  Light weights, exercise bands , and internet videos are a good way to start  Yoga (chair or regular), machines , floor exercises or a gym with machines are also good options   For cholesterol Avoid red meat/ fried foods/ egg yolks/ fatty breakfast meats/ butter, cheese and high fat dairy/ and shellfish    For diabetes prevention  Try to get most of your carbohydrates from produce (with the exception of white potatoes) and whole grains Eat less bread/pasta/rice/snack foods/cereals/sweets and other items from the middle of the grocery store (processed carbs)  Schedule fasting labs for cholesterol in about 3 months

## 2023-08-06 NOTE — Assessment & Plan Note (Signed)
 Disc goals for lipids and reasons to control them Rev last labs with pt Rev low sat fat diet in detail LDL 142  Overall good diet but wants to work on it more   Re check planned 3 mo  10 y risk score 11.4%

## 2023-08-06 NOTE — Assessment & Plan Note (Signed)
 Reviewed health habits including diet and exercise and skin cancer prevention Reviewed appropriate screening tests for age  Also reviewed health mt list, fam hx and immunization status , as well as social and family history   See HPI Labs reviewed and ordered Health Maintenance  Topic Date Due   Hepatitis C Screening  Never done   Zoster (Shingles) Vaccine (1 of 2) 11/06/2023*   COVID-19 Vaccine (5 - 2024-25 season) 05/10/2024*   Mammogram  02/17/2024   Medicare Annual Wellness Visit  08/04/2024   Colon Cancer Screening  09/24/2029   DTaP/Tdap/Td vaccine (5 - Td or Tdap) 04/02/2032   Pneumonia Vaccine  Completed   Flu Shot  Completed   DEXA scan (bone density measurement)  Completed   HPV Vaccine  Aged Out  *Topic was postponed. The date shown is not the original due date.   Discussed fall prevention, supplements and exercise for bone density  Last dexa normal  PHQ 0 Watching blood pressure

## 2023-08-11 DIAGNOSIS — H02403 Unspecified ptosis of bilateral eyelids: Secondary | ICD-10-CM | POA: Diagnosis not present

## 2023-08-11 DIAGNOSIS — H52203 Unspecified astigmatism, bilateral: Secondary | ICD-10-CM | POA: Diagnosis not present

## 2023-08-11 DIAGNOSIS — H2513 Age-related nuclear cataract, bilateral: Secondary | ICD-10-CM | POA: Diagnosis not present

## 2023-08-28 ENCOUNTER — Telehealth: Payer: Self-pay | Admitting: Sports Medicine

## 2023-08-28 NOTE — Telephone Encounter (Signed)
 Patient called and stated that her leg is feeling worse after the exercises and not better and she is having a hard time. She called to see if she could schedule an appointment sooner than Tuesday but there are currently no openings. I have her on the call list if an appointment opens sooner. FYI.

## 2023-08-31 NOTE — Progress Notes (Deleted)
    Aleen Sells D.[facility] [address] [place] [address] Phone: [phone]   Assessment and Plan:     There are no diagnoses linked to this encounter.  ***   Pertinent previous records reviewed include ***    Follow Up: ***     Subjective:   I, Ellisa Devivo, am serving as a Neurosurgeon for Doctor Richardean Sale  Chief Complaint: left leg and hip pain    HPI:    07/07/2023 Patient is a 68 year old female with left leg and hip pain. Patient states that she had tightness in her hamstrings that was intermittent. She has decreased ROM . Pain has radiated to her hip she has a sharp pain. She doesn't feel that her leg stable. She has a hard time getting up and down steps. No meds for the pain. No MOI. No numbness and tingling    08/04/2023 Patient states hip is better the meloxicam helped. Hasn't used the medication since the two weeks prescribed. Left leg in the back hurts and she has not been doing the exercises. Patient states that she has vertigo and the floor exercises makes it difficult.  09/01/2023 Patient states   Relevant Historical Information: None pertinent  Additional pertinent review of systems negative.   Current Outpatient Medications:    Calcium Carbonate-Vit D-Min (CALCIUM 1200) 1200-1000 MG-UNIT CHEW, Chew by mouth., Disp: , Rfl:    Cetirizine HCl 10 MG CAPS, Take 1 capsule by mouth daily as needed., Disp: , Rfl:    famotidine (PEPCID) 20 MG tablet, Take 1 tablet (20 mg total) by mouth 2 (two) times daily as needed for heartburn or indigestion., Disp: 60 tablet, Rfl: 11   meloxicam (MOBIC) 15 MG tablet, Take 1 tablet (15 mg total) by mouth daily. (Patient taking differently: Take 15 mg by mouth daily as needed.), Disp: 30 tablet, Rfl: 0   Multiple Vitamin (MULTIVITAMIN) tablet, Take 1 tablet by mouth daily., Disp: , Rfl:    polyethylene glycol powder (MIRALAX) 17 GM/SCOOP powder, Take 1 Container by mouth daily as  needed., Disp: , Rfl:    Triamcinolone Acetonide (NASACORT ALLERGY 24HR NA), Place 2 sprays into the nose daily., Disp: , Rfl:    Objective:     There were no vitals filed for this visit.    There is no height or weight on file to calculate BMI.    Physical Exam:    ***   Electronically signed by:  Aleen Sells D.[facility] 1:52 PM 08/31/23

## 2023-09-01 ENCOUNTER — Ambulatory Visit (INDEPENDENT_AMBULATORY_CARE_PROVIDER_SITE_OTHER): Admitting: Family Medicine

## 2023-09-01 ENCOUNTER — Ambulatory Visit: Admitting: Sports Medicine

## 2023-09-01 ENCOUNTER — Encounter: Payer: Self-pay | Admitting: Family Medicine

## 2023-09-01 VITALS — BP 145/80 | HR 84 | Temp 99.2°F | Ht 62.0 in | Wt 162.4 lb

## 2023-09-01 DIAGNOSIS — J029 Acute pharyngitis, unspecified: Secondary | ICD-10-CM

## 2023-09-01 DIAGNOSIS — J301 Allergic rhinitis due to pollen: Secondary | ICD-10-CM

## 2023-09-01 DIAGNOSIS — J020 Streptococcal pharyngitis: Secondary | ICD-10-CM | POA: Diagnosis not present

## 2023-09-01 LAB — POCT RAPID STREP A (OFFICE): Rapid Strep A Screen: POSITIVE — AB

## 2023-09-01 MED ORDER — AZITHROMYCIN 250 MG PO TABS: ORAL_TABLET | ORAL | 0 refills | Status: AC

## 2023-09-01 NOTE — Assessment & Plan Note (Signed)
 This may be adding to sinus and head symptoms  (also has strep throat) Encouraged to continue nasacort  Expectorant/dm for cough and congestion if needed Nasal saline Avoid pollen when able

## 2023-09-01 NOTE — Progress Notes (Signed)
 Subjective:    Patient ID: Tracy Blankenship, female    DOB: February 02, 1956, 68 y.o.   MRN: [id number]  HPI  Wt Readings from Last 3 Encounters:  09/01/23 162 lb 6 oz (73.7 kg)  08/06/23 163 lb 8 oz (74.2 kg)  08/05/23 166 lb (75.3 kg)   29.70 kg/m  Vitals:   09/01/23 1222 09/01/23 1240  BP: (!) 146/82 (!) 145/80  Pulse: 84   Temp: 99.2 F (37.3 C)   SpO2: 97%      Pt presents with c/o sore throat   Started yesterday am  Headache and head pressure  Sinuses full Ears feel funny / not painful   Nasal mucous is clear  Not a lot of sneezing  Little cough- not much     ST is mild so far   Is going out of town on Thursday   99.6 temp last night  No rashes   For allergies Uses nasacort ns and it does help    Positive strep today Results for orders placed or performed in visit on 09/01/23  Rapid Strep A   Collection Time: 09/01/23 12:34 PM  Result Value Ref Range   Rapid Strep A Screen Positive (A) Negative      Over the counter  Yesterday took day quil and ny quil (half doses)     BP Readings from Last 3 Encounters:  09/01/23 (!) 145/80  08/06/23 129/70  08/04/23 122/60   Today blood pressure may be up due to not feeling well  Patient Active Problem List   Diagnosis Date Noted   Strep throat 09/01/2023   Family history of heart disease 05/11/2023   Hearing loss 11/11/2022   Abnormal chest x-ray 08/05/2022   Elevated blood pressure reading 07/28/2022   History of colon polyps 07/22/2022   Estrogen deficiency 07/25/2020   Eczema 02/09/2017   Mild hyperlipidemia 02/08/2016   H/O herpes zoster 01/31/2014   Vaginal dryness 10/29/2012   Routine general medical examination at a health care facility 03/30/2011   Urinary incontinence 01/07/2011   Allergic rhinitis 12/21/2009   INTERSTITIAL CYSTITIS 12/08/2006   Prediabetes 12/08/2006   Past Medical History:  Diagnosis Date   Allergy    Chronic sinusitis    Constipation    Hyperglycemia    IC  (interstitial cystitis)    Plantar fasciitis    Pneumonia    Stress reaction    Trigger finger    Past Surgical History:  Procedure Laterality Date   APPENDECTOMY     CESAREAN SECTION     X 2   WISDOM TOOTH EXTRACTION     Social History   Tobacco Use   Smoking status: Never   Smokeless tobacco: Never  Vaping Use   Vaping status: Never Used  Substance Use Topics   Alcohol use: Yes    Alcohol/week: 0.0 standard drinks of alcohol    Comment: wine -every 3-4 months   Drug use: No   Family History  Problem Relation Age of Onset   Cardiomyopathy Father    Diabetes Father    Stroke Father    Anxiety disorder Mother    Lupus Mother    Emphysema Mother    Coronary artery disease Mother    Lung cancer Mother    Hypertension Mother    Heart disease Mother    COPD Mother    Kidney disease Mother    Colon cancer Neg Hx    Breast cancer Neg Hx    Colon polyps  Neg Hx    Esophageal cancer Neg Hx    Rectal cancer Neg Hx    Stomach cancer Neg Hx    Allergies  Allergen Reactions   Claritin [Loratadine] Other (See Comments)    Felt jittery   Penicillins     REACTION: ? allergy   Polymyxin B-Trimethoprim     Made eye swelling worse    Pseudoephedrine     REACTION: makes her hyper   Current Outpatient Medications on File Prior to Visit  Medication Sig Dispense Refill   Calcium Carbonate-Vit D-Min (CALCIUM 1200) 1200-1000 MG-UNIT CHEW Chew by mouth.     Cetirizine HCl 10 MG CAPS Take 1 capsule by mouth daily as needed.     famotidine (PEPCID) 20 MG tablet Take 1 tablet (20 mg total) by mouth 2 (two) times daily as needed for heartburn or indigestion. 60 tablet 11   Multiple Vitamin (MULTIVITAMIN) tablet Take 1 tablet by mouth daily.     polyethylene glycol powder (MIRALAX) 17 GM/SCOOP powder Take 1 Container by mouth daily as needed.     Triamcinolone Acetonide (NASACORT ALLERGY 24HR NA) Place 2 sprays into the nose daily.     No current facility-administered medications  on file prior to visit.    Review of Systems  Constitutional:  Positive for appetite change and fatigue. Negative for fever.  HENT:  Positive for congestion, postnasal drip, rhinorrhea, sinus pressure and sneezing. Negative for ear pain, trouble swallowing and voice change.   Eyes:  Negative for pain and discharge.  Respiratory:  Positive for cough. Negative for shortness of breath, wheezing and stridor.   Cardiovascular:  Negative for chest pain.  Gastrointestinal:  Negative for diarrhea, nausea and vomiting.  Genitourinary:  Negative for frequency, hematuria and urgency.  Musculoskeletal:  Negative for arthralgias and myalgias.  Skin:  Negative for rash.  Neurological:  Positive for headaches. Negative for dizziness, weakness and light-headedness.  Psychiatric/Behavioral:  Negative for confusion and dysphoric mood.        Objective:   Physical Exam Constitutional:      General: She is not in acute distress.    Appearance: Normal appearance. She is well-developed and normal weight. She is not ill-appearing, toxic-appearing or diaphoretic.  HENT:     Head: Normocephalic and atraumatic.     Comments: Nares are injected and congested      Right Ear: Tympanic membrane, ear canal and external ear normal.     Left Ear: Tympanic membrane, ear canal and external ear normal.     Nose: Congestion and rhinorrhea present.     Comments: Boggy nares     Mouth/Throat:     Mouth: Mucous membranes are moist.     Pharynx: Oropharynx is clear. Posterior oropharyngeal erythema present. No oropharyngeal exudate.     Comments: Clear pnd   Mild posterior erythema of throat  No exudate or swelling Eyes:     General:        Right eye: No discharge.        Left eye: No discharge.     Conjunctiva/sclera: Conjunctivae normal.     Pupils: Pupils are equal, round, and reactive to light.  Cardiovascular:     Rate and Rhythm: Normal rate.     Heart sounds: Normal heart sounds.  Pulmonary:     Effort:  Pulmonary effort is normal. No respiratory distress.     Breath sounds: Normal breath sounds. No stridor. No wheezing, rhonchi or rales.  Chest:     Chest  wall: No tenderness.  Musculoskeletal:     Cervical back: Normal range of motion and neck supple.  Lymphadenopathy:     Cervical: No cervical adenopathy.  Skin:    General: Skin is warm and dry.     Capillary Refill: Capillary refill takes less than 2 seconds.     Findings: No rash.  Neurological:     Mental Status: She is alert.     Cranial Nerves: No cranial nerve deficit.  Psychiatric:        Mood and Affect: Mood normal.           Assessment & Plan:   Problem List Items Addressed This Visit       Respiratory   Strep throat - Primary   With ST and low grade temp at home   Positive test today  Encouraged fluids Azithromycin prescription given (is all to pcn)  Disc symptomatic care - see instructions on AVS  Of note also has allergy or uri symptoms   Update if not starting to improve in a week or if worsening  Call back and Er precautions noted in detail today        Relevant Medications   azithromycin (ZITHROMAX) 250 MG tablet   Allergic rhinitis   This may be adding to sinus and head symptoms  (also has strep throat) Encouraged to continue nasacort  Expectorant/dm for cough and congestion if needed Nasal saline Avoid pollen when able         Other Visit Diagnoses       Sore throat       Relevant Orders   Rapid Strep A (Completed)

## 2023-09-01 NOTE — Patient Instructions (Addendum)
 Your strep test is positive  Allergies may also be in play or you may have a cold  Take azitrho (zpack) for strep If sore throat and fever do not improve within a few days let us know   Drink fluids and rest  Continue the nasacort  mucinex DM is good for cough and congestion (or dayquil/nyquil)  Nasal saline for congestion as needed  Tylenol for fever or pain or headache  Please alert [place] if symptoms worsen (if severe or short of breath please go to the ER)    Update if not starting to improve in a week or if worsening

## 2023-09-01 NOTE — Assessment & Plan Note (Signed)
 With ST and low grade temp at home   Positive test today  Encouraged fluids Azithromycin prescription given (is all to pcn)  Disc symptomatic care - see instructions on AVS  Of note also has allergy or uri symptoms   Update if not starting to improve in a week or if worsening  Call back and Er precautions noted in detail today

## 2023-09-16 ENCOUNTER — Ambulatory Visit (INDEPENDENT_AMBULATORY_CARE_PROVIDER_SITE_OTHER)

## 2023-09-16 ENCOUNTER — Ambulatory Visit: Admitting: Family Medicine

## 2023-09-16 ENCOUNTER — Other Ambulatory Visit: Payer: Self-pay

## 2023-09-16 VITALS — BP 168/90 | HR 79 | Ht 62.0 in | Wt 163.0 lb

## 2023-09-16 DIAGNOSIS — M79662 Pain in left lower leg: Secondary | ICD-10-CM

## 2023-09-16 DIAGNOSIS — G8929 Other chronic pain: Secondary | ICD-10-CM

## 2023-09-16 DIAGNOSIS — M1712 Unilateral primary osteoarthritis, left knee: Secondary | ICD-10-CM | POA: Diagnosis not present

## 2023-09-16 DIAGNOSIS — M25562 Pain in left knee: Secondary | ICD-10-CM

## 2023-09-16 DIAGNOSIS — M25552 Pain in left hip: Secondary | ICD-10-CM | POA: Diagnosis not present

## 2023-09-16 NOTE — Progress Notes (Signed)
 Tracy Muck, PhD, LAT, ATC acting as a scribe for Tracy Juniper, MD.  Tracy Blankenship is a 68 y.o. female who presents to [facility] today for knee pain. Pt was previously seen by Dr. Cleora Daft on 08/04/23 for L hip pain.  Today, pt c/o L knee pain ongoing since March, worsening. Pt locates pain to posterior aspect of her L knee and into the proximal calf. Very intermittent feeling of instability. Pain is starting to affect ADLs. Pain limited w/ knee flexion.  L Knee swelling: no Mechanical symptoms: no Aggravates: steps, walking, getting in/out car and tub Treatments tried HEP, meloxicam ,   Pertinent review of systems: No fevers or chills  Relevant historical information: Hyperlipidemia   Exam:  BP (!) 168/90   Pulse 79   Ht 5\' 2"  (1.575 m)   Wt 163 lb (73.9 kg)   SpO2 98%   BMI 29.81 kg/m  General: Well Developed, well nourished, and in no acute distress.   MSK: Left knee mild swelling around the knee and posterior knee.  Nontender palpation anterior knee.  Mildly tender palpation posterior medial knee.  Motion palpation proximal calf.  No visible calf swelling.  Normal foot and ankle motion normal knee motion. Stable ligamentous exam knee.    Lab and Radiology Results  Procedure: Real-time Ultrasound Guided Injection of left knee superior lateral patellar space Device: Philips Affiniti 50G/GE Logiq Images permanently stored and available for review in PACS Ultrasound evaluation prior to injection reveals no acute abnormalities.  No Baker's cyst.  No severe degenerative changes. Verbal informed consent obtained.  Discussed risks and benefits of procedure. Warned about infection, bleeding, hyperglycemia damage to structures among others. Patient expresses understanding and agreement Time-out conducted.   Noted no overlying erythema, induration, or other signs of local infection.   Skin prepped in a sterile fashion.   Local anesthesia:  Topical Ethyl chloride.   With sterile technique and under real time ultrasound guidance: 40 mg of Kenalog  and 2 mL's of Marcaine injected into knee joint. Fluid seen entering the joint capsule.   Completed without difficulty   Pain immediately resolved suggesting accurate placement of the medication.   Advised to call if fevers/chills, erythema, induration, drainage, or persistent bleeding.   Images permanently stored and available for review in the ultrasound unit.  Impression: Technically successful ultrasound guided injection.    X-ray images left knee obtained today personally and independently interpreted. Trace degenerative changes.  No acute fractures. Await formal radiology review     Assessment and Plan: 68 y.o. female with significant left knee pain.  This is also associate with some calf pain and swelling.  Ultrasound and x-ray examination of the knee was not very remarkable.  Plan for interarticular steroid injection and vascular ultrasound to evaluate for DVT.  DVT is less likely but should be evaluated more thoroughly.  Additionally will refer to physical therapy for the knee pain as well as the hip pain that she recently saw Dr. Cleora Daft for.  This could be quite helpful.   PDMP not reviewed this encounter. Orders Placed This Encounter  Procedures   US  LIMITED JOINT SPACE STRUCTURES LOW LEFT(NO LINKED CHARGES)    Reason for Exam (SYMPTOM  OR DIAGNOSIS REQUIRED):   left knee pain    Preferred imaging location?:   [facility] Knee AP/LAT W/[place] Left    Standing Status:   Future    Number of Occurrences:   1  Expiration Date:   10/16/2023    Reason for Exam (SYMPTOM  OR DIAGNOSIS REQUIRED):   left knee pain    Preferred imaging location?:   [facility]   Ambulatory referral to Physical Therapy    Referral Priority:   Routine    Referral Type:   Physical Medicine    Referral Reason:   Specialty Services Required    Requested  Specialty:   Physical Therapy    Number of Visits Requested:   1   No orders of the defined types were placed in this encounter.    Discussed warning signs or symptoms. Please see discharge instructions. Patient expresses understanding.   The above documentation has been reviewed and is accurate and complete Tracy Blankenship, M.D.

## 2023-09-16 NOTE — Patient Instructions (Addendum)
 Thank you for coming in today.   Please get an Xray today before you leave   I've referred you to Physical Therapy.  Let us  know if you don't hear from them in one week.   You have a Vascular Ultrasound scheduled for:  [facility] [address] [place], [place] [address] Phone: [phone]

## 2023-09-17 NOTE — Progress Notes (Signed)
 Left knee x-ray shows a tiny bit of arthritis underneath the kneecap.

## 2023-09-18 ENCOUNTER — Ambulatory Visit: Admitting: Sports Medicine

## 2023-09-21 ENCOUNTER — Ambulatory Visit (HOSPITAL_COMMUNITY)
Admission: RE | Admit: 2023-09-21 | Discharge: 2023-09-21 | Disposition: A | Discharge status: Home / Self Care | Source: Ambulatory Visit | Attending: Surgery | Admitting: Surgery

## 2023-09-21 DIAGNOSIS — M79662 Pain in left lower leg: Secondary | ICD-10-CM | POA: Insufficient documentation

## 2023-09-21 NOTE — Progress Notes (Signed)
 No DVT is seen on the ultrasound.

## 2023-09-22 ENCOUNTER — Ambulatory Visit: Admitting: Sports Medicine

## 2023-10-07 ENCOUNTER — Ambulatory Visit: Attending: Family Medicine | Admitting: Physical Therapy

## 2023-10-07 ENCOUNTER — Other Ambulatory Visit: Payer: Self-pay

## 2023-10-07 ENCOUNTER — Encounter: Payer: Self-pay | Admitting: Physical Therapy

## 2023-10-07 DIAGNOSIS — M25552 Pain in left hip: Secondary | ICD-10-CM | POA: Diagnosis not present

## 2023-10-07 DIAGNOSIS — M6281 Muscle weakness (generalized): Secondary | ICD-10-CM | POA: Insufficient documentation

## 2023-10-07 DIAGNOSIS — M25562 Pain in left knee: Secondary | ICD-10-CM | POA: Insufficient documentation

## 2023-10-07 DIAGNOSIS — M79605 Pain in left leg: Secondary | ICD-10-CM | POA: Diagnosis not present

## 2023-10-07 DIAGNOSIS — G8929 Other chronic pain: Secondary | ICD-10-CM | POA: Insufficient documentation

## 2023-10-07 DIAGNOSIS — R2681 Unsteadiness on feet: Secondary | ICD-10-CM | POA: Insufficient documentation

## 2023-10-07 NOTE — Therapy (Signed)
 OUTPATIENT PHYSICAL THERAPY LOWER EXTREMITY EVALUATION   Patient Name: Tracy Blankenship MRN: [id number] DOB:06-Jul-1955, 68 y.o., female Today's Date: 10/07/2023  END OF SESSION:  PT End of Session - 10/07/23 1153     Visit Number 1    Number of Visits 13    Authorization Type [employer]    Authorization Time Period 10/07/23 to 10/29/23    Progress Note Due on Visit 10    PT Start Time 1145    PT Stop Time 1227    PT Time Calculation (min) 42 min    Activity Tolerance Patient tolerated treatment well    Behavior During Therapy WFL for tasks assessed/performed             Past Medical History:  Diagnosis Date   Allergy    Chronic sinusitis    Constipation    Hyperglycemia    IC (interstitial cystitis)    Plantar fasciitis    Pneumonia    Stress reaction    Trigger finger    Past Surgical History:  Procedure Laterality Date   APPENDECTOMY     CESAREAN SECTION     X 2   WISDOM TOOTH EXTRACTION     Patient Active Problem List   Diagnosis Date Noted   Strep throat 09/01/2023   Family history of heart disease 05/11/2023   Hearing loss 11/11/2022   Abnormal chest x-ray 08/05/2022   Elevated blood pressure reading 07/28/2022   History of colon polyps 07/22/2022   Estrogen deficiency 07/25/2020   Eczema 02/09/2017   Mild hyperlipidemia 02/08/2016   H/O herpes zoster 01/31/2014   Vaginal dryness 10/29/2012   Routine general medical examination at a health care facility 03/30/2011   Urinary incontinence 01/07/2011   Allergic rhinitis 12/21/2009   INTERSTITIAL CYSTITIS 12/08/2006   Prediabetes 12/08/2006    PCP: Tracy Fleet MD   REFERRING PROVIDER: Syliva Even, MD  REFERRING DIAG: Diagnosis [phone] (ICD-10-CM) - Chronic pain of left knee M25.552 (ICD-10-CM) - Left hip pain  THERAPY DIAG:  Pain in left leg  Muscle weakness (generalized)  Unsteadiness on feet  Rationale for Evaluation and Treatment: Rehabilitation  ONSET DATE: 3 months ago    SUBJECTIVE:   SUBJECTIVE STATEMENT:  Pain started in my hip a couple of months, xray was ok and ended up doing some hip PT but after that I started having some terrible pain in my left knee. Everything functional like steps and getting out of car was very painful, ended up going back to MD office and saw Dr. Alease Blankenship instead of Dr. Cleora Blankenship who did US  and gave cortisone shot. Shot has helped tremendously but I know when it wears off it will give me problems again. I've also been having issues with spasms and sharp pains in the front of my left hip. 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 Work with plants too, similar activities. Got exercises from [facility], they gave me quad stretch, HS stretches, SKTC, figure four, hip abduction, clam shells (from [facility] per pt handout, not from formal PT). Have always had issues with getting up easily from the floor Blankenship before pain started.   PERTINENT HISTORY:  See above  PAIN:  Are you having pain? No now, got up to 7-8/10 before shot   PRECAUTIONS: None  RED FLAGS: None   WEIGHT  BEARING RESTRICTIONS: No  FALLS:  Has patient fallen in last 6 months? No  LIVING ENVIRONMENT: Lives with: lives with their family Lives in: House/apartment   OCCUPATION: retired but works with [employer], used to teach high school   PLOF: Independent, Independent with basic ADLs, Independent with gait, and Independent with transfers  PATIENT GOALS: be pain free with all functional activities, be able to get up off the floor, improve balance   NEXT MD VISIT: PRN with Dr. Alease Blankenship   OBJECTIVE:  Note: Objective measures were completed at Evaluation unless otherwise noted.  DIAGNOSTIC FINDINGS:     Lower Venous DVT Study   Patient Name:  Tracy Blankenship  Date of Exam:   09/21/2023  Medical Rec #: [id number]        Accession #:    [id number]  Date of Birth: Aug 21, 1955      Patient Gender: F  Patient Age:   36 years  Exam Location:  [facility]  Procedure:      VAS US  LOWER EXTREMITY VENOUS (DVT)  Referring Phys: Tracy Blankenship    ---------------------------------------------------------------------------  -----    Performing Technologist: Tracy Blankenship     Examination Guidelines:  A complete evaluation includes B-mode Blankenship, spectral Doppler, color  Doppler,  and power Doppler as needed of all accessible portions of each vessel.  Bilateral  testing is considered an integral part of a complete examination. Limited  examinations for reoccurring indications may be performed as noted. The  reflux  portion of the exam is performed with the patient in reverse  Trendelenburg.        +---------+---------------+---------+-----------+----------+--------------+   LEFT    CompressibilityPhasicitySpontaneityPropertiesThrombus  Aging  +---------+---------------+---------+-----------+----------+--------------+   CFV     Full                                                          +---------+---------------+---------+-----------+----------+--------------+   SFJ     Full                                                          +---------+---------------+---------+-----------+----------+--------------+   FV Prox  Full                                                          +---------+---------------+---------+-----------+----------+--------------+   FV Mid   Full                                                          +---------+---------------+---------+-----------+----------+--------------+   FV DistalFull                                                          +---------+---------------+---------+-----------+----------+--------------+  POP     Full                                                           +---------+---------------+---------+-----------+----------+--------------+   PTV     Full                                                          +---------+---------------+---------+-----------+----------+--------------+   PERO    Full                                                          +---------+---------------+---------+-----------+----------+--------------+   GSV     Full                                                          +---------+---------------+---------+-----------+----------+--------------+             Findings reported to routed in epic at 11:38 am.    Summary:  RIGHT:    - There is no evidence of deep vein thrombosis in the lower extremity.  - There is no evidence of superficial venous thrombosis.    LEFT:  - No evidence of common femoral vein obstruction.    CLINICAL DATA:  Chronic hip pain.   EXAM: DG HIP (WITH OR WITHOUT PELVIS) 3V LEFT   COMPARISON:  None Available.   FINDINGS: There is no evidence of hip fracture or dislocation. Pelvic ring is intact. There is no evidence of arthropathy or other focal bone abnormality.   IMPRESSION: Negative.  CLINICAL DATA:  Left knee pain for 3-4 weeks.  No known inj   EXAM: LEFT KNEE 3 VIEWS   COMPARISON:  None Available.   FINDINGS: No evidence of fracture, dislocation, or joint effusion. The alignment and joint spaces are normal. Minor peripheral spurring in the patellofemoral compartment. No erosions or focal bone abnormality. Soft tissues are unremarkable.   IMPRESSION: Minor degenerative peripheral spurring in the patellofemoral compartment.  PATIENT SURVEYS:    Patient-Specific Activity Scoring Scheme  "0" represents "unable to perform." "10" represents "able to perform at prior level. 0 1 2 3 4 5 6 7 8 9  10 (Date and Score)   Activity Eval     1. Getting up off the floor   8 "generous"     2. Steps   8 "fearful"     3. Getting in/out of garden  tub  7   4.    5.    Score 7.6    Total score = sum of the activity scores/number of activities Minimum detectable change (90%CI) for average score = 2 points Minimum detectable change (90%CI) for single activity score = 3 points     COGNITION: Overall cognitive status: Within functional limits for tasks assessed  SENSATION: Not tested    MUSCLE LENGTH:  HS severe limitation B, piriformis mod limitation L/mild R    PALPATION:  HS and proximal gastroc tender L LE     LOWER EXTREMITY MMT:  MMT Right eval Left eval  Hip flexion 4+ 4+  Hip extension 3+ 3+  Hip abduction 4+ 4+  Hip adduction    Hip internal rotation    Hip external rotation    Knee flexion 4 4  Knee extension 4+ 4+  Ankle dorsiflexion 4+ 4+  Ankle plantarflexion    Ankle inversion    Ankle eversion     (Blank rows = not tested)    FUNCTIONAL TESTS:  5 times sit to stand: 14 seconds no UEs  SLS 7 seconds R, 10 seconds L                                                                                                                                    TREATMENT DATE:   10/07/23  Exam findings, POC, HEP   Nustep L4 x6 minutes all four extremities  HEP instruction and practice as below     PATIENT EDUCATION:  Education details: exam findings, POC, HEP  Person educated: Patient Education method: Explanation, Demonstration, and Handouts Education comprehension: verbalized understanding, returned demonstration, and needs further education  HOME EXERCISE PROGRAM: Access Code: [facility] URL: [email or web address] Date: 10/07/2023 Prepared by: Terrel Ferries  Exercises - Supine Bridge with Resistance Band  - 1 x daily - 5 x weekly - 2 sets - 10 reps - 2 seconds  hold - Standing Hamstring Curl with Resistance  - 1 x daily - 5 x weekly - 2 sets - 10 reps - 2 seconds   hold - Hooklying Hamstring Stretch with Strap  - 1 x daily - 7 x weekly - 1 sets - 3 reps - 30  hold  ASSESSMENT:  CLINICAL IMPRESSION: Patient is a 68 y.o. F who was seen today for physical therapy evaluation and treatment for Diagnosis M25.562,G89.29 (ICD-10-CM) - Chronic pain of left knee M25.552 (ICD-10-CM) - Left hip pain. She does have a strong anterior chain bias and fairly limited strength in posterior chain musculature, also flexibility impairments and mild balance deficit. Anticipate she will benefit from skilled PT services to address all concerns and optimize pain free function.   OBJECTIVE IMPAIRMENTS: decreased activity tolerance, decreased balance, decreased mobility, difficulty walking, decreased ROM, decreased strength, impaired flexibility, improper body mechanics, and pain.   ACTIVITY LIMITATIONS: carrying, lifting, bending, standing, stairs, transfers, and locomotion level  PARTICIPATION LIMITATIONS: meal prep, cleaning, laundry, driving, shopping, community activity, occupation, and yard work  PERSONAL FACTORS: Age, Fitness, Past/current experiences, Profession, and Time since onset of injury/illness/exacerbation are also affecting patient's functional outcome.   REHAB POTENTIAL: Good  CLINICAL DECISION MAKING: Stable/uncomplicated  EVALUATION COMPLEXITY: Low   GOALS: Goals reviewed with patient? No  SHORT TERM GOALS: Target date: 10/28/2023  Will be compliant with appropriate progressive HEP  Baseline: Goal status: INITIAL  2.  Will complete 5xSTS in 10 seconds or less no offshifting away from pain LE  Baseline:  Goal status: INITIAL  3.  Hamstring, piriformis, and hip flexor flexibility impairments to have resolved  Baseline:  Goal status: INITIAL    LONG TERM GOALS: Target date: 11/18/2023    MMT to have improved by one grade in all weak groups  Baseline:  Goal status: INITIAL  2.  Will be able to climb stairs reciprocally without increase in pain  Baseline:  Goal status: INITIAL  3.  Will be able to perform all antiques related job  tasks without increase in pain and with good biomechanics  Baseline:  Goal status: INITIAL  4.  Will be able to maintain SLS for 20 seconds B to show improved functional balance  Baseline:  Goal status: INITIAL  5.  Will be able to get down to/up from the floor without increase in pain  Baseline:  Goal status: INITIAL  6. PSFS to have improved to 10 Goal status: INITIAL      PLAN:  PT FREQUENCY: 2x/week  PT DURATION: 6 weeks  PLANNED INTERVENTIONS: 97110-Therapeutic exercises, 97530- Therapeutic activity, 97112- Neuromuscular re-education, 97535- Self Care, 17494- Manual therapy, and 97033- Ionotophoresis 4mg /ml Dexamethasone  PLAN FOR NEXT SESSION: strengthen posterior chain, work on flexibility and biomechanics, manual/DN as desired  Terrel Ferries, PT, DPT 10/07/23 1:00 PM

## 2023-10-08 ENCOUNTER — Ambulatory Visit: Admitting: Physical Therapy

## 2023-10-08 DIAGNOSIS — M25552 Pain in left hip: Secondary | ICD-10-CM | POA: Diagnosis not present

## 2023-10-08 DIAGNOSIS — R2681 Unsteadiness on feet: Secondary | ICD-10-CM | POA: Diagnosis not present

## 2023-10-08 DIAGNOSIS — M79605 Pain in left leg: Secondary | ICD-10-CM

## 2023-10-08 DIAGNOSIS — M25562 Pain in left knee: Secondary | ICD-10-CM | POA: Diagnosis not present

## 2023-10-08 DIAGNOSIS — M6281 Muscle weakness (generalized): Secondary | ICD-10-CM | POA: Diagnosis not present

## 2023-10-08 DIAGNOSIS — G8929 Other chronic pain: Secondary | ICD-10-CM | POA: Diagnosis not present

## 2023-10-08 NOTE — Therapy (Signed)
 OUTPATIENT PHYSICAL THERAPY LOWER EXTREMITY    Patient Name: JENNESIS BLAISDELL MRN: [id number] DOB:1955/08/20, 68 y.o., female Today's Date: 10/08/2023  END OF SESSION:  PT End of Session - 10/08/23 1010     Visit Number 2    Number of Visits 13    Authorization Time Period 10/07/23 to 10/29/23    PT Start Time 1010    PT Stop Time 1055    PT Time Calculation (min) 45 min             Past Medical History:  Diagnosis Date   Allergy    Chronic sinusitis    Constipation    Hyperglycemia    IC (interstitial cystitis)    Plantar fasciitis    Pneumonia    Stress reaction    Trigger finger    Past Surgical History:  Procedure Laterality Date   APPENDECTOMY     CESAREAN SECTION     X 2   WISDOM TOOTH EXTRACTION     Patient Active Problem List   Diagnosis Date Noted   Strep throat 09/01/2023   Family history of heart disease 05/11/2023   Hearing loss 11/11/2022   Abnormal chest x-ray 08/05/2022   Elevated blood pressure reading 07/28/2022   History of colon polyps 07/22/2022   Estrogen deficiency 07/25/2020   Eczema 02/09/2017   Mild hyperlipidemia 02/08/2016   H/O herpes zoster 01/31/2014   Vaginal dryness 10/29/2012   Routine general medical examination at a health care facility 03/30/2011   Urinary incontinence 01/07/2011   Allergic rhinitis 12/21/2009   INTERSTITIAL CYSTITIS 12/08/2006   Prediabetes 12/08/2006    PCP: Deri Fleet MD   REFERRING PROVIDER: Syliva Even, MD  REFERRING DIAG: Diagnosis [phone] (ICD-10-CM) - Chronic pain of left knee M25.552 (ICD-10-CM) - Left hip pain  THERAPY DIAG:  Pain in left leg  Muscle weakness (generalized)  Rationale for Evaluation and Treatment: Rehabilitation  ONSET DATE: 3 months ago   SUBJECTIVE:   SUBJECTIVE STATEMENT:  Just here yesterday, so no changes  Pain started in my hip a couple of months, xray was ok and ended up doing some hip PT but after that I started having some terrible pain  in my left knee. Everything functional like steps and getting out of car was very painful, ended up going back to MD office and saw Dr. Alease Hunter instead of Dr. Cleora Daft who did US  and gave cortisone shot. Shot has helped tremendously but I know when it wears off it will give me problems again. I've also been having issues with spasms and sharp pains in the front of my left hip. 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 Work with plants too, similar activities. Got exercises from [facility], they gave me quad stretch, HS stretches, SKTC, figure four, hip abduction, clam shells (from [facility] per pt handout, not from formal PT). Have always had issues with getting up easily from the floor even before pain started.   PERTINENT HISTORY:  See above  PAIN:  Are you having pain? No now, got up to 7-8/10 before shot   PRECAUTIONS: None  RED FLAGS: None   WEIGHT BEARING RESTRICTIONS: No  FALLS:  Has patient fallen in last 6 months? No  LIVING ENVIRONMENT: Lives with: lives with their family Lives in: House/apartment   OCCUPATION: retired but  works with [employer], used to teach high school   PLOF: Independent, Independent with basic ADLs, Independent with gait, and Independent with transfers  PATIENT GOALS: be pain free with all functional activities, be able to get up off the floor, improve balance   NEXT MD VISIT: PRN with Dr. Alease Hunter   OBJECTIVE:  Note: Objective measures were completed at Evaluation unless otherwise noted.  DIAGNOSTIC FINDINGS:     Lower Venous DVT Study   Patient Name:  ADALYNN KELM  Date of Exam:   09/21/2023  Medical Rec #: [id number]       Accession #:    [id number]  Date of Birth: 1955-12-01      Patient Gender: F  Patient Age:   67 years  Exam Location:  [facility]  Procedure:      VAS US  LOWER  EXTREMITY VENOUS (DVT)  Referring Phys: Garlan Juniper    ---------------------------------------------------------------------------  -----    Performing Technologist: Parke Boll RVS, RCS     Examination Guidelines:  A complete evaluation includes B-mode imaging, spectral Doppler, color  Doppler,  and power Doppler as needed of all accessible portions of each vessel.  Bilateral  testing is considered an integral part of a complete examination. Limited  examinations for reoccurring indications may be performed as noted. The  reflux  portion of the exam is performed with the patient in reverse  Trendelenburg.        +---------+---------------+---------+-----------+----------+--------------+   LEFT    CompressibilityPhasicitySpontaneityPropertiesThrombus  Aging  +---------+---------------+---------+-----------+----------+--------------+   CFV     Full                                                          +---------+---------------+---------+-----------+----------+--------------+   SFJ     Full                                                          +---------+---------------+---------+-----------+----------+--------------+   FV Prox  Full                                                          +---------+---------------+---------+-----------+----------+--------------+   FV Mid   Full                                                          +---------+---------------+---------+-----------+----------+--------------+   FV DistalFull                                                          +---------+---------------+---------+-----------+----------+--------------+   POP     Full                                                          +---------+---------------+---------+-----------+----------+--------------+  PTV     Full                                                           +---------+---------------+---------+-----------+----------+--------------+   PERO    Full                                                          +---------+---------------+---------+-----------+----------+--------------+   GSV     Full                                                          +---------+---------------+---------+-----------+----------+--------------+             Findings reported to routed in epic at 11:38 am.    Summary:  RIGHT:    - There is no evidence of deep vein thrombosis in the lower extremity.  - There is no evidence of superficial venous thrombosis.    LEFT:  - No evidence of common femoral vein obstruction.    CLINICAL DATA:  Chronic hip pain.   EXAM: DG HIP (WITH OR WITHOUT PELVIS) 3V LEFT   COMPARISON:  None Available.   FINDINGS: There is no evidence of hip fracture or dislocation. Pelvic ring is intact. There is no evidence of arthropathy or other focal bone abnormality.   IMPRESSION: Negative.  CLINICAL DATA:  Left knee pain for 3-4 weeks.  No known inj   EXAM: LEFT KNEE 3 VIEWS   COMPARISON:  None Available.   FINDINGS: No evidence of fracture, dislocation, or joint effusion. The alignment and joint spaces are normal. Minor peripheral spurring in the patellofemoral compartment. No erosions or focal bone abnormality. Soft tissues are unremarkable.   IMPRESSION: Minor degenerative peripheral spurring in the patellofemoral compartment.  PATIENT SURVEYS:    Patient-Specific Activity Scoring Scheme  "0" represents "unable to perform." "10" represents "able to perform at prior level. 0 1 2 3 4 5 6 7 8 9  10 (Date and Score)   Activity Eval     1. Getting up off the floor   8 "generous"     2. Steps   8 "fearful"     3. Getting in/out of garden tub  7   4.    5.    Score 7.6    Total score = sum of the activity scores/number of activities Minimum detectable change (90%CI) for average score = 2  points Minimum detectable change (90%CI) for single activity score = 3 points     COGNITION: Overall cognitive status: Within functional limits for tasks assessed     SENSATION: Not tested    MUSCLE LENGTH:  HS severe limitation B, piriformis mod limitation L/mild R    PALPATION:  HS and proximal gastroc tender L LE     LOWER EXTREMITY MMT:  MMT Right eval Left eval  Hip flexion 4+ 4+  Hip extension 3+ 3+  Hip abduction 4+ 4+  Hip adduction    Hip  internal rotation    Hip external rotation    Knee flexion 4 4  Knee extension 4+ 4+  Ankle dorsiflexion 4+ 4+  Ankle plantarflexion    Ankle inversion    Ankle eversion     (Blank rows = not tested)    FUNCTIONAL TESTS:  5 times sit to stand: 14 seconds no UEs  SLS 7 seconds R, 10 seconds L                                                                                                                                    TREATMENT DATE:   10/08/23  Bike L 3 5 min Resisted Gait 30# 4 ways 5 x each 6 inch step up with opp leg ext 10 x each  Leg Press 20# 3 sets 10 feet 3 ways ( tried 30# but too heavy for multi reps) Calf Raises 30# 2 sets 15 STS with wt ball press 10 x Supine feet on ball bridge 10 x SL, 10x bent knee Red tband SLR 10x BIL the SLR with abd 10 x Black bar heel raise 15x then toe raise 15 x Calf stretch on step gastroc and soleus      10/07/23  Exam findings, POC, HEP   Nustep L4 x6 minutes all four extremities  HEP instruction and practice as below     PATIENT EDUCATION:  Education details: exam findings, POC, HEP  Person educated: Patient Education method: Explanation, Demonstration, and Handouts Education comprehension: verbalized understanding, returned demonstration, and needs further education  HOME EXERCISE PROGRAM: Access Code: [facility] URL: [email or web address] Date: 10/07/2023 Prepared by: Terrel Ferries  Exercises - Supine Bridge with  Resistance Band  - 1 x daily - 5 x weekly - 2 sets - 10 reps - 2 seconds  hold - Standing Hamstring Curl with Resistance  - 1 x daily - 5 x weekly - 2 sets - 10 reps - 2 seconds   hold - Hooklying Hamstring Stretch with Strap  - 1 x daily - 7 x weekly - 1 sets - 3 reps - 30 hold  ASSESSMENT:  CLINICAL IMPRESSION:  1st session after eval that was just yesterday,initiated LE strengthening with focus on hips and posterior chain strengthening with cuing. Pt states she Felt calf raises the most but weakness noted the most with resisted gait requiring min A to stab.    Patient is a 68 y.o. F who was seen today for physical therapy evaluation and treatment for Diagnosis M25.562,G89.29 (ICD-10-CM) - Chronic pain of left knee M25.552 (ICD-10-CM) - Left hip pain. She does have a strong anterior chain bias and fairly limited strength in posterior chain musculature, also flexibility impairments and mild balance deficit. Anticipate she will benefit from skilled PT services to address all concerns and optimize pain free function.   OBJECTIVE IMPAIRMENTS: decreased activity tolerance, decreased balance, decreased mobility, difficulty walking, decreased ROM, decreased strength, impaired flexibility, improper body  mechanics, and pain.   ACTIVITY LIMITATIONS: carrying, lifting, bending, standing, stairs, transfers, and locomotion level  PARTICIPATION LIMITATIONS: meal prep, cleaning, laundry, driving, shopping, community activity, occupation, and yard work  PERSONAL FACTORS: Age, Fitness, Past/current experiences, Profession, and Time since onset of injury/illness/exacerbation are also affecting patient's functional outcome.   REHAB POTENTIAL: Good  CLINICAL DECISION MAKING: Stable/uncomplicated  EVALUATION COMPLEXITY: Low   GOALS: Goals reviewed with patient? No  SHORT TERM GOALS: Target date: 10/28/2023   Will be compliant with appropriate progressive HEP  Baseline: Goal status: INITIAL  2.  Will  complete 5xSTS in 10 seconds or less no offshifting away from pain LE  Baseline:  Goal status: INITIAL  3.  Hamstring, piriformis, and hip flexor flexibility impairments to have resolved  Baseline:  Goal status: INITIAL    LONG TERM GOALS: Target date: 11/18/2023    MMT to have improved by one grade in all weak groups  Baseline:  Goal status: INITIAL  2.  Will be able to climb stairs reciprocally without increase in pain  Baseline:  Goal status: INITIAL  3.  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  Will be able to maintain SLS for 20 seconds B to show improved functional balance  Baseline:  Goal status: INITIAL  5.  Will be able to get down to/up from the floor without increase in pain  Baseline:  Goal status: INITIAL  6. PSFS to have improved to 10 Goal status: INITIAL      PLAN:  PT FREQUENCY: 2x/week  PT DURATION: 6 weeks  PLANNED INTERVENTIONS: 97110-Therapeutic exercises, 97530- Therapeutic activity, 97112- Neuromuscular re-education, 97535- Self Care, 16109- Manual therapy, and 97033- Ionotophoresis 4mg /ml Dexamethasone  PLAN FOR NEXT SESSION: strengthen posterior chain, work on flexibility and biomechanics, manual/DN as desired   Patient Details  Name: TIYANA KERKSTRA MRN: [id number] Date of Birth: 1955-08-28 Referring Provider:  Syliva Even, MD  Encounter Date: 10/08/2023   Aquilla Bayley, PTA 10/08/2023, 10:11 AM  [place] [address] [place], [place], [address] Phone: [phone]   Fax:  [phone]

## 2023-10-12 DIAGNOSIS — L812 Freckles: Secondary | ICD-10-CM | POA: Diagnosis not present

## 2023-10-12 DIAGNOSIS — D2261 Melanocytic nevi of right upper limb, including shoulder: Secondary | ICD-10-CM | POA: Diagnosis not present

## 2023-10-12 DIAGNOSIS — Z85828 Personal history of other malignant neoplasm of skin: Secondary | ICD-10-CM | POA: Diagnosis not present

## 2023-10-12 DIAGNOSIS — L821 Other seborrheic keratosis: Secondary | ICD-10-CM | POA: Diagnosis not present

## 2023-10-12 DIAGNOSIS — D2262 Melanocytic nevi of left upper limb, including shoulder: Secondary | ICD-10-CM | POA: Diagnosis not present

## 2023-10-12 DIAGNOSIS — L738 Other specified follicular disorders: Secondary | ICD-10-CM | POA: Diagnosis not present

## 2023-10-12 DIAGNOSIS — L720 Epidermal cyst: Secondary | ICD-10-CM | POA: Diagnosis not present

## 2023-10-12 DIAGNOSIS — D225 Melanocytic nevi of trunk: Secondary | ICD-10-CM | POA: Diagnosis not present

## 2023-10-14 ENCOUNTER — Ambulatory Visit

## 2023-10-14 DIAGNOSIS — M6281 Muscle weakness (generalized): Secondary | ICD-10-CM | POA: Diagnosis not present

## 2023-10-14 DIAGNOSIS — R2681 Unsteadiness on feet: Secondary | ICD-10-CM

## 2023-10-14 DIAGNOSIS — M79605 Pain in left leg: Secondary | ICD-10-CM | POA: Diagnosis not present

## 2023-10-14 DIAGNOSIS — M25552 Pain in left hip: Secondary | ICD-10-CM | POA: Diagnosis not present

## 2023-10-14 DIAGNOSIS — M25562 Pain in left knee: Secondary | ICD-10-CM | POA: Diagnosis not present

## 2023-10-14 DIAGNOSIS — G8929 Other chronic pain: Secondary | ICD-10-CM | POA: Diagnosis not present

## 2023-10-14 NOTE — Therapy (Signed)
 OUTPATIENT PHYSICAL THERAPY LOWER EXTREMITY    Patient Name: Tracy Blankenship MRN: [id number] DOB:[birth year], 68 y.o., female Today's Date: 10/14/2023  END OF SESSION:  PT End of Session - 10/14/23 1516     Visit Number 3    Number of Visits 13    Authorization Time Period 10/07/23 to 10/29/23    PT Start Time 1516    PT Stop Time 1600    PT Time Calculation (min) 44 min              Past Medical History:  Diagnosis Date   Allergy    Chronic sinusitis    Constipation    Hyperglycemia    IC (interstitial cystitis)    Plantar fasciitis    Pneumonia    Stress reaction    Trigger finger    Past Surgical History:  Procedure Laterality Date   APPENDECTOMY     CESAREAN SECTION     X 2   WISDOM TOOTH EXTRACTION     Patient Active Problem List   Diagnosis Date Noted   Strep throat 09/01/2023   Family history of heart disease 05/11/2023   Hearing loss 11/11/2022   Abnormal chest x-ray 08/05/2022   Elevated blood pressure reading 07/28/2022   History of colon polyps 07/22/2022   Estrogen deficiency 07/25/2020   Eczema 02/09/2017   Mild hyperlipidemia 02/08/2016   H/O herpes zoster 01/31/2014   Vaginal dryness 10/29/2012   Routine general medical examination at a health care facility 03/30/2011   Urinary incontinence 01/07/2011   Allergic rhinitis 12/21/2009   INTERSTITIAL CYSTITIS 12/08/2006   Prediabetes 12/08/2006    PCP: Deri Fleet MD   REFERRING PROVIDER: Syliva Even, MD  REFERRING DIAG: Diagnosis [phone] (ICD-10-CM) - Chronic pain of left knee M25.552 (ICD-10-CM) - Left hip pain  THERAPY DIAG:  Muscle weakness (generalized)  Unsteadiness on feet  Rationale for Evaluation and Treatment: Rehabilitation  ONSET DATE: 3 months ago   SUBJECTIVE:   SUBJECTIVE STATEMENT: Doing good, no pain. The shot seemed to help.    Pain started in my hip a couple of months, xray was ok and ended up doing some hip PT but after that I started having  some terrible pain in my left knee. Everything functional like steps and getting out of car was very painful, ended up going back to MD office and saw Dr. Alease Hunter instead of Dr. Cleora Daft who did US  and gave cortisone shot. Shot has helped tremendously but I know when it wears off it will give me problems again. I've also been having issues with spasms and sharp pains in the front of my left hip. 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 Work with plants too, similar activities. Got exercises from [facility], they gave me quad stretch, HS stretches, SKTC, figure four, hip abduction, clam shells (from [facility] per pt handout, not from formal PT). Have always had issues with getting up easily from the floor even before pain started.   PERTINENT HISTORY:  See above  PAIN:  Are you having pain? No now, got up to 7-8/10 before shot   PRECAUTIONS: None  RED FLAGS: None   WEIGHT BEARING RESTRICTIONS: No  FALLS:  Has patient fallen in last 6 months? No  LIVING ENVIRONMENT: Lives with: lives with their family Lives in: House/apartment  OCCUPATION: retired but works with [employer], used to teach high school   PLOF: Independent, Independent with basic ADLs, Independent with gait, and Independent with transfers  PATIENT GOALS: be pain free with all functional activities, be able to get up off the floor, improve balance   NEXT MD VISIT: PRN with Dr. Alease Hunter   OBJECTIVE:  Note: Objective measures were completed at Evaluation unless otherwise noted.  DIAGNOSTIC FINDINGS:     Lower Venous DVT Study   Patient Name:  Tracy Blankenship  Date of Exam:   09/21/2023  Medical Rec #: [id number]       Accession #:    [id number]  Date of Birth: 1956-03-08      Patient Gender: F  Patient Age:   66 years  Exam Location:  [facility]  Procedure:       VAS US  LOWER EXTREMITY VENOUS (DVT)  Referring Phys: Garlan Juniper    ---------------------------------------------------------------------------  -----    Performing Technologist: Parke Boll RVS, RCS     Examination Guidelines:  A complete evaluation includes B-mode imaging, spectral Doppler, color  Doppler,  and power Doppler as needed of all accessible portions of each vessel.  Bilateral  testing is considered an integral part of a complete examination. Limited  examinations for reoccurring indications may be performed as noted. The  reflux  portion of the exam is performed with the patient in reverse  Trendelenburg.        +---------+---------------+---------+-----------+----------+--------------+   LEFT    CompressibilityPhasicitySpontaneityPropertiesThrombus  Aging  +---------+---------------+---------+-----------+----------+--------------+   CFV     Full                                                          +---------+---------------+---------+-----------+----------+--------------+   SFJ     Full                                                          +---------+---------------+---------+-----------+----------+--------------+   FV Prox  Full                                                          +---------+---------------+---------+-----------+----------+--------------+   FV Mid   Full                                                          +---------+---------------+---------+-----------+----------+--------------+   FV DistalFull                                                          +---------+---------------+---------+-----------+----------+--------------+   POP     Full                                                          +---------+---------------+---------+-----------+----------+--------------+  PTV     Full                                                           +---------+---------------+---------+-----------+----------+--------------+   PERO    Full                                                          +---------+---------------+---------+-----------+----------+--------------+   GSV     Full                                                          +---------+---------------+---------+-----------+----------+--------------+             Findings reported to routed in epic at 11:38 am.    Summary:  RIGHT:    - There is no evidence of deep vein thrombosis in the lower extremity.  - There is no evidence of superficial venous thrombosis.    LEFT:  - No evidence of common femoral vein obstruction.    CLINICAL DATA:  Chronic hip pain.   EXAM: DG HIP (WITH OR WITHOUT PELVIS) 3V LEFT   COMPARISON:  None Available.   FINDINGS: There is no evidence of hip fracture or dislocation. Pelvic ring is intact. There is no evidence of arthropathy or other focal bone abnormality.   IMPRESSION: Negative.  CLINICAL DATA:  Left knee pain for 3-4 weeks.  No known inj   EXAM: LEFT KNEE 3 VIEWS   COMPARISON:  None Available.   FINDINGS: No evidence of fracture, dislocation, or joint effusion. The alignment and joint spaces are normal. Minor peripheral spurring in the patellofemoral compartment. No erosions or focal bone abnormality. Soft tissues are unremarkable.   IMPRESSION: Minor degenerative peripheral spurring in the patellofemoral compartment.  PATIENT SURVEYS:    Patient-Specific Activity Scoring Scheme  "0" represents "unable to perform." "10" represents "able to perform at prior level. 0 1 2 3 4 5 6 7 8 9  10 (Date and Score)   Activity Eval     1. Getting up off the floor   8 "generous"     2. Steps   8 "fearful"     3. Getting in/out of garden tub  7   4.    5.    Score 7.6    Total score = sum of the activity scores/number of activities Minimum detectable change (90%CI) for average score = 2  points Minimum detectable change (90%CI) for single activity score = 3 points     COGNITION: Overall cognitive status: Within functional limits for tasks assessed     SENSATION: Not tested    MUSCLE LENGTH:  HS severe limitation B, piriformis mod limitation L/mild R    PALPATION:  HS and proximal gastroc tender L LE     LOWER EXTREMITY MMT:  MMT Right eval Left eval  Hip flexion 4+ 4+  Hip extension 3+ 3+  Hip abduction 4+ 4+  Hip adduction    Hip  internal rotation    Hip external rotation    Knee flexion 4 4  Knee extension 4+ 4+  Ankle dorsiflexion 4+ 4+  Ankle plantarflexion    Ankle inversion    Ankle eversion     (Blank rows = not tested)    FUNCTIONAL TESTS:  5 times sit to stand: 14 seconds no UEs  SLS 7 seconds R, 10 seconds L                                                                                                                                    TREATMENT DATE:  10/14/23 Walk outdoors around back building  NuStep L5x19mins  Leg ext 5# 2x10 HS curls 20# 2x10 Calf stretch 15s x2 Calf raises 2x10 2 way hip 5# cable 2x10 STS w/OHP 2x10  10/08/23  Bike L 3 5 min Resisted Gait 30# 4 ways 5 x each 6 inch step up with opp leg ext 10 x each  Leg Press 20# 3 sets 10 feet 3 ways ( tried 30# but too heavy for multi reps) Calf Raises 30# 2 sets 15 STS with wt ball press 10 x Supine feet on ball bridge 10 x SL, 10x bent knee Red tband SLR 10x BIL the SLR with abd 10 x Black bar heel raise 15x then toe raise 15 x Calf stretch on step gastroc and soleus      10/07/23  Exam findings, POC, HEP   Nustep L4 x6 minutes all four extremities  HEP instruction and practice as below     PATIENT EDUCATION:  Education details: exam findings, POC, HEP  Person educated: Patient Education method: Explanation, Demonstration, and Handouts Education comprehension: verbalized understanding, returned demonstration, and needs further  education  HOME EXERCISE PROGRAM: Access Code: [facility] URL: [email or web address] Date: 10/07/2023 Prepared by: Terrel Ferries  Exercises - Supine Bridge with Resistance Band  - 1 x daily - 5 x weekly - 2 sets - 10 reps - 2 seconds  hold - Standing Hamstring Curl with Resistance  - 1 x daily - 5 x weekly - 2 sets - 10 reps - 2 seconds   hold - Hooklying Hamstring Stretch with Strap  - 1 x daily - 7 x weekly - 1 sets - 3 reps - 30 hold  ASSESSMENT:  CLINICAL IMPRESSION:  patient returns doing well, reports no pain and things have gotten easier. Continued with LE strengthening with machine level interventions. Some burning and fatigue in hip with 2 way cable kicks. She feels that although most of the pain is gone, she wants to make sure that once the Cortizone shot wears off that it does not return.     Patient is a 68 y.o. F who was seen today for physical therapy evaluation and treatment for Diagnosis M25.562,G89.29 (ICD-10-CM) - Chronic pain of left knee M25.552 (ICD-10-CM) - Left hip pain. She does have a strong anterior chain bias and  fairly limited strength in posterior chain musculature, also flexibility impairments and mild balance deficit. Anticipate she will benefit from skilled PT services to address all concerns and optimize pain free function.   OBJECTIVE IMPAIRMENTS: decreased activity tolerance, decreased balance, decreased mobility, difficulty walking, decreased ROM, decreased strength, impaired flexibility, improper body mechanics, and pain.   ACTIVITY LIMITATIONS: carrying, lifting, bending, standing, stairs, transfers, and locomotion level  PARTICIPATION LIMITATIONS: meal prep, cleaning, laundry, driving, shopping, community activity, occupation, and yard work  PERSONAL FACTORS: Age, Fitness, Past/current experiences, Profession, and Time since onset of injury/illness/exacerbation are also affecting patient's functional outcome.   REHAB POTENTIAL:  Good  CLINICAL DECISION MAKING: Stable/uncomplicated  EVALUATION COMPLEXITY: Low   GOALS: Goals reviewed with patient? No  SHORT TERM GOALS: Target date: 10/28/2023   Will be compliant with appropriate progressive HEP  Baseline: Goal status: MET 10/14/23  2.  Will complete 5xSTS in 10 seconds or less no offshifting away from pain LE  Baseline:  Goal status: INITIAL  3.  Hamstring, piriformis, and hip flexor flexibility impairments to have resolved  Baseline:  Goal status: INITIAL    LONG TERM GOALS: Target date: 11/18/2023    MMT to have improved by one grade in all weak groups  Baseline:  Goal status: INITIAL  2.  Will be able to climb stairs reciprocally without increase in pain  Baseline:  Goal status: MET 10/14/23  3.  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  Will be able to maintain SLS for 20 seconds B to show improved functional balance  Baseline:  Goal status: INITIAL  5.  Will be able to get down to/up from the floor without increase in pain  Baseline:  Goal status: INITIAL  6. PSFS to have improved to 10 Goal status: INITIAL      PLAN:  PT FREQUENCY: 2x/week  PT DURATION: 6 weeks  PLANNED INTERVENTIONS: 97110-Therapeutic exercises, 97530- Therapeutic activity, 97112- Neuromuscular re-education, 97535- Self Care, 16109- Manual therapy, and 97033- Ionotophoresis 4mg /ml Dexamethasone  PLAN FOR NEXT SESSION: strengthen posterior chain, work on flexibility and biomechanics, manual/DN as desired   Patient Details  Name: RYELEE ALBEE MRN: [id number] Date of Birth: 1956/04/09 Referring Provider:  Syliva Even, MD  Encounter Date: 10/14/2023   Donavon Fudge, PT 10/14/2023, 4:01 PM  [facility] [address] [place], [place], [address] Phone: [phone]   Fax:  [phone]

## 2023-10-16 ENCOUNTER — Ambulatory Visit (INDEPENDENT_AMBULATORY_CARE_PROVIDER_SITE_OTHER): Admitting: Nurse Practitioner

## 2023-10-16 VITALS — BP 148/76 | HR 82 | Temp 98.5°F | Ht 62.0 in | Wt 161.2 lb

## 2023-10-16 DIAGNOSIS — J029 Acute pharyngitis, unspecified: Secondary | ICD-10-CM | POA: Diagnosis not present

## 2023-10-16 NOTE — Assessment & Plan Note (Signed)
 Strep and COVID test in office.  Both were negative.  Continue over-the-counter symptomatic treatment.  Did encourage patient to start taking her Zyrtec every night continue Nasacort .  She can take small frequent sips of water along with using hard candy and salt water gargle.  Follow-up if no improvement

## 2023-10-16 NOTE — Progress Notes (Signed)
 Acute Office Visit  Subjective:     Patient ID: Tracy Blankenship, female    DOB: 04/25/1956, 68 y.o.   MRN: [id number]  Chief Complaint  Patient presents with   Sore Throat    Pt complains of scratchy throat that started 3 days ago. Complains of no other symptoms. Pt was around grandaughter whos in daycare     HPI Patient is in today for throat with a history of strep throat, allergic rhinits Eczema, Prediabetes, hld.  Covid vaccine: original and booster Flu vaccine: up to date  Sick contacts: patient was around her granddaughter that is in daycare, but not actively. States symptoms started 3-4 days ago. States that it is not a sore throat. States that he back feels scracty. States that she ate breakfast and has looked and nothing there but feels like there is something there She has tried warm salt water garlges and listerine. She has tried hard candy. She has tried tylenol. None of that has helped    Review of Systems  Constitutional:  Negative for chills, fever and weight loss.  HENT:  Negative for ear discharge, ear pain, sinus pain and sore throat.   Respiratory:  Negative for cough and shortness of breath.   Cardiovascular:  Negative for chest pain.  Gastrointestinal:  Negative for abdominal pain, diarrhea, nausea and vomiting.  Musculoskeletal:  Negative for myalgias.  Neurological:  Negative for dizziness and headaches.        Objective:    BP (!) 148/76   Pulse 82   Temp 98.5 F (36.9 C) (Oral)   Ht 5\' 2"  (1.575 m)   Wt 161 lb 3.2 oz (73.1 kg)   SpO2 98%   BMI 29.48 kg/m  BP Readings from Last 3 Encounters:  10/16/23 (!) 148/76  09/16/23 (!) 168/90  09/01/23 (!) 145/80   Wt Readings from Last 3 Encounters:  10/16/23 161 lb 3.2 oz (73.1 kg)  09/16/23 163 lb (73.9 kg)  09/01/23 162 lb 6 oz (73.7 kg)   SpO2 Readings from Last 3 Encounters:  10/16/23 98%  09/16/23 98%  09/01/23 97%      Physical Exam Vitals and nursing note reviewed.   Constitutional:      Appearance: Normal appearance.  HENT:     Right Ear: Tympanic membrane, ear canal and external ear normal.     Left Ear: Tympanic membrane, ear canal and external ear normal.     Mouth/Throat:     Mouth: Mucous membranes are moist.     Pharynx: Oropharynx is clear. Posterior oropharyngeal erythema (mild) present.  Cardiovascular:     Rate and Rhythm: Normal rate and regular rhythm.     Heart sounds: Normal heart sounds.  Pulmonary:     Effort: Pulmonary effort is normal.     Breath sounds: Normal breath sounds.  Lymphadenopathy:     Cervical: No cervical adenopathy.  Neurological:     Mental Status: She is alert.     No results found for any visits on 10/16/23.      Assessment & Plan:   Problem List Items Addressed This Visit       Other   Sore throat - Primary   Strep and COVID test in office.  Both were negative.  Continue over-the-counter symptomatic treatment.  Did encourage patient to start taking her Zyrtec every night continue Nasacort .  She can take small frequent sips of water along with using hard candy and salt water gargle.  Follow-up if no  improvement       No orders of the defined types were placed in this encounter.   Return if symptoms worsen or fail to improve.  Margarie Shay, NP

## 2023-10-16 NOTE — Patient Instructions (Signed)
 Covid and strep test was negative in office I recommend that you start taking the cetirizine at night for the next week. Continue with the nasacort  Small, frequent sips of fluid. Hard candy and salt water gargles as you see fit

## 2023-10-20 ENCOUNTER — Ambulatory Visit: Payer: Self-pay

## 2023-10-20 NOTE — Telephone Encounter (Signed)
 Schedule to see Dr. Cherlyn Cornet 10/21/2023 at 11:20 am.

## 2023-10-20 NOTE — Telephone Encounter (Signed)
Noted. Will see.

## 2023-10-20 NOTE — Telephone Encounter (Signed)
  Chief Complaint: urinary frequency Symptoms: urinary frequency, urgency, odor, incontinence at times  Frequency: over 1 week  Pertinent Negatives: NA Disposition: [] ED /[] Urgent Care (no appt availability in office) / [x] Appointment(In office/virtual)/ []  [facility]/ [] Home Care/ [] Refused Recommended Disposition /[] [facility]/ []  Follow-up with PCP Additional Notes: scheduled appt for tomorrow 1120 with Dr. Cherlyn Cornet. Care advice given and pt verbalized understanding.   Copied from CRM [id number]. Topic: Appointments - Appointment Scheduling >> Oct 20, 2023  8:36 AM Emylou G wrote: Urine sample?  Odor, urine urgency.. Reason for Disposition  Urinating more frequently than usual (i.e., frequency)  Answer Assessment - Initial Assessment Questions 1. SYMPTOM: "What's the main symptom you're concerned about?" (e.g., frequency, incontinence)     Odor and urgency 2. ONSET: "When did the  sx  start?"     Over 1 week  3. PAIN: "Is there any pain?" If Yes, ask: "How bad is it?" (Scale: 1-10; mild, moderate, severe)     Mild  4. CAUSE: "What do you think is causing the symptoms?"     Possible UTI  5. OTHER SYMPTOMS: "Do you have any other symptoms?" (e.g., blood in urine, fever, flank pain, pain with urination)     Urgency and odor  Protocols used: Urinary Symptoms-A-AH

## 2023-10-20 NOTE — Telephone Encounter (Signed)
Aware, will watch for correspondence Thanks

## 2023-10-21 ENCOUNTER — Encounter: Payer: Self-pay | Admitting: Physical Therapy

## 2023-10-21 ENCOUNTER — Encounter: Payer: Self-pay | Admitting: Family Medicine

## 2023-10-21 ENCOUNTER — Ambulatory Visit (INDEPENDENT_AMBULATORY_CARE_PROVIDER_SITE_OTHER): Admitting: Family Medicine

## 2023-10-21 ENCOUNTER — Ambulatory Visit: Admitting: Physical Therapy

## 2023-10-21 VITALS — BP 138/82 | HR 79 | Temp 99.1°F | Ht 62.0 in | Wt 161.4 lb

## 2023-10-21 DIAGNOSIS — M25562 Pain in left knee: Secondary | ICD-10-CM | POA: Diagnosis not present

## 2023-10-21 DIAGNOSIS — M25552 Pain in left hip: Secondary | ICD-10-CM | POA: Diagnosis not present

## 2023-10-21 DIAGNOSIS — R350 Frequency of micturition: Secondary | ICD-10-CM | POA: Insufficient documentation

## 2023-10-21 DIAGNOSIS — M6281 Muscle weakness (generalized): Secondary | ICD-10-CM

## 2023-10-21 DIAGNOSIS — R2681 Unsteadiness on feet: Secondary | ICD-10-CM | POA: Diagnosis not present

## 2023-10-21 DIAGNOSIS — M79605 Pain in left leg: Secondary | ICD-10-CM

## 2023-10-21 DIAGNOSIS — G8929 Other chronic pain: Secondary | ICD-10-CM | POA: Diagnosis not present

## 2023-10-21 LAB — POC URINALSYSI DIPSTICK (AUTOMATED)
Bilirubin, UA: NEGATIVE
Blood, UA: NEGATIVE
Glucose, UA: NEGATIVE
Ketones, UA: NEGATIVE
Leukocytes, UA: NEGATIVE
Nitrite, UA: NEGATIVE
Protein, UA: NEGATIVE
Spec Grav, UA: 1.02 (ref 1.010–1.025)
Urobilinogen, UA: 0.2 U/dL
pH, UA: 6 (ref 5.0–8.0)

## 2023-10-21 NOTE — Therapy (Signed)
 OUTPATIENT PHYSICAL THERAPY LOWER EXTREMITY    Patient Name: Tracy Blankenship MRN: [id number] DOB:[birth year], 68 y.o., female Today's Date: 10/21/2023  END OF SESSION:  PT End of Session - 10/21/23 1345     Visit Number 4    Authorization Time Period 10/07/23 to 10/29/23    PT Start Time 1345    PT Stop Time 1430    PT Time Calculation (min) 45 min    Activity Tolerance Patient tolerated treatment well    Behavior During Therapy WFL for tasks assessed/performed              Past Medical History:  Diagnosis Date   Allergy    Chronic sinusitis    Constipation    Hyperglycemia    IC (interstitial cystitis)    Plantar fasciitis    Pneumonia    Stress reaction    Trigger finger    Past Surgical History:  Procedure Laterality Date   APPENDECTOMY     CESAREAN SECTION     X 2   WISDOM TOOTH EXTRACTION     Patient Active Problem List   Diagnosis Date Noted   Urinary frequency 10/21/2023   Sore throat 10/16/2023   Strep throat 09/01/2023   Family history of heart disease 05/11/2023   Hearing loss 11/11/2022   Abnormal chest x-ray 08/05/2022   Elevated blood pressure reading 07/28/2022   History of colon polyps 07/22/2022   Estrogen deficiency 07/25/2020   Eczema 02/09/2017   Mild hyperlipidemia 02/08/2016   H/O herpes zoster 01/31/2014   Vaginal dryness 10/29/2012   Routine general medical examination at a health care facility 03/30/2011   Urinary incontinence 01/07/2011   Allergic rhinitis 12/21/2009   INTERSTITIAL CYSTITIS 12/08/2006   Prediabetes 12/08/2006    PCP: Deri Fleet MD   REFERRING PROVIDER: Syliva Even, MD  REFERRING DIAG: Diagnosis [phone] (ICD-10-CM) - Chronic pain of left knee M25.552 (ICD-10-CM) - Left hip pain  THERAPY DIAG:  Muscle weakness (generalized)  Unsteadiness on feet  Pain in left leg  Rationale for Evaluation and Treatment: Rehabilitation  ONSET DATE: 3 months ago   SUBJECTIVE:   SUBJECTIVE  STATEMENT: Doing ok,    Pain started in my hip a couple of months, xray was ok and ended up doing some hip PT but after that I started having some terrible pain in my left knee. Everything functional like steps and getting out of car was very painful, ended up going back to MD office and saw Dr. Alease Hunter instead of Dr. Cleora Daft who did US  and gave cortisone shot. Shot has helped tremendously but I know when it wears off it will give me problems again. I've also been having issues with spasms and sharp pains in the front of my left hip. 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 Work with plants too, similar activities. Got exercises from [facility], they gave me quad stretch, HS stretches, SKTC, figure four, hip abduction, clam shells (from [facility] per pt handout, not from formal PT). Have always had issues with getting up easily from the floor even before pain started.   PERTINENT HISTORY:  See above  PAIN:  Are you having pain? No   PRECAUTIONS: None  RED FLAGS: None   WEIGHT BEARING RESTRICTIONS: No  FALLS:  Has patient fallen in last 6 months? No  LIVING ENVIRONMENT: Lives with: lives with their family Lives in: House/apartment   OCCUPATION: retired but works with [employer], used to teach high school   PLOF: Independent, Independent with basic ADLs, Independent with gait, and Independent with transfers  PATIENT GOALS: be pain free with all functional activities, be able to get up off the floor, improve balance   NEXT MD VISIT: PRN with Dr. Alease Hunter   OBJECTIVE:  Note: Objective measures were completed at Evaluation unless otherwise noted.  DIAGNOSTIC FINDINGS:     Lower Venous DVT Study   Patient Name:  Tracy Blankenship  Date of Exam:   09/21/2023  Medical Rec #: [id number]       Accession #:    [id number]  Date of Birth:  09-18-1955      Patient Gender: F  Patient Age:   69 years  Exam Location:  [facility]  Procedure:      VAS US  LOWER EXTREMITY VENOUS (DVT)  Referring Phys: Garlan Juniper    ---------------------------------------------------------------------------  -----    Performing Technologist: Parke Boll RVS, RCS     Examination Guidelines:  A complete evaluation includes B-mode imaging, spectral Doppler, color  Doppler,  and power Doppler as needed of all accessible portions of each vessel.  Bilateral  testing is considered an integral part of a complete examination. Limited  examinations for reoccurring indications may be performed as noted. The  reflux  portion of the exam is performed with the patient in reverse  Trendelenburg.        +---------+---------------+---------+-----------+----------+--------------+   LEFT    CompressibilityPhasicitySpontaneityPropertiesThrombus  Aging  +---------+---------------+---------+-----------+----------+--------------+   CFV     Full                                                          +---------+---------------+---------+-----------+----------+--------------+   SFJ     Full                                                          +---------+---------------+---------+-----------+----------+--------------+   FV Prox  Full                                                          +---------+---------------+---------+-----------+----------+--------------+   FV Mid   Full                                                          +---------+---------------+---------+-----------+----------+--------------+   FV DistalFull                                                          +---------+---------------+---------+-----------+----------+--------------+  POP     Full                                                           +---------+---------------+---------+-----------+----------+--------------+   PTV     Full                                                          +---------+---------------+---------+-----------+----------+--------------+   PERO    Full                                                          +---------+---------------+---------+-----------+----------+--------------+   GSV     Full                                                          +---------+---------------+---------+-----------+----------+--------------+             Findings reported to routed in epic at 11:38 am.    Summary:  RIGHT:    - There is no evidence of deep vein thrombosis in the lower extremity.  - There is no evidence of superficial venous thrombosis.    LEFT:  - No evidence of common femoral vein obstruction.    CLINICAL DATA:  Chronic hip pain.   EXAM: DG HIP (WITH OR WITHOUT PELVIS) 3V LEFT   COMPARISON:  None Available.   FINDINGS: There is no evidence of hip fracture or dislocation. Pelvic ring is intact. There is no evidence of arthropathy or other focal bone abnormality.   IMPRESSION: Negative.  CLINICAL DATA:  Left knee pain for 3-4 weeks.  No known inj   EXAM: LEFT KNEE 3 VIEWS   COMPARISON:  None Available.   FINDINGS: No evidence of fracture, dislocation, or joint effusion. The alignment and joint spaces are normal. Minor peripheral spurring in the patellofemoral compartment. No erosions or focal bone abnormality. Soft tissues are unremarkable.   IMPRESSION: Minor degenerative peripheral spurring in the patellofemoral compartment.  PATIENT SURVEYS:    Patient-Specific Activity Scoring Scheme  "0" represents "unable to perform." "10" represents "able to perform at prior level. 0 1 2 3 4 5 6 7 8 9  10 (Date and Score)   Activity Eval     1. Getting up off the floor   8 "generous"     2. Steps   8 "fearful"     3. Getting in/out of garden  tub  7   4.    5.    Score 7.6    Total score = sum of the activity scores/number of activities Minimum detectable change (90%CI) for average score = 2 points Minimum detectable change (90%CI) for single activity score = 3 points     COGNITION: Overall cognitive status: Within functional limits for tasks assessed  SENSATION: Not tested    MUSCLE LENGTH:  HS severe limitation B, piriformis mod limitation L/mild R    PALPATION:  HS and proximal gastroc tender L LE     LOWER EXTREMITY MMT:  MMT Right eval Left eval  Hip flexion 4+ 4+  Hip extension 3+ 3+  Hip abduction 4+ 4+  Hip adduction    Hip internal rotation    Hip external rotation    Knee flexion 4 4  Knee extension 4+ 4+  Ankle dorsiflexion 4+ 4+  Ankle plantarflexion    Ankle inversion    Ankle eversion     (Blank rows = not tested)    FUNCTIONAL TESTS:  5 times sit to stand: 14 seconds no UEs  SLS 7 seconds R, 10 seconds L                                                                                                                                   TREATMENT DATE:  10/21/23 NuStep L5x 6 mins  Leg ext 5# 2x10 HS curls 20# 2x10 30lb resisted sides teps x 5 each  6in lateral step ups Hip add ball squeeze 2x10 S2S holding 7lb dumbbell 2x10 Slant board calf stretch    10/14/23 Walk outdoors around back building  NuStep L5x25mins  Leg ext 5# 2x10 HS curls 20# 2x10 Calf stretch 15s x2 Calf raises 2x10 2 way hip 5# cable 2x10 STS w/OHP 2x10  10/08/23  Bike L 3 5 min Resisted Gait 30# 4 ways 5 x each 6 inch step up with opp leg ext 10 x each  Leg Press 20# 3 sets 10 feet 3 ways ( tried 30# but too heavy for multi reps) Calf Raises 30# 2 sets 15 STS with wt ball press 10 x Supine feet on ball bridge 10 x SL, 10x bent knee Red tband SLR 10x BIL the SLR with abd 10 x Black bar heel raise 15x then toe raise 15 x Calf stretch on step gastroc and soleus      10/07/23  Exam  findings, POC, HEP   Nustep L4 x6 minutes all four extremities  HEP instruction and practice as below     PATIENT EDUCATION:  Education details: exam findings, POC, HEP  Person educated: Patient Education method: Explanation, Demonstration, and Handouts Education comprehension: verbalized understanding, returned demonstration, and needs further education  HOME EXERCISE PROGRAM: Access Code: [facility] URL: [email or web address] Date: 10/07/2023 Prepared by: Terrel Ferries  Exercises - Supine Bridge with Resistance Band  - 1 x daily - 5 x weekly - 2 sets - 10 reps - 2 seconds  hold - Standing Hamstring Curl with Resistance  - 1 x daily - 5 x weekly - 2 sets - 10 reps - 2 seconds   hold - Hooklying Hamstring Stretch with Strap  - 1 x daily - 7 x weekly - 1 sets - 3 reps - 30 hold  ASSESSMENT:  CLINICAL IMPRESSION:  again patient returns doing well, reports no pain during session. Continued with LE strengthening with machine level interventions. Cues for LE placement needed with lateral step ups. Pt did report a lateal thigh pull with resisted side steps. She feels that although most of the pain is gone, she wants to make sure that once the Cortizone shot wears off that it does not return.     Patient is a 68 y.o. F who was seen today for physical therapy evaluation and treatment for Diagnosis M25.562,G89.29 (ICD-10-CM) - Chronic pain of left knee M25.552 (ICD-10-CM) - Left hip pain. She does have a strong anterior chain bias and fairly limited strength in posterior chain musculature, also flexibility impairments and mild balance deficit. Anticipate she will benefit from skilled PT services to address all concerns and optimize pain free function.   OBJECTIVE IMPAIRMENTS: decreased activity tolerance, decreased balance, decreased mobility, difficulty walking, decreased ROM, decreased strength, impaired flexibility, improper body mechanics, and pain.   ACTIVITY LIMITATIONS:  carrying, lifting, bending, standing, stairs, transfers, and locomotion level  PARTICIPATION LIMITATIONS: meal prep, cleaning, laundry, driving, shopping, community activity, occupation, and yard work  PERSONAL FACTORS: Age, Fitness, Past/current experiences, Profession, and Time since onset of injury/illness/exacerbation are also affecting patient's functional outcome.   REHAB POTENTIAL: Good  CLINICAL DECISION MAKING: Stable/uncomplicated  EVALUATION COMPLEXITY: Low   GOALS: Goals reviewed with patient? No  SHORT TERM GOALS: Target date: 10/28/2023   Will be compliant with appropriate progressive HEP  Baseline: Goal status: MET 10/14/23  2.  Will complete 5xSTS in 10 seconds or less no offshifting away from pain LE  Baseline:  Goal status: INITIAL  3.  Hamstring, piriformis, and hip flexor flexibility impairments to have resolved  Baseline:  Goal status: INITIAL    LONG TERM GOALS: Target date: 11/18/2023    MMT to have improved by one grade in all weak groups  Baseline:  Goal status: INITIAL  2.  Will be able to climb stairs reciprocally without increase in pain  Baseline:  Goal status: MET 10/14/23  3.  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  Will be able to maintain SLS for 20 seconds B to show improved functional balance  Baseline:  Goal status: INITIAL  5.  Will be able to get down to/up from the floor without increase in pain  Baseline:  Goal status: INITIAL  6. PSFS to have improved to 10 Goal status: INITIAL      PLAN:  PT FREQUENCY: 2x/week  PT DURATION: 6 weeks  PLANNED INTERVENTIONS: 97110-Therapeutic exercises, 97530- Therapeutic activity, 97112- Neuromuscular re-education, 97535- Self Care, 16109- Manual therapy, and 97033- Ionotophoresis 4mg /ml Dexamethasone  PLAN FOR NEXT SESSION: strengthen posterior chain, work on flexibility and  biomechanics, manual/DN as desired   Ollen Beverage, PTA 10/21/2023, 1:45 PM

## 2023-10-21 NOTE — Progress Notes (Signed)
 Patient ID: Tracy Blankenship, female    DOB: 1955/10/30, 68 y.o.   MRN: [id number]  This visit was conducted in person.  BP 138/82   Pulse 79   Temp 99.1 F (37.3 C) (Oral)   Ht 5\' 2"  (1.575 m)   Wt 161 lb 6.4 oz (73.2 kg)   SpO2 97%   BMI 29.52 kg/m    CC:  Chief Complaint  Patient presents with   Urinary Tract Infection    More frequent, some odor no burning      Subjective:   HPI: Tracy Blankenship is a 68 y.o. female presenting on 10/21/2023 for Urinary Tract Infection (More frequent, some odor no burning  )  Urinary Tract Infection  This is a new problem. The current episode started 1 to 4 weeks ago. The problem has been gradually worsening. The patient is experiencing no pain. There has been no fever. She is Not sexually active. There is No history of pyelonephritis. Associated symptoms include frequency and urgency. Pertinent negatives include no chills, hematuria, nausea or vomiting. Associated symptoms comments: Mild odor change. She has tried nothing for the symptoms. The treatment provided no relief. There is no history of catheterization, kidney stones, recurrent UTIs, a single kidney, urinary stasis or a urological procedure. remote history of interstitial cystitis         Relevant past medical, surgical, family and social history reviewed and updated as indicated. Interim medical history since our last visit reviewed. Allergies and medications reviewed and updated. Outpatient Medications Prior to Visit  Medication Sig Dispense Refill   Calcium Carbonate-Vit D-Min (CALCIUM 1200) 1200-1000 MG-UNIT CHEW Chew by mouth.     Cetirizine HCl 10 MG CAPS Take 1 capsule by mouth daily as needed.     famotidine  (PEPCID ) 20 MG tablet Take 1 tablet (20 mg total) by mouth 2 (two) times daily as needed for heartburn or indigestion. 60 tablet 11   Multiple Vitamin (MULTIVITAMIN) tablet Take 1 tablet by mouth daily.     polyethylene glycol powder (MIRALAX) 17 GM/SCOOP powder Take 1  Container by mouth daily as needed.     Triamcinolone  Acetonide (NASACORT  ALLERGY 24HR NA) Place 2 sprays into the nose daily.     No facility-administered medications prior to visit.     Per HPI unless specifically indicated in ROS section below Review of Systems  Constitutional:  Negative for chills, fatigue and fever.  HENT:  Negative for ear pain.   Eyes:  Negative for pain.  Respiratory:  Negative for chest tightness and shortness of breath.   Cardiovascular:  Negative for chest pain, palpitations and leg swelling.  Gastrointestinal:  Negative for abdominal pain, nausea and vomiting.  Genitourinary:  Positive for frequency and urgency. Negative for decreased urine volume, dysuria, hematuria, pelvic pain, vaginal bleeding, vaginal discharge and vaginal pain.   Objective:  BP 138/82   Pulse 79   Temp 99.1 F (37.3 C) (Oral)   Ht 5\' 2"  (1.575 m)   Wt 161 lb 6.4 oz (73.2 kg)   SpO2 97%   BMI 29.52 kg/m   Wt Readings from Last 3 Encounters:  10/21/23 161 lb 6.4 oz (73.2 kg)  10/16/23 161 lb 3.2 oz (73.1 kg)  09/16/23 163 lb (73.9 kg)      Physical Exam Constitutional:      General: She is not in acute distress.    Appearance: Normal appearance. She is well-developed. She is not ill-appearing or toxic-appearing.  HENT:  Head: Normocephalic.     Right Ear: Hearing, tympanic membrane, ear canal and external ear normal. Tympanic membrane is not erythematous, retracted or bulging.     Left Ear: Hearing, tympanic membrane, ear canal and external ear normal. Tympanic membrane is not erythematous, retracted or bulging.     Nose: No mucosal edema or rhinorrhea.     Right Sinus: No maxillary sinus tenderness or frontal sinus tenderness.     Left Sinus: No maxillary sinus tenderness or frontal sinus tenderness.     Mouth/Throat:     Pharynx: Uvula midline.  Eyes:     General: Lids are normal. Lids are everted, no foreign bodies appreciated.     Conjunctiva/sclera: Conjunctivae  normal.     Pupils: Pupils are equal, round, and reactive to light.  Neck:     Thyroid : No thyroid  mass or thyromegaly.     Vascular: No carotid bruit.     Trachea: Trachea normal.  Cardiovascular:     Rate and Rhythm: Normal rate and regular rhythm.     Pulses: Normal pulses.     Heart sounds: Normal heart sounds, S1 normal and S2 normal. No murmur heard.    No friction rub. No gallop.  Pulmonary:     Effort: Pulmonary effort is normal. No tachypnea or respiratory distress.     Breath sounds: Normal breath sounds. No decreased breath sounds, wheezing, rhonchi or rales.  Abdominal:     General: Bowel sounds are normal.     Palpations: Abdomen is soft.     Tenderness: There is no abdominal tenderness.  Musculoskeletal:     Cervical back: Normal range of motion and neck supple.  Skin:    General: Skin is warm and dry.     Findings: No rash.  Neurological:     Mental Status: She is alert.  Psychiatric:        Mood and Affect: Mood is not anxious or depressed.        Speech: Speech normal.        Behavior: Behavior normal. Behavior is cooperative.        Thought Content: Thought content normal.        Judgment: Judgment normal.       Results for orders placed or performed in visit on 10/21/23  POCT Urinalysis Dipstick (Automated)   Collection Time: 10/21/23 12:08 PM  Result Value Ref Range   Color, UA Light Yellow    Clarity, UA clear    Glucose, UA Negative Negative   Bilirubin, UA negative    Ketones, UA negative    Spec Grav, UA 1.020 1.010 - 1.025   Blood, UA negative    pH, UA 6.0 5.0 - 8.0   Protein, UA Negative Negative   Urobilinogen, UA 0.2 0.2 or 1.0 E.U./dL   Nitrite, UA negative    Leukocytes, UA Negative Negative    Assessment and Plan  Urinary frequency Assessment & Plan: Acute, urinalysis clear on exam.  No white blood cells, nitrate or RBCs suggesting infection.  No glucose suggesting cause of increased frequency. Patient already avoiding bladder  irritants but does not drink much water.  Possible overactive bladder. Encourage patient to empty bladder regularly and push fluids.  Avoid bladder irritants.  If not improving as expected please follow-up.  Return ER precautions provided.  Orders: -     POCT Urinalysis Dipstick (Automated)    No follow-ups on file.   Herby Lolling, MD

## 2023-10-21 NOTE — Assessment & Plan Note (Signed)
 Acute, urinalysis clear on exam.  No white blood cells, nitrate or RBCs suggesting infection.  No glucose suggesting cause of increased frequency. Patient already avoiding bladder irritants but does not drink much water.  Possible overactive bladder. Encourage patient to empty bladder regularly and push fluids.  Avoid bladder irritants.  If not improving as expected please follow-up.  Return ER precautions provided.

## 2023-10-28 ENCOUNTER — Ambulatory Visit: Admitting: Physical Therapy

## 2023-10-30 ENCOUNTER — Ambulatory Visit: Attending: Family Medicine | Admitting: Physical Therapy

## 2023-10-30 ENCOUNTER — Encounter: Payer: Self-pay | Admitting: Physical Therapy

## 2023-10-30 DIAGNOSIS — M79605 Pain in left leg: Secondary | ICD-10-CM | POA: Diagnosis not present

## 2023-10-30 DIAGNOSIS — M6281 Muscle weakness (generalized): Secondary | ICD-10-CM | POA: Diagnosis not present

## 2023-10-30 DIAGNOSIS — R2681 Unsteadiness on feet: Secondary | ICD-10-CM

## 2023-10-30 NOTE — Therapy (Signed)
 OUTPATIENT PHYSICAL THERAPY LOWER EXTREMITY    Patient Name: Tracy Blankenship MRN: [id number] DOB:1955-08-04, 67 y.o., female Today's Date: 10/30/2023  END OF SESSION:  PT End of Session - 10/30/23 1052     Visit Number 5    Number of Visits 13    Authorization Time Period 10/07/23 to 10/29/23    PT Start Time 1055    PT Stop Time 1140    PT Time Calculation (min) 45 min    Activity Tolerance Patient tolerated treatment well    Behavior During Therapy WFL for tasks assessed/performed              Past Medical History:  Diagnosis Date   Allergy    Chronic sinusitis    Constipation    Hyperglycemia    IC (interstitial cystitis)    Plantar fasciitis    Pneumonia    Stress reaction    Trigger finger    Past Surgical History:  Procedure Laterality Date   APPENDECTOMY     CESAREAN SECTION     X 2   WISDOM TOOTH EXTRACTION     Patient Active Problem List   Diagnosis Date Noted   Urinary frequency 10/21/2023   Sore throat 10/16/2023   Strep throat 09/01/2023   Family history of heart disease 05/11/2023   Hearing loss 11/11/2022   Abnormal chest x-ray 08/05/2022   Elevated blood pressure reading 07/28/2022   History of colon polyps 07/22/2022   Estrogen deficiency 07/25/2020   Eczema 02/09/2017   Mild hyperlipidemia 02/08/2016   H/O herpes zoster 01/31/2014   Vaginal dryness 10/29/2012   Routine general medical examination at a health care facility 03/30/2011   Urinary incontinence 01/07/2011   Allergic rhinitis 12/21/2009   INTERSTITIAL CYSTITIS 12/08/2006   Prediabetes 12/08/2006    PCP: Deri Fleet MD   REFERRING PROVIDER: Syliva Even, MD  REFERRING DIAG: Diagnosis [id number] (ICD-10-CM) - Chronic pain of left knee M25.552 (ICD-10-CM) - Left hip pain  THERAPY DIAG:  Muscle weakness (generalized)  Unsteadiness on feet  Pain in left leg  Rationale for Evaluation and Treatment: Rehabilitation  ONSET DATE: 3 months ago   SUBJECTIVE:    SUBJECTIVE STATEMENT: "Good"   Pain started in my hip a couple of months, xray was ok and ended up doing some hip PT but after that I started having some terrible pain in my left knee. Everything functional like steps and getting out of car was very painful, ended up going back to MD office and saw Dr. Alease Hunter instead of Dr. Cleora Daft who did US  and gave cortisone shot. Shot has helped tremendously but I know when it wears off it will give me problems again. I've also been having issues with spasms and sharp pains in the front of my left hip. 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 Work with plants too, similar activities. Got exercises from [facility], they gave me quad stretch, HS stretches, SKTC, figure four, hip abduction, clam shells (from [facility] per pt handout, not from formal PT). Have always had issues with getting up easily from the floor even before pain started.   PERTINENT HISTORY:  See above  PAIN:  Are you having pain? No   PRECAUTIONS: None  RED FLAGS: None   WEIGHT BEARING RESTRICTIONS: No  FALLS:  Has patient fallen  in last 6 months? No  LIVING ENVIRONMENT: Lives with: lives with their family Lives in: House/apartment   OCCUPATION: retired but works with [employer], used to teach high school   PLOF: Independent, Independent with basic ADLs, Independent with gait, and Independent with transfers  PATIENT GOALS: be pain free with all functional activities, be able to get up off the floor, improve balance   NEXT MD VISIT: PRN with Dr. Alease Hunter   OBJECTIVE:  Note: Objective measures were completed at Evaluation unless otherwise noted.  DIAGNOSTIC FINDINGS:     Lower Venous DVT Study   Patient Name:  Tracy Blankenship  Date of Exam:   09/21/2023  Medical Rec #: [id number]       Accession #:    [id number]  Date of  Birth: 09-12-1955      Patient Gender: F  Patient Age:   56 years  Exam Location:  [facility]  Procedure:      VAS US  LOWER EXTREMITY VENOUS (DVT)  Referring Phys: Garlan Juniper    ---------------------------------------------------------------------------  -----    Performing Technologist: Parke Boll RVS, RCS     Examination Guidelines:  A complete evaluation includes B-mode imaging, spectral Doppler, color  Doppler,  and power Doppler as needed of all accessible portions of each vessel.  Bilateral  testing is considered an integral part of a complete examination. Limited  examinations for reoccurring indications may be performed as noted. The  reflux  portion of the exam is performed with the patient in reverse  Trendelenburg.        +---------+---------------+---------+-----------+----------+--------------+   LEFT    CompressibilityPhasicitySpontaneityPropertiesThrombus  Aging  +---------+---------------+---------+-----------+----------+--------------+   CFV     Full                                                          +---------+---------------+---------+-----------+----------+--------------+   SFJ     Full                                                          +---------+---------------+---------+-----------+----------+--------------+   FV Prox  Full                                                          +---------+---------------+---------+-----------+----------+--------------+   FV Mid   Full                                                          +---------+---------------+---------+-----------+----------+--------------+   FV DistalFull                                                          +---------+---------------+---------+-----------+----------+--------------+  POP     Full                                                           +---------+---------------+---------+-----------+----------+--------------+   PTV     Full                                                          +---------+---------------+---------+-----------+----------+--------------+   PERO    Full                                                          +---------+---------------+---------+-----------+----------+--------------+   GSV     Full                                                          +---------+---------------+---------+-----------+----------+--------------+             Findings reported to routed in epic at 11:38 am.    Summary:  RIGHT:    - There is no evidence of deep vein thrombosis in the lower extremity.  - There is no evidence of superficial venous thrombosis.    LEFT:  - No evidence of common femoral vein obstruction.    CLINICAL DATA:  Chronic hip pain.   EXAM: DG HIP (WITH OR WITHOUT PELVIS) 3V LEFT   COMPARISON:  None Available.   FINDINGS: There is no evidence of hip fracture or dislocation. Pelvic ring is intact. There is no evidence of arthropathy or other focal bone abnormality.   IMPRESSION: Negative.  CLINICAL DATA:  Left knee pain for 3-4 weeks.  No known inj   EXAM: LEFT KNEE 3 VIEWS   COMPARISON:  None Available.   FINDINGS: No evidence of fracture, dislocation, or joint effusion. The alignment and joint spaces are normal. Minor peripheral spurring in the patellofemoral compartment. No erosions or focal bone abnormality. Soft tissues are unremarkable.   IMPRESSION: Minor degenerative peripheral spurring in the patellofemoral compartment.  PATIENT SURVEYS:    Patient-Specific Activity Scoring Scheme  "0" represents "unable to perform." "10" represents "able to perform at prior level. 0 1 2 3 4 5 6 7 8 9  10 (Date and Score)   Activity Eval     1. Getting up off the floor   8 "generous"     2. Steps   8 "fearful"     3. Getting in/out of garden  tub  7   4.    5.    Score 7.6    Total score = sum of the activity scores/number of activities Minimum detectable change (90%CI) for average score = 2 points Minimum detectable change (90%CI) for single activity score = 3 points     COGNITION: Overall cognitive status: Within functional limits for tasks assessed  SENSATION: Not tested    MUSCLE LENGTH:  HS severe limitation B, piriformis mod limitation L/mild R    PALPATION:  HS and proximal gastroc tender L LE     LOWER EXTREMITY MMT:  MMT Right eval Left eval  Hip flexion 4+ 4+  Hip extension 3+ 3+  Hip abduction 4+ 4+  Hip adduction    Hip internal rotation    Hip external rotation    Knee flexion 4 4  Knee extension 4+ 4+  Ankle dorsiflexion 4+ 4+  Ankle plantarflexion    Ankle inversion    Ankle eversion     (Blank rows = not tested)    FUNCTIONAL TESTS:  5 times sit to stand: 14 seconds no UEs  SLS 7 seconds R, 10 seconds L                                                                                                                                   TREATMENT DATE:  10/30/23 NuStep L5x 6 mins  GOALS  LLE 7 sec, RLE 11 sec S2S OHP yellow ball 2x10  Leg ext 5# 2x10 HS curls 20# 2x10 Shoulder Ext 6lb 2x10 Lateral step ups x 10 each  10/21/23 NuStep L5x 6 mins  Leg ext 5# 2x10 HS curls 20# 2x10 30lb resisted sides teps x 5 each  6in lateral step ups Hip add ball squeeze 2x10 S2S holding 7lb dumbbell 2x10 Slant board calf stretch    10/14/23 Walk outdoors around back building  NuStep L5x62mins  Leg ext 5# 2x10 HS curls 20# 2x10 Calf stretch 15s x2 Calf raises 2x10 2 way hip 5# cable 2x10 STS w/OHP 2x10  10/08/23  Bike L 3 5 min Resisted Gait 30# 4 ways 5 x each 6 inch step up with opp leg ext 10 x each  Leg Press 20# 3 sets 10 feet 3 ways ( tried 30# but too heavy for multi reps) Calf Raises 30# 2 sets 15 STS with wt ball press 10 x Supine feet on ball bridge 10 x SL,  10x bent knee Red tband SLR 10x BIL the SLR with abd 10 x Black bar heel raise 15x then toe raise 15 x Calf stretch on step gastroc and soleus      10/07/23  Exam findings, POC, HEP   Nustep L4 x6 minutes all four extremities  HEP instruction and practice as below     PATIENT EDUCATION:  Education details: exam findings, POC, HEP  Person educated: Patient Education method: Explanation, Demonstration, and Handouts Education comprehension: verbalized understanding, returned demonstration, and needs further education  HOME EXERCISE PROGRAM: Access Code: [facility] URL: [email or web address] Date: 10/07/2023 Prepared by: Terrel Ferries  Exercises - Supine Bridge with Resistance Band  - 1 x daily - 5 x weekly - 2 sets - 10 reps - 2 seconds  hold - Standing Hamstring Curl with Resistance  - 1 x daily - 5 x  weekly - 2 sets - 10 reps - 2 seconds   hold - Hooklying Hamstring Stretch with Strap  - 1 x daily - 7 x weekly - 1 sets - 3 reps - 30 hold  ASSESSMENT:  CLINICAL IMPRESSION:  again patient returns doing well, reports no pain during session. Continued with LE strengthening with machine level interventions and functional activities. Cues for LE placement needed with lateral step ups. Pt has progressed meeting STG and well as progressed towards LTG.  She feels that although most of the pain is gone, she wants to make sure that once the Cortizone shot wears off that it does not return.     Patient is a 68 y.o. F who was seen today for physical therapy evaluation and treatment for Diagnosis M25.562,G89.29 (ICD-10-CM) - Chronic pain of left knee M25.552 (ICD-10-CM) - Left hip pain. She does have a strong anterior chain bias and fairly limited strength in posterior chain musculature, also flexibility impairments and mild balance deficit. Anticipate she will benefit from skilled PT services to address all concerns and optimize pain free function.   OBJECTIVE IMPAIRMENTS:  decreased activity tolerance, decreased balance, decreased mobility, difficulty walking, decreased ROM, decreased strength, impaired flexibility, improper body mechanics, and pain.   ACTIVITY LIMITATIONS: carrying, lifting, bending, standing, stairs, transfers, and locomotion level  PARTICIPATION LIMITATIONS: meal prep, cleaning, laundry, driving, shopping, community activity, occupation, and yard work  PERSONAL FACTORS: Age, Fitness, Past/current experiences, Profession, and Time since onset of injury/illness/exacerbation are also affecting patient's functional outcome.   REHAB POTENTIAL: Good  CLINICAL DECISION MAKING: Stable/uncomplicated  EVALUATION COMPLEXITY: Low   GOALS: Goals reviewed with patient? No  SHORT TERM GOALS: Target date: 10/28/2023   Will be compliant with appropriate progressive HEP  Baseline: Goal status: MET 10/14/23  2.  Will complete 5xSTS in 10 seconds or less no offshifting away from pain LE  Baseline:  Goal status: Met 7.74 sec  3.  Hamstring, piriformis, and hip flexor flexibility impairments to have resolved  Baseline:  Goal status: Met 10/30/23    LONG TERM GOALS: Target date: 11/18/2023    MMT to have improved by one grade in all weak groups  Baseline:  Goal status: INITIAL  2.  Will be able to climb stairs reciprocally without increase in pain  Baseline:  Goal status: MET 10/14/23  3.  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  Will be able to maintain SLS for 20 seconds B to show improved functional balance  Baseline:  Goal status: Ongoing 10/30/23  5.  Will be able to get down to/up from the floor without increase in pain  Baseline:  Goal status: Progressing 10/30/23  6. PSFS to have improved to 10 Goal status: INITIAL      PLAN:  PT FREQUENCY: 2x/week  PT DURATION: 6 weeks  PLANNED  INTERVENTIONS: 97110-Therapeutic exercises, 97530- Therapeutic activity, 97112- Neuromuscular re-education, 97535- Self Care, 16109- Manual therapy, and 97033- Ionotophoresis 4mg /ml Dexamethasone  PLAN FOR NEXT SESSION: strengthen posterior chain, work on flexibility and biomechanics, manual/DN as desired   Ollen Beverage, PTA 10/30/2023, 10:52 AM

## 2023-11-02 ENCOUNTER — Encounter: Payer: Self-pay | Admitting: Physical Therapy

## 2023-11-02 ENCOUNTER — Ambulatory Visit: Admitting: Physical Therapy

## 2023-11-02 DIAGNOSIS — M79605 Pain in left leg: Secondary | ICD-10-CM | POA: Diagnosis not present

## 2023-11-02 DIAGNOSIS — M6281 Muscle weakness (generalized): Secondary | ICD-10-CM

## 2023-11-02 DIAGNOSIS — R2681 Unsteadiness on feet: Secondary | ICD-10-CM

## 2023-11-02 NOTE — Therapy (Signed)
 OUTPATIENT PHYSICAL THERAPY LOWER EXTREMITY TREATMENT    Patient Name: Tracy Blankenship MRN: [id number] DOB:May 26, 1956, 68 y.o., female Today's Date: 11/02/2023  END OF SESSION:  PT End of Session - 11/02/23 1022     Visit Number 6    Number of Visits 13    Authorization Type [employer]    Authorization Time Period 10/07/23 to 11/18/23    Progress Note Due on Visit 10    PT Start Time 1021    PT Stop Time 1059    PT Time Calculation (min) 38 min    Activity Tolerance Patient tolerated treatment well    Behavior During Therapy WFL for tasks assessed/performed               Past Medical History:  Diagnosis Date   Allergy    Chronic sinusitis    Constipation    Hyperglycemia    IC (interstitial cystitis)    Plantar fasciitis    Pneumonia    Stress reaction    Trigger finger    Past Surgical History:  Procedure Laterality Date   APPENDECTOMY     CESAREAN SECTION     X 2   WISDOM TOOTH EXTRACTION     Patient Active Problem List   Diagnosis Date Noted   Urinary frequency 10/21/2023   Sore throat 10/16/2023   Strep throat 09/01/2023   Family history of heart disease 05/11/2023   Hearing loss 11/11/2022   Abnormal chest x-ray 08/05/2022   Elevated blood pressure reading 07/28/2022   History of colon polyps 07/22/2022   Estrogen deficiency 07/25/2020   Eczema 02/09/2017   Mild hyperlipidemia 02/08/2016   H/O herpes zoster 01/31/2014   Vaginal dryness 10/29/2012   Routine general medical examination at a health care facility 03/30/2011   Urinary incontinence 01/07/2011   Allergic rhinitis 12/21/2009   INTERSTITIAL CYSTITIS 12/08/2006   Prediabetes 12/08/2006    PCP: Deri Fleet MD   REFERRING PROVIDER: Syliva Even, MD  REFERRING DIAG: Diagnosis [phone] (ICD-10-CM) - Chronic pain of left knee M25.552 (ICD-10-CM) - Left hip pain  THERAPY DIAG:  Muscle weakness (generalized)  Unsteadiness on feet  Pain in left leg  Rationale for Evaluation and  Treatment: Rehabilitation  ONSET DATE: 3 months ago   SUBJECTIVE:   SUBJECTIVE STATEMENT:   Everything's fine, pain is better. Out of the things he had me doing the other day, I couldn't balance on one foot but in terms of real life things are better. As far as getting around I'd say I'm at 90%, in terms of strength I'd say 80%.     EVAL: Pain started in my hip a couple of months, xray was ok and ended up doing some hip PT but after that I started having some terrible pain in my left knee. Everything functional like steps and getting out of car was very painful, ended up going back to MD office and saw Dr. Alease Hunter instead of Dr. Cleora Daft who did US  and gave cortisone shot. Shot has helped tremendously but I know when it wears off it will give me problems again. I've also been having issues with spasms and sharp pains in the front of my left hip. 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 Work with plants too, similar activities. Got exercises from [facility], they gave me quad  stretch, HS stretches, SKTC, figure four, hip abduction, clam shells (from [facility] per pt handout, not from formal PT). Have always had issues with getting up easily from the floor even before pain started.   PERTINENT HISTORY:  See above  PAIN:  Are you having pain? No 0/10  PRECAUTIONS: None  RED FLAGS: None   WEIGHT BEARING RESTRICTIONS: No  FALLS:  Has patient fallen in last 6 months? No  LIVING ENVIRONMENT: Lives with: lives with their family Lives in: House/apartment   OCCUPATION: retired but works with [employer], used to teach high school   PLOF: Independent, Independent with basic ADLs, Independent with gait, and Independent with transfers  PATIENT GOALS: be pain free with all functional activities, be able to get up off the floor, improve balance    NEXT MD VISIT: PRN with Dr. Alease Hunter   OBJECTIVE:  Note: Objective measures were completed at Evaluation unless otherwise noted.  DIAGNOSTIC FINDINGS:     Lower Venous DVT Study   Patient Name:  Tracy Blankenship  Date of Exam:   09/21/2023  Medical Rec #: [id number]       Accession #:    [id number]  Date of Birth: 09/18/1955      Patient Gender: F  Patient Age:   77 years  Exam Location:  [facility]  Procedure:      VAS US  LOWER EXTREMITY VENOUS (DVT)  Referring Phys: Garlan Juniper    ---------------------------------------------------------------------------  -----    Performing Technologist: Parke Boll RVS, RCS     Examination Guidelines:  A complete evaluation includes B-mode imaging, spectral Doppler, color  Doppler,  and power Doppler as needed of all accessible portions of each vessel.  Bilateral  testing is considered an integral part of a complete examination. Limited  examinations for reoccurring indications may be performed as noted. The  reflux  portion of the exam is performed with the patient in reverse  Trendelenburg.        +---------+---------------+---------+-----------+----------+--------------+   LEFT    CompressibilityPhasicitySpontaneityPropertiesThrombus  Aging  +---------+---------------+---------+-----------+----------+--------------+   CFV     Full                                                          +---------+---------------+---------+-----------+----------+--------------+   SFJ     Full                                                          +---------+---------------+---------+-----------+----------+--------------+   FV Prox  Full                                                          +---------+---------------+---------+-----------+----------+--------------+   FV Mid   Full                                                           +---------+---------------+---------+-----------+----------+--------------+  FV DistalFull                                                          +---------+---------------+---------+-----------+----------+--------------+   POP     Full                                                          +---------+---------------+---------+-----------+----------+--------------+   PTV     Full                                                          +---------+---------------+---------+-----------+----------+--------------+   PERO    Full                                                          +---------+---------------+---------+-----------+----------+--------------+   GSV     Full                                                          +---------+---------------+---------+-----------+----------+--------------+             Findings reported to routed in epic at 11:38 am.    Summary:  RIGHT:    - There is no evidence of deep vein thrombosis in the lower extremity.  - There is no evidence of superficial venous thrombosis.    LEFT:  - No evidence of common femoral vein obstruction.    CLINICAL DATA:  Chronic hip pain.   EXAM: DG HIP (WITH OR WITHOUT PELVIS) 3V LEFT   COMPARISON:  None Available.   FINDINGS: There is no evidence of hip fracture or dislocation. Pelvic ring is intact. There is no evidence of arthropathy or other focal bone abnormality.   IMPRESSION: Negative.  CLINICAL DATA:  Left knee pain for 3-4 weeks.  No known inj   EXAM: LEFT KNEE 3 VIEWS   COMPARISON:  None Available.   FINDINGS: No evidence of fracture, dislocation, or joint effusion. The alignment and joint spaces are normal. Minor peripheral spurring in the patellofemoral compartment. No erosions or focal bone abnormality. Soft tissues are unremarkable.   IMPRESSION: Minor degenerative peripheral spurring in the  patellofemoral compartment.  PATIENT SURVEYS:    Patient-Specific Activity Scoring Scheme  "0" represents "unable to perform." "10" represents "able to perform at prior level. 0 1 2 3 4 5 6 7 8 9  10 (Date and Score)   Activity Eval   11/02/23  1. Getting up off the floor   8 "generous"   9  2. Steps   8 "fearful"   10  3. Getting in/out of garden tub  7 9  4.    5.    Score 7.6 9.3   Total score = sum of the activity scores/number of activities Minimum detectable change (90%CI) for average score = 2 points Minimum detectable change (90%CI) for single activity score = 3 points     COGNITION: Overall cognitive status: Within functional limits for tasks assessed     SENSATION: Not tested    MUSCLE LENGTH:  HS severe limitation B, piriformis mod limitation L/mild R    PALPATION:  HS and proximal gastroc tender L LE     LOWER EXTREMITY MMT:  MMT Right eval Left eval  Hip flexion 4+ 4+  Hip extension 3+ 3+  Hip abduction 4+ 4+  Hip adduction    Hip internal rotation    Hip external rotation    Knee flexion 4 4  Knee extension 4+ 4+  Ankle dorsiflexion 4+ 4+  Ankle plantarflexion    Ankle inversion    Ankle eversion     (Blank rows = not tested)    FUNCTIONAL TESTS:  5 times sit to stand: 14 seconds no UEs  SLS 7 seconds R, 10 seconds L; 11/02/23  R 13 seconds, L15 seconds                                                                                                                                   TREATMENT DATE:   11/02/23  SLS times, PSFS update  Nustep L5x8 minutes seat 8, all four extremities   Tandem stance solid surface 3x30 seconds B Tandem walking next to TM x4 laps  Sidesteps blue foam pad x4 laps  Tandem walks blue foam pad x4 laps  One foot on BOSU/other on ground 3x30 seconds B     10/30/23 NuStep L5x 6 mins  GOALS  LLE 7 sec, RLE 11 sec S2S OHP yellow ball 2x10  Leg ext 5# 2x10 HS curls 20# 2x10 Shoulder Ext 6lb  2x10 Lateral step ups x 10 each   PATIENT EDUCATION:  Education details: exam findings, POC, HEP  Person educated: Patient Education method: Explanation, Demonstration, and Handouts Education comprehension: verbalized understanding, returned demonstration, and needs further education  HOME EXERCISE PROGRAM:  Access Code: [facility] URL: [email or web address] Date: 11/02/2023 Prepared by: Terrel Ferries  Exercises - Supine Bridge with Resistance Band  - 1 x daily - 5 x weekly - 2 sets - 10 reps - 2 seconds  hold - Standing Hamstring Curl with Resistance  - 1 x daily - 5 x weekly - 2 sets - 10 reps - 2 seconds   hold - Hooklying Hamstring Stretch with Strap  - 1 x daily - 7 x weekly - 1 sets - 3 reps - 30 hold - Tandem Stance in Corner  - 1 x daily - 7 x weekly - 1 sets - 6 reps - 30 seconds  hold - Tandem Walking with Counter Support  - 1 x daily -  7 x weekly - 1 sets - 4 reps   ASSESSMENT:  CLINICAL IMPRESSION:               Arrives today doing well, feels she is at about 80-90% of where she'd like to be, strength and balance are her biggest concerns right now but she may overall be close to DC. Continued to progress challenges today, she has 3 visits left in current cert period, may be ready for DC by that time.    EVAL: Patient is a 68 y.o. F who was seen today for physical therapy evaluation and treatment for Diagnosis M25.562,G89.29 (ICD-10-CM) - Chronic pain of left knee M25.552 (ICD-10-CM) - Left hip pain. She does have a strong anterior chain bias and fairly limited strength in posterior chain musculature, also flexibility impairments and mild balance deficit. Anticipate she will benefit from skilled PT services to address all concerns and optimize pain free function.   OBJECTIVE IMPAIRMENTS: decreased activity tolerance, decreased balance, decreased mobility, difficulty walking, decreased ROM, decreased strength, impaired flexibility, improper body mechanics, and  pain.   ACTIVITY LIMITATIONS: carrying, lifting, bending, standing, stairs, transfers, and locomotion level  PARTICIPATION LIMITATIONS: meal prep, cleaning, laundry, driving, shopping, community activity, occupation, and yard work  PERSONAL FACTORS: Age, Fitness, Past/current experiences, Profession, and Time since onset of injury/illness/exacerbation are also affecting patient's functional outcome.   REHAB POTENTIAL: Good  CLINICAL DECISION MAKING: Stable/uncomplicated  EVALUATION COMPLEXITY: Low   GOALS: Goals reviewed with patient? No  SHORT TERM GOALS: Target date: 10/28/2023   Will be compliant with appropriate progressive HEP  Baseline: Goal status: MET 10/14/23  2.  Will complete 5xSTS in 10 seconds or less no offshifting away from pain LE  Baseline:  Goal status: Met 7.74 sec  3.  Hamstring, piriformis, and hip flexor flexibility impairments to have resolved  Baseline:  Goal status: Met 10/30/23    LONG TERM GOALS: Target date: 11/18/2023    MMT to have improved by one grade in all weak groups  Baseline:  Goal status: INITIAL  2.  Will be able to climb stairs reciprocally without increase in pain  Baseline:  Goal status: MET 10/14/23  3.  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  Will be able to maintain SLS for 20 seconds B to show improved functional balance  Baseline:  Goal status: Ongoing 11/02/23  5.  Will be able to get down to/up from the floor without increase in pain  Baseline:  Goal status: Progressing 10/30/23  6. PSFS to have improved to 10 Goal status: ONGOING 11/02/23     PLAN:  PT FREQUENCY: 2x/week  PT DURATION: 6 weeks  PLANNED INTERVENTIONS: 97110-Therapeutic exercises, 97530- Therapeutic activity, 97112- Neuromuscular re-education, 97535- Self Care, 16109- Manual therapy, and 97033- Ionotophoresis  4mg /ml Dexamethasone  PLAN FOR NEXT SESSION: strengthen posterior chain, work on flexibility and biomechanics, manual/DN as desired, possible DC to advanced HEP at end of scheduled visits   Terrel Ferries, PT, DPT 11/02/23 10:59 AM

## 2023-11-05 ENCOUNTER — Ambulatory Visit: Admitting: Physical Therapy

## 2023-11-05 ENCOUNTER — Encounter: Payer: Self-pay | Admitting: Physical Therapy

## 2023-11-05 DIAGNOSIS — M6281 Muscle weakness (generalized): Secondary | ICD-10-CM | POA: Diagnosis not present

## 2023-11-05 DIAGNOSIS — M79605 Pain in left leg: Secondary | ICD-10-CM

## 2023-11-05 DIAGNOSIS — R2681 Unsteadiness on feet: Secondary | ICD-10-CM | POA: Diagnosis not present

## 2023-11-05 NOTE — Therapy (Signed)
 OUTPATIENT PHYSICAL THERAPY LOWER EXTREMITY TREATMENT    Patient Name: Tracy Blankenship MRN: [id number] DOB:08-Jun-1955, 68 y.o., female Today's Date: 11/05/2023  END OF SESSION:  PT End of Session - 11/05/23 1033     Visit Number 7    Number of Visits 13    Authorization Type [employer]    Authorization Time Period 10/07/23 to 11/18/23    Progress Note Due on Visit 10    PT Start Time 1016    PT Stop Time 1055    PT Time Calculation (min) 39 min    Activity Tolerance Patient tolerated treatment well    Behavior During Therapy WFL for tasks assessed/performed             Past Medical History:  Diagnosis Date   Allergy    Chronic sinusitis    Constipation    Hyperglycemia    IC (interstitial cystitis)    Plantar fasciitis    Pneumonia    Stress reaction    Trigger finger    Past Surgical History:  Procedure Laterality Date   APPENDECTOMY     CESAREAN SECTION     X 2   WISDOM TOOTH EXTRACTION     Patient Active Problem List   Diagnosis Date Noted   Urinary frequency 10/21/2023   Sore throat 10/16/2023   Strep throat 09/01/2023   Family history of heart disease 05/11/2023   Hearing loss 11/11/2022   Abnormal chest x-ray 08/05/2022   Elevated blood pressure reading 07/28/2022   History of colon polyps 07/22/2022   Estrogen deficiency 07/25/2020   Eczema 02/09/2017   Mild hyperlipidemia 02/08/2016   H/O herpes zoster 01/31/2014   Vaginal dryness 10/29/2012   Routine general medical examination at a health care facility 03/30/2011   Urinary incontinence 01/07/2011   Allergic rhinitis 12/21/2009   INTERSTITIAL CYSTITIS 12/08/2006   Prediabetes 12/08/2006    PCP: Deri Fleet MD   REFERRING PROVIDER: Syliva Even, MD  REFERRING DIAG: Diagnosis [phone] (ICD-10-CM) - Chronic pain of left knee M25.552 (ICD-10-CM) - Left hip pain  THERAPY DIAG:  Muscle weakness (generalized)  Unsteadiness on feet  Pain in left leg  Rationale for Evaluation and  Treatment: Rehabilitation  ONSET DATE: 3 months ago   SUBJECTIVE:   SUBJECTIVE STATEMENT:   Nothing new since last time,  knee is feeling good     EVAL: Pain started in my hip a couple of months, xray was ok and ended up doing some hip PT but after that I started having some terrible pain in my left knee. Everything functional like steps and getting out of car was very painful, ended up going back to MD office and saw Dr. Alease Hunter instead of Dr. Cleora Daft who did US  and gave cortisone shot. Shot has helped tremendously but I know when it wears off it will give me problems again. I've also been having issues with spasms and sharp pains in the front of my left hip. 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 Work with plants too, similar activities. Got exercises from [facility], they gave me quad stretch, HS stretches, SKTC, figure four, hip abduction, clam shells (from [facility] per pt handout, not from formal PT). Have always had issues with getting up easily from the floor even before pain started.   PERTINENT HISTORY:  See  above  PAIN:  Are you having pain? No 0/10 now   PRECAUTIONS: None  RED FLAGS: None   WEIGHT BEARING RESTRICTIONS: No  FALLS:  Has patient fallen in last 6 months? No  LIVING ENVIRONMENT: Lives with: lives with their family Lives in: House/apartment   OCCUPATION: retired but works with [employer], used to teach high school   PLOF: Independent, Independent with basic ADLs, Independent with gait, and Independent with transfers  PATIENT GOALS: be pain free with all functional activities, be able to get up off the floor, improve balance   NEXT MD VISIT: PRN with Dr. Alease Hunter   OBJECTIVE:  Note: Objective measures were completed at Evaluation unless otherwise noted.  DIAGNOSTIC FINDINGS:     Lower Venous DVT Study    Patient Name:  Tracy Blankenship  Date of Exam:   09/21/2023  Medical Rec #: [id number]       Accession #:    [id number]  Date of Birth: [birth year]      Patient Gender: F  Patient Age:   2 years  Exam Location:  [facility]  Procedure:      VAS US  LOWER EXTREMITY VENOUS (DVT)  Referring Phys: Garlan Juniper    ---------------------------------------------------------------------------  -----    Performing Technologist: Parke Boll RVS, RCS     Examination Guidelines:  A complete evaluation includes B-mode imaging, spectral Doppler, color  Doppler,  and power Doppler as needed of all accessible portions of each vessel.  Bilateral  testing is considered an integral part of a complete examination. Limited  examinations for reoccurring indications may be performed as noted. The  reflux  portion of the exam is performed with the patient in reverse  Trendelenburg.        +---------+---------------+---------+-----------+----------+--------------+   LEFT    CompressibilityPhasicitySpontaneityPropertiesThrombus  Aging  +---------+---------------+---------+-----------+----------+--------------+   CFV     Full                                                          +---------+---------------+---------+-----------+----------+--------------+   SFJ     Full                                                          +---------+---------------+---------+-----------+----------+--------------+   FV Prox  Full                                                          +---------+---------------+---------+-----------+----------+--------------+   FV Mid   Full                                                          +---------+---------------+---------+-----------+----------+--------------+   FV DistalFull                                                           +---------+---------------+---------+-----------+----------+--------------+  POP     Full                                                          +---------+---------------+---------+-----------+----------+--------------+   PTV     Full                                                          +---------+---------------+---------+-----------+----------+--------------+   PERO    Full                                                          +---------+---------------+---------+-----------+----------+--------------+   GSV     Full                                                          +---------+---------------+---------+-----------+----------+--------------+             Findings reported to routed in epic at 11:38 am.    Summary:  RIGHT:    - There is no evidence of deep vein thrombosis in the lower extremity.  - There is no evidence of superficial venous thrombosis.    LEFT:  - No evidence of common femoral vein obstruction.    CLINICAL DATA:  Chronic hip pain.   EXAM: DG HIP (WITH OR WITHOUT PELVIS) 3V LEFT   COMPARISON:  None Available.   FINDINGS: There is no evidence of hip fracture or dislocation. Pelvic ring is intact. There is no evidence of arthropathy or other focal bone abnormality.   IMPRESSION: Negative.  CLINICAL DATA:  Left knee pain for 3-4 weeks.  No known inj   EXAM: LEFT KNEE 3 VIEWS   COMPARISON:  None Available.   FINDINGS: No evidence of fracture, dislocation, or joint effusion. The alignment and joint spaces are normal. Minor peripheral spurring in the patellofemoral compartment. No erosions or focal bone abnormality. Soft tissues are unremarkable.   IMPRESSION: Minor degenerative peripheral spurring in the patellofemoral compartment.  PATIENT SURVEYS:    Patient-Specific Activity Scoring Scheme  0 represents "unable to perform." 10 represents "able to perform at prior level. 0 1 2 3 4  5 6 7 8 9 10  (Date and Score)   Activity Eval   11/02/23  1. Getting up off the floor   8 generous   9  2. Steps   8 fearful   10  3. Getting in/out of garden tub  7 9  4.    5.    Score 7.6 9.3   Total score = sum of the activity scores/number of activities Minimum detectable change (90%CI) for average score = 2 points Minimum detectable change (90%CI) for single activity score = 3 points     COGNITION: Overall cognitive status: Within functional limits for tasks assessed  SENSATION: Not tested    MUSCLE LENGTH:  HS severe limitation B, piriformis mod limitation L/mild R    PALPATION:  HS and proximal gastroc tender L LE     LOWER EXTREMITY MMT:  MMT Right eval Left eval  Hip flexion 4+ 4+  Hip extension 3+ 3+  Hip abduction 4+ 4+  Hip adduction    Hip internal rotation    Hip external rotation    Knee flexion 4 4  Knee extension 4+ 4+  Ankle dorsiflexion 4+ 4+  Ankle plantarflexion    Ankle inversion    Ankle eversion     (Blank rows = not tested)    FUNCTIONAL TESTS:  5 times sit to stand: 14 seconds no UEs  SLS 7 seconds R, 10 seconds L; 11/02/23  R 13 seconds, L15 seconds                                                                                                                                   TREATMENT DATE:   11/05/23  Nustep seat 8 all four extremities, L6x4 minutes then dropped to L5 for an additional 4 minutes (8 minutes total)  Standing on blue foam pad with taps to top of BOSU x20  Tandem stance blue foam pad 3x30 seconds B 3 way taps blue foam pad 2x5 B  Forward and backward heel toe walks x3 laps solid surface   For mm strength: - 3 way hip green TB x10 B - seated LAQs green TB x12 B - 5# goblet hold + eccentric lower for quads x10  - TM belt pushes x10 B  11/02/23  SLS times, PSFS update  Nustep L5x8 minutes seat 8, all four extremities   Tandem stance solid surface 3x30 seconds B Tandem walking next to  TM x4 laps  Sidesteps blue foam pad x4 laps  Tandem walks blue foam pad x4 laps  One foot on BOSU/other on ground 3x30 seconds B     10/30/23 NuStep L5x 6 mins  GOALS  LLE 7 sec, RLE 11 sec S2S OHP yellow ball 2x10  Leg ext 5# 2x10 HS curls 20# 2x10 Shoulder Ext 6lb 2x10 Lateral step ups x 10 each   PATIENT EDUCATION:  Education details: exam findings, POC, HEP  Person educated: Patient Education method: Explanation, Demonstration, and Handouts Education comprehension: verbalized understanding, returned demonstration, and needs further education  HOME EXERCISE PROGRAM:  Access Code: [facility] URL: [email or web address] Date: 11/05/2023 Prepared by: Terrel Ferries  Exercises - Supine Bridge with Resistance Band  - 1 x daily - 5 x weekly - 2 sets - 10 reps - 2 seconds  hold - Standing Hamstring Curl with Resistance  - 1 x daily - 5 x weekly - 2 sets - 10 reps - 2 seconds   hold - Hooklying Hamstring Stretch with Strap  - 1 x daily - 7 x weekly - 1 sets - 3 reps -  30 hold - Tandem Stance in Corner  - 1 x daily - 7 x weekly - 1 sets - 6 reps - 30 seconds  hold - Tandem Walking with Counter Support  - 1 x daily - 7 x weekly - 1 sets - 4 reps - Standing 3-way Hip with Walker  - 1 x daily - 5 x weekly - 1-2 sets - 10 reps - Seated Knee Extension with Anchored Resistance  - 1 x daily - 5 x weekly - 1-2 sets - 10 reps - 2 seconds  hold    ASSESSMENT:  CLINICAL IMPRESSION:               Continued working on strength and balance work today, she is a bit concerned about her balance still. Progressing very well, still anticipate that she will be ready for DC soon.    EVAL: Patient is a 68 y.o. F who was seen today for physical therapy evaluation and treatment for Diagnosis M25.562,G89.29 (ICD-10-CM) - Chronic pain of left knee M25.552 (ICD-10-CM) - Left hip pain. She does have a strong anterior chain bias and fairly limited strength in posterior chain musculature, also  flexibility impairments and mild balance deficit. Anticipate she will benefit from skilled PT services to address all concerns and optimize pain free function.   OBJECTIVE IMPAIRMENTS: decreased activity tolerance, decreased balance, decreased mobility, difficulty walking, decreased ROM, decreased strength, impaired flexibility, improper body mechanics, and pain.   ACTIVITY LIMITATIONS: carrying, lifting, bending, standing, stairs, transfers, and locomotion level  PARTICIPATION LIMITATIONS: meal prep, cleaning, laundry, driving, shopping, community activity, occupation, and yard work  PERSONAL FACTORS: Age, Fitness, Past/current experiences, Profession, and Time since onset of injury/illness/exacerbation are also affecting patient's functional outcome.   REHAB POTENTIAL: Good  CLINICAL DECISION MAKING: Stable/uncomplicated  EVALUATION COMPLEXITY: Low   GOALS: Goals reviewed with patient? No  SHORT TERM GOALS: Target date: 10/28/2023   Will be compliant with appropriate progressive HEP  Baseline: Goal status: MET 10/14/23  2.  Will complete 5xSTS in 10 seconds or less no offshifting away from pain LE  Baseline:  Goal status: Met 7.74 sec  3.  Hamstring, piriformis, and hip flexor flexibility impairments to have resolved  Baseline:  Goal status: Met 10/30/23    LONG TERM GOALS: Target date: 11/18/2023    MMT to have improved by one grade in all weak groups  Baseline:  Goal status: INITIAL  2.  Will be able to climb stairs reciprocally without increase in pain  Baseline:  Goal status: MET 10/14/23  3.  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  Will be able to maintain SLS for 20 seconds B to show improved functional balance  Baseline:  Goal status: Ongoing 11/02/23  5.  Will be able to get down to/up from the floor without increase in pain   Baseline:  Goal status: Progressing 10/30/23  6. PSFS to have improved to 10 Goal status: ONGOING 11/02/23     PLAN:  PT FREQUENCY: 2x/week  PT DURATION: 6 weeks  PLANNED INTERVENTIONS: 97110-Therapeutic exercises, 97530- Therapeutic activity, 97112- Neuromuscular re-education, 97535- Self Care, 72536- Manual therapy, and 97033- Ionotophoresis 4mg /ml Dexamethasone  PLAN FOR NEXT SESSION: strengthen posterior chain, work on flexibility and biomechanics, manual/DN as desired, likely DC in 2 visits- update HEP (balance this time) and MMT next visit  Terrel Ferries, PT, DPT 11/05/23 10:56 AM

## 2023-11-06 ENCOUNTER — Other Ambulatory Visit (INDEPENDENT_AMBULATORY_CARE_PROVIDER_SITE_OTHER)

## 2023-11-06 DIAGNOSIS — E785 Hyperlipidemia, unspecified: Secondary | ICD-10-CM

## 2023-11-06 NOTE — Addendum Note (Signed)
 Addended by: Gerry Krone on: 11/06/2023 09:01 AM   Modules accepted: Orders

## 2023-11-07 ENCOUNTER — Ambulatory Visit: Payer: Self-pay | Admitting: Family Medicine

## 2023-11-07 LAB — LIPID PANEL
Cholesterol: 224 mg/dL — ABNORMAL HIGH (ref <200)
HDL: 64 mg/dL (ref >=50)
LDL Cholesterol (Calc): 137 mg/dL — ABNORMAL HIGH
Non-HDL Cholesterol (Calc): 160 mg/dL — ABNORMAL HIGH (ref <130)
Total CHOL/HDL Ratio: 3.5 (calc) (ref <5.0)
Triglycerides: 117 mg/dL (ref <150)

## 2023-11-09 ENCOUNTER — Ambulatory Visit: Admitting: Physical Therapy

## 2023-11-09 ENCOUNTER — Encounter: Payer: Self-pay | Admitting: Physical Therapy

## 2023-11-09 DIAGNOSIS — R2681 Unsteadiness on feet: Secondary | ICD-10-CM | POA: Diagnosis not present

## 2023-11-09 DIAGNOSIS — M79605 Pain in left leg: Secondary | ICD-10-CM | POA: Diagnosis not present

## 2023-11-09 DIAGNOSIS — M6281 Muscle weakness (generalized): Secondary | ICD-10-CM | POA: Diagnosis not present

## 2023-11-09 NOTE — Therapy (Signed)
 OUTPATIENT PHYSICAL THERAPY LOWER EXTREMITY TREATMENT    Patient Name: Tracy Blankenship MRN: [id number] DOB:07/01/1955, 68 y.o., female Today's Date: 11/09/2023  END OF SESSION:  PT End of Session - 11/09/23 1033     Visit Number 8    Number of Visits 13    Authorization Type [employer]    Authorization Time Period 10/07/23 to 11/18/23    Progress Note Due on Visit 10    PT Start Time 1017    PT Stop Time 1055    PT Time Calculation (min) 38 min    Activity Tolerance Patient tolerated treatment well    Behavior During Therapy WFL for tasks assessed/performed              Past Medical History:  Diagnosis Date   Allergy    Chronic sinusitis    Constipation    Hyperglycemia    IC (interstitial cystitis)    Plantar fasciitis    Pneumonia    Stress reaction    Trigger finger    Past Surgical History:  Procedure Laterality Date   APPENDECTOMY     CESAREAN SECTION     X 2   WISDOM TOOTH EXTRACTION     Patient Active Problem List   Diagnosis Date Noted   Urinary frequency 10/21/2023   Sore throat 10/16/2023   Strep throat 09/01/2023   Family history of heart disease 05/11/2023   Hearing loss 11/11/2022   Abnormal chest x-ray 08/05/2022   Elevated blood pressure reading 07/28/2022   History of colon polyps 07/22/2022   Estrogen deficiency 07/25/2020   Eczema 02/09/2017   Mild hyperlipidemia 02/08/2016   H/O herpes zoster 01/31/2014   Vaginal dryness 10/29/2012   Routine general medical examination at a health care facility 03/30/2011   Urinary incontinence 01/07/2011   Allergic rhinitis 12/21/2009   INTERSTITIAL CYSTITIS 12/08/2006   Prediabetes 12/08/2006    PCP: Deri Fleet MD   REFERRING PROVIDER: Syliva Even, MD  REFERRING DIAG: Diagnosis [phone] (ICD-10-CM) - Chronic pain of left knee M25.552 (ICD-10-CM) - Left hip pain  THERAPY DIAG:  Muscle weakness (generalized)  Unsteadiness on feet  Pain in left leg  Rationale for Evaluation and  Treatment: Rehabilitation  ONSET DATE: 3 months ago   SUBJECTIVE:   SUBJECTIVE STATEMENT:   TM pushes from last time were a bit hard, felt sore from them for a few days. OK now. Let's work on balance more today    EVAL: Pain started in my hip a couple of months, xray was ok and ended up doing some hip PT but after that I started having some terrible pain in my left knee. Everything functional like steps and getting out of car was very painful, ended up going back to MD office and saw Dr. Alease Hunter instead of Dr. Cleora Daft who did US  and gave cortisone shot. Shot has helped tremendously but I know when it wears off it will give me problems again. I've also been having issues with spasms and sharp pains in the front of my left hip. 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 Work with plants too, similar activities. Got exercises from [facility], they gave me quad stretch, HS stretches, SKTC, figure four, hip abduction, clam shells (from [facility] per pt handout, not from formal PT). Have always had issues with getting  up easily from the floor even before pain started.   PERTINENT HISTORY:  See above  PAIN:  Are you having pain? No 0/10 today   PRECAUTIONS: None  RED FLAGS: None   WEIGHT BEARING RESTRICTIONS: No  FALLS:  Has patient fallen in last 6 months? No  LIVING ENVIRONMENT: Lives with: lives with their family Lives in: House/apartment   OCCUPATION: retired but works with [employer], used to teach high school   PLOF: Independent, Independent with basic ADLs, Independent with gait, and Independent with transfers  PATIENT GOALS: be pain free with all functional activities, be able to get up off the floor, improve balance   NEXT MD VISIT: PRN with Dr. Alease Hunter   OBJECTIVE:  Note: Objective measures were completed at Evaluation unless  otherwise noted.  DIAGNOSTIC FINDINGS:     Lower Venous DVT Study   Patient Name:  Tracy Blankenship  Date of Exam:   09/21/2023  Medical Rec #: [id number]       Accession #:    [id number]  Date of Birth: Jul 23, 1955      Patient Gender: F  Patient Age:   89 years  Exam Location:  [facility]  Procedure:      VAS US  LOWER EXTREMITY VENOUS (DVT)  Referring Phys: Garlan Juniper    ---------------------------------------------------------------------------  -----    Performing Technologist: Parke Boll RVS, RCS     Examination Guidelines:  A complete evaluation includes B-mode imaging, spectral Doppler, color  Doppler,  and power Doppler as needed of all accessible portions of each vessel.  Bilateral  testing is considered an integral part of a complete examination. Limited  examinations for reoccurring indications may be performed as noted. The  reflux  portion of the exam is performed with the patient in reverse  Trendelenburg.        +---------+---------------+---------+-----------+----------+--------------+   LEFT    CompressibilityPhasicitySpontaneityPropertiesThrombus  Aging  +---------+---------------+---------+-----------+----------+--------------+   CFV     Full                                                          +---------+---------------+---------+-----------+----------+--------------+   SFJ     Full                                                          +---------+---------------+---------+-----------+----------+--------------+   FV Prox  Full                                                          +---------+---------------+---------+-----------+----------+--------------+   FV Mid   Full                                                          +---------+---------------+---------+-----------+----------+--------------+   FV DistalFull                                                           +---------+---------------+---------+-----------+----------+--------------+  POP     Full                                                          +---------+---------------+---------+-----------+----------+--------------+   PTV     Full                                                          +---------+---------------+---------+-----------+----------+--------------+   PERO    Full                                                          +---------+---------------+---------+-----------+----------+--------------+   GSV     Full                                                          +---------+---------------+---------+-----------+----------+--------------+             Findings reported to routed in epic at 11:38 am.    Summary:  RIGHT:    - There is no evidence of deep vein thrombosis in the lower extremity.  - There is no evidence of superficial venous thrombosis.    LEFT:  - No evidence of common femoral vein obstruction.    CLINICAL DATA:  Chronic hip pain.   EXAM: DG HIP (WITH OR WITHOUT PELVIS) 3V LEFT   COMPARISON:  None Available.   FINDINGS: There is no evidence of hip fracture or dislocation. Pelvic ring is intact. There is no evidence of arthropathy or other focal bone abnormality.   IMPRESSION: Negative.  CLINICAL DATA:  Left knee pain for 3-4 weeks.  No known inj   EXAM: LEFT KNEE 3 VIEWS   COMPARISON:  None Available.   FINDINGS: No evidence of fracture, dislocation, or joint effusion. The alignment and joint spaces are normal. Minor peripheral spurring in the patellofemoral compartment. No erosions or focal bone abnormality. Soft tissues are unremarkable.   IMPRESSION: Minor degenerative peripheral spurring in the patellofemoral compartment.  PATIENT SURVEYS:    Patient-Specific Activity Scoring Scheme  0 represents "unable to perform." 10 represents "able to perform at prior level. 0 1 2  3 4 5 6 7 8 9 10  (Date and Score)   Activity Eval   11/02/23  1. Getting up off the floor   8 generous   9  2. Steps   8 fearful   10  3. Getting in/out of garden tub  7 9  4.    5.    Score 7.6 9.3   Total score = sum of the activity scores/number of activities Minimum detectable change (90%CI) for average score = 2 points Minimum detectable change (90%CI) for single activity score = 3 points     COGNITION: Overall cognitive status: Within functional limits for tasks assessed  SENSATION: Not tested    MUSCLE LENGTH:  HS severe limitation B, piriformis mod limitation L/mild R    PALPATION:  HS and proximal gastroc tender L LE     LOWER EXTREMITY MMT:  MMT Right eval Left eval Right 11/09/23 Left 11/09/23  Hip flexion 4+ 4+ 4+ 4+  Hip extension 3+ 3+ 4+ 4+  Hip abduction 4+ 4+ 5 5  Hip adduction      Hip internal rotation      Hip external rotation      Knee flexion 4 4 5 5   Knee extension 4+ 4+ 5 5  Ankle dorsiflexion 4+ 4+    Ankle plantarflexion      Ankle inversion      Ankle eversion       (Blank rows = not tested)    FUNCTIONAL TESTS:  5 times sit to stand: 14 seconds no UEs  SLS 7 seconds R, 10 seconds L; 11/02/23  R 13 seconds, L15 seconds                                                                                                                                   TREATMENT DATE:   11/09/23  Scifit bike L5x8 minutes seat 8 (Nustep not available)   MMT and education on progress with PT/towards goals, discussed footwear with good grip/proper shoe size to help reduce tripping/fall risk   One foot on BOSU/other on floor 3x30 seconds B  Alternating marches with opposite UE/LE flexion x10 B  Tandem stance with head turns x4 rounds each side Tandem stance EC 4x30 seconds Tandem stance head nods x4 rounds each side         11/05/23  Nustep seat 8 all four extremities, L6x4 minutes then dropped to L5 for an additional 4  minutes (8 minutes total)  Standing on blue foam pad with taps to top of BOSU x20  Tandem stance blue foam pad 3x30 seconds B 3 way taps blue foam pad 2x5 B  Forward and backward heel toe walks x3 laps solid surface   For mm strength: - 3 way hip green TB x10 B - seated LAQs green TB x12 B - 5# goblet hold + eccentric lower for quads x10  - TM belt pushes x10 B  11/02/23  SLS times, PSFS update  Nustep L5x8 minutes seat 8, all four extremities   Tandem stance solid surface 3x30 seconds B Tandem walking next to TM x4 laps  Sidesteps blue foam pad x4 laps  Tandem walks blue foam pad x4 laps  One foot on BOSU/other on ground 3x30 seconds B     10/30/23 NuStep L5x 6 mins  GOALS  LLE 7 sec, RLE 11 sec S2S OHP yellow ball 2x10  Leg ext 5# 2x10 HS curls 20# 2x10 Shoulder Ext 6lb 2x10 Lateral step ups x 10 each   PATIENT EDUCATION:  Education details: exam findings, POC, HEP  Person educated: Patient Education method: Explanation, Demonstration, and Handouts Education comprehension: verbalized understanding, returned demonstration, and needs further education  HOME EXERCISE PROGRAM:  Access Code: [facility] URL: [email or web address] Date: 11/09/2023 Prepared by: Terrel Ferries  Exercises - Supine Bridge with Resistance Band  - 1 x daily - 5 x weekly - 2 sets - 10 reps - 2 seconds  hold - Standing Hamstring Curl with Resistance  - 1 x daily - 5 x weekly - 2 sets - 10 reps - 2 seconds   hold - Hooklying Hamstring Stretch with Strap  - 1 x daily - 7 x weekly - 1 sets - 3 reps - 30 hold - Tandem Stance in Corner  - 1 x daily - 7 x weekly - 1 sets - 6 reps - 30 seconds  hold - Tandem Walking with Counter Support  - 1 x daily - 7 x weekly - 1 sets - 4 reps - Standing 3-way Hip with Walker  - 1 x daily - 5 x weekly - 1-2 sets - 10 reps - Seated Knee Extension with Anchored Resistance  - 1 x daily - 5 x weekly - 1-2 sets - 10 reps - 2 seconds  hold - Tandem Stance  with Head Rotation  - 1 x daily - 7 x weekly - 1 sets - 4 reps - Tandem Stance with Head Nods   - 1 x daily - 7 x weekly - 1 sets - 4 reps - Tandem Stance with Eyes Closed in Corner  - 1 x daily - 7 x weekly - 1 sets - 4 reps - 30 seconds  hold   ASSESSMENT:  CLINICAL IMPRESSION:    Arrives today doing well, had a bit of soreness after TM pushes likely due to ongoing posterior chain weakness but we avoided this exercise today. Worked more on balance per her request. Still planning on DC next visit, encouraged her to come with questions and concerns that we can address at that time.               EVAL: Patient is a 68 y.o. F who was seen today for physical therapy evaluation and treatment for Diagnosis M25.562,G89.29 (ICD-10-CM) - Chronic pain of left knee M25.552 (ICD-10-CM) - Left hip pain. She does have a strong anterior chain bias and fairly limited strength in posterior chain musculature, also flexibility impairments and mild balance deficit. Anticipate she will benefit from skilled PT services to address all concerns and optimize pain free function.   OBJECTIVE IMPAIRMENTS: decreased activity tolerance, decreased balance, decreased mobility, difficulty walking, decreased ROM, decreased strength, impaired flexibility, improper body mechanics, and pain.   ACTIVITY LIMITATIONS: carrying, lifting, bending, standing, stairs, transfers, and locomotion level  PARTICIPATION LIMITATIONS: meal prep, cleaning, laundry, driving, shopping, community activity, occupation, and yard work  PERSONAL FACTORS: Age, Fitness, Past/current experiences, Profession, and Time since onset of injury/illness/exacerbation are also affecting patient's functional outcome.   REHAB POTENTIAL: Good  CLINICAL DECISION MAKING: Stable/uncomplicated  EVALUATION COMPLEXITY: Low   GOALS: Goals reviewed with patient? No  SHORT TERM GOALS: Target date: 10/28/2023   Will be compliant with appropriate progressive HEP   Baseline: Goal status: MET 10/14/23  2.  Will complete 5xSTS in 10 seconds or less no offshifting away from pain LE  Baseline:  Goal status: Met 7.74 sec  3.  Hamstring, piriformis, and hip flexor flexibility impairments to have resolved  Baseline:  Goal status: Met 10/30/23    LONG TERM GOALS: Target date: 11/18/2023  MMT to have improved by one grade in all weak groups  Baseline:  Goal status: MET 11/09/23  2.  Will be able to climb stairs reciprocally without increase in pain  Baseline:  Goal status: MET 10/14/23  3.  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  Will be able to maintain SLS for 20 seconds B to show improved functional balance  Baseline:  Goal status: Ongoing 11/02/23  5.  Will be able to get down to/up from the floor without increase in pain  Baseline:  Goal status: Progressing 10/30/23  6. PSFS to have improved to 10 Goal status: ONGOING 11/02/23     PLAN:  PT FREQUENCY: 2x/week  PT DURATION: 6 weeks  PLANNED INTERVENTIONS: 97110-Therapeutic exercises, 97530- Therapeutic activity, 97112- Neuromuscular re-education, 97535- Self Care, 16109- Manual therapy, and 97033- Ionotophoresis 4mg /ml Dexamethasone  PLAN FOR NEXT SESSION: DC next visit, final HEP additions and education PRN, review floor to stand   Terrel Ferries, PT, DPT 11/09/23 10:56 AM

## 2023-11-12 ENCOUNTER — Encounter: Payer: Self-pay | Admitting: Physical Therapy

## 2023-11-12 ENCOUNTER — Ambulatory Visit: Admitting: Physical Therapy

## 2023-11-12 DIAGNOSIS — M6281 Muscle weakness (generalized): Secondary | ICD-10-CM | POA: Diagnosis not present

## 2023-11-12 DIAGNOSIS — R2681 Unsteadiness on feet: Secondary | ICD-10-CM | POA: Diagnosis not present

## 2023-11-12 DIAGNOSIS — M79605 Pain in left leg: Secondary | ICD-10-CM

## 2023-11-12 NOTE — Therapy (Signed)
 OUTPATIENT PHYSICAL THERAPY LOWER EXTREMITY DISCHARGE   Patient Name: Tracy Blankenship MRN: [id number] DOB:September 19, 1955, 68 y.o., female Today's Date: 11/12/2023   PHYSICAL THERAPY DISCHARGE SUMMARY  Visits from Start of Care: 9  Current functional level related to goals / functional outcomes: See below    Remaining deficits: See below    Education / Equipment: See below    Patient agrees to discharge. Patient goals were partially met. Patient is being discharged due to being pleased with the current functional level.   END OF SESSION:  PT End of Session - 11/12/23 1048     Visit Number 9    Number of Visits 9    Authorization Type [employer]    Authorization Time Period 10/07/23 to 11/18/23    Progress Note Due on Visit 10    PT Start Time 1018    PT Stop Time 1046   DC all skilled needs met   PT Time Calculation (min) 28 min    Activity Tolerance Patient tolerated treatment well    Behavior During Therapy WFL for tasks assessed/performed               Past Medical History:  Diagnosis Date   Allergy    Chronic sinusitis    Constipation    Hyperglycemia    IC (interstitial cystitis)    Plantar fasciitis    Pneumonia    Stress reaction    Trigger finger    Past Surgical History:  Procedure Laterality Date   APPENDECTOMY     CESAREAN SECTION     X 2   WISDOM TOOTH EXTRACTION     Patient Active Problem List   Diagnosis Date Noted   Urinary frequency 10/21/2023   Sore throat 10/16/2023   Strep throat 09/01/2023   Family history of heart disease 05/11/2023   Hearing loss 11/11/2022   Abnormal chest x-ray 08/05/2022   Elevated blood pressure reading 07/28/2022   History of colon polyps 07/22/2022   Estrogen deficiency 07/25/2020   Eczema 02/09/2017   Mild hyperlipidemia 02/08/2016   H/O herpes zoster 01/31/2014   Vaginal dryness 10/29/2012   Routine general medical examination at a health care facility 03/30/2011   Urinary incontinence 01/07/2011    Allergic rhinitis 12/21/2009   INTERSTITIAL CYSTITIS 12/08/2006   Prediabetes 12/08/2006    PCP: Deri Fleet MD   REFERRING PROVIDER: Syliva Even, MD  REFERRING DIAG: Diagnosis [phone] (ICD-10-CM) - Chronic pain of left knee M25.552 (ICD-10-CM) - Left hip pain  THERAPY DIAG:  Muscle weakness (generalized)  Unsteadiness on feet  Pain in left leg  Rationale for Evaluation and Treatment: Rehabilitation  ONSET DATE: 3 months ago   SUBJECTIVE:   SUBJECTIVE STATEMENT:  I hit a snag, the spasm is back in my hip. It doesn't matter what I'm doing, it can be laying down or standing or walking, but it hits me very badly. Did several very heavy things lifting over the past 48 hours, what we did in PT didn't bother me.    EVAL: Pain started in my hip a couple of months, xray was ok and ended up doing some hip PT but after that I started having some terrible pain in my left knee. Everything functional like steps and getting out of car was very painful, ended up going back to MD office and saw Dr. Alease Hunter instead of Dr. Cleora Daft who did US  and gave cortisone shot. Shot has helped tremendously but I know when it wears off it will give  me problems again. I've also been having issues with spasms and sharp pains in the front of my left hip. 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 Work with plants too, similar activities. Got exercises from [facility], they gave me quad stretch, HS stretches, SKTC, figure four, hip abduction, clam shells (from [facility] per pt handout, not from formal PT). Have always had issues with getting up easily from the floor even before pain started.   PERTINENT HISTORY:  See above  PAIN:  Are you having pain? No 0/10 when hip doesn't spasm, can hit 9/10 for a few seconds and then goes away    PRECAUTIONS:  None  RED FLAGS: None   WEIGHT BEARING RESTRICTIONS: No  FALLS:  Has patient fallen in last 6 months? No  LIVING ENVIRONMENT: Lives with: lives with their family Lives in: House/apartment   OCCUPATION: retired but works with [employer], used to teach high school   PLOF: Independent, Independent with basic ADLs, Independent with gait, and Independent with transfers  PATIENT GOALS: be pain free with all functional activities, be able to get up off the floor, improve balance   NEXT MD VISIT: PRN with Dr. Alease Hunter   OBJECTIVE:  Note: Objective measures were completed at Evaluation unless otherwise noted.  DIAGNOSTIC FINDINGS:     Lower Venous DVT Study   Patient Name:  Tracy Blankenship  Date of Exam:   09/21/2023  Medical Rec #: [id number]       Accession #:    [id number]  Date of Birth: 03-24-1956      Patient Gender: F  Patient Age:   29 years  Exam Location:  [facility]  Procedure:      VAS US  LOWER EXTREMITY VENOUS (DVT)  Referring Phys: Garlan Juniper    ---------------------------------------------------------------------------  -----    Performing Technologist: Parke Boll RVS, RCS     Examination Guidelines:  A complete evaluation includes B-mode imaging, spectral Doppler, color  Doppler,  and power Doppler as needed of all accessible portions of each vessel.  Bilateral  testing is considered an integral part of a complete examination. Limited  examinations for reoccurring indications may be performed as noted. The  reflux  portion of the exam is performed with the patient in reverse  Trendelenburg.        +---------+---------------+---------+-----------+----------+--------------+   LEFT    CompressibilityPhasicitySpontaneityPropertiesThrombus  Aging  +---------+---------------+---------+-----------+----------+--------------+   CFV     Full                                                           +---------+---------------+---------+-----------+----------+--------------+   SFJ     Full                                                          +---------+---------------+---------+-----------+----------+--------------+   FV Prox  Full                                                          +---------+---------------+---------+-----------+----------+--------------+  FV Mid   Full                                                          +---------+---------------+---------+-----------+----------+--------------+   FV DistalFull                                                          +---------+---------------+---------+-----------+----------+--------------+   POP     Full                                                          +---------+---------------+---------+-----------+----------+--------------+   PTV     Full                                                          +---------+---------------+---------+-----------+----------+--------------+   PERO    Full                                                          +---------+---------------+---------+-----------+----------+--------------+   GSV     Full                                                          +---------+---------------+---------+-----------+----------+--------------+             Findings reported to routed in epic at 11:38 am.    Summary:  RIGHT:    - There is no evidence of deep vein thrombosis in the lower extremity.  - There is no evidence of superficial venous thrombosis.    LEFT:  - No evidence of common femoral vein obstruction.    CLINICAL DATA:  Chronic hip pain.   EXAM: DG HIP (WITH OR WITHOUT PELVIS) 3V LEFT   COMPARISON:  None Available.   FINDINGS: There is no evidence of hip fracture or dislocation. Pelvic ring is intact. There is no evidence of arthropathy or other focal bone abnormality.    IMPRESSION: Negative.  CLINICAL DATA:  Left knee pain for 3-4 weeks.  No known inj   EXAM: LEFT KNEE 3 VIEWS   COMPARISON:  None Available.   FINDINGS: No evidence of fracture, dislocation, or joint effusion. The alignment and joint spaces are normal. Minor peripheral spurring in the patellofemoral compartment. No erosions or focal bone abnormality. Soft tissues are unremarkable.   IMPRESSION: Minor degenerative peripheral spurring in the patellofemoral compartment.  PATIENT SURVEYS:    Patient-Specific Activity Scoring Scheme  0 represents "unable to  perform." 10 represents "able to perform at prior level. 0 1 2 3 4 5 6 7 8 9  10 (Date and Score)   Activity Eval   11/02/23 11/12/23  1. Getting up off the floor   8 generous   9 5  2. Steps   8 fearful   10 10  3. Getting in/out of garden tub  7 9 9   4.     5.     Score 7.6 9.3 8   Total score = sum of the activity scores/number of activities Minimum detectable change (90%CI) for average score = 2 points Minimum detectable change (90%CI) for single activity score = 3 points     COGNITION: Overall cognitive status: Within functional limits for tasks assessed     SENSATION: Not tested    MUSCLE LENGTH:  HS severe limitation B, piriformis mod limitation L/mild R    PALPATION:  HS and proximal gastroc tender L LE     LOWER EXTREMITY MMT:  MMT Right eval Left eval Right 11/09/23 Left 11/09/23  Hip flexion 4+ 4+ 4+ 4+  Hip extension 3+ 3+ 4+ 4+  Hip abduction 4+ 4+ 5 5  Hip adduction      Hip internal rotation      Hip external rotation      Knee flexion 4 4 5 5   Knee extension 4+ 4+ 5 5  Ankle dorsiflexion 4+ 4+    Ankle plantarflexion      Ankle inversion      Ankle eversion       (Blank rows = not tested)    FUNCTIONAL TESTS:  5 times sit to stand: 14 seconds no UEs  SLS 7 seconds R, 10 seconds L; 11/02/23  R 13 seconds, L15 seconds; 11/12/23- no more than 10 seconds B                                                                                                                                     TREATMENT DATE:     11/12/23  PSFS, SLS, floor work, goal review, care planning, HEP tweak to address quad and hip flexor flexibility   All pt questions/concerns addressed- sounds like hip pain may be from overdoing/recommended rest and moist heat with additional stretches in HEP updates, go to MD if this worsens or does not improve   Supine quad/hip flexor stretches 3x30 seconds L Figure four stretch 1x30 seconds L     11/09/23  Scifit bike L5x8 minutes seat 8 (Nustep not available)   MMT and education on progress with PT/towards goals, discussed footwear with good grip/proper shoe size to help reduce tripping/fall risk   One foot on BOSU/other on floor 3x30 seconds B  Alternating marches with opposite UE/LE flexion x10 B  Tandem stance with head turns x4 rounds each side Tandem stance EC 4x30 seconds Tandem stance head nods x4 rounds each side  11/05/23  Nustep seat 8 all four extremities, L6x4 minutes then dropped to L5 for an additional 4 minutes (8 minutes total)  Standing on blue foam pad with taps to top of BOSU x20  Tandem stance blue foam pad 3x30 seconds B 3 way taps blue foam pad 2x5 B  Forward and backward heel toe walks x3 laps solid surface   For mm strength: - 3 way hip green TB x10 B - seated LAQs green TB x12 B - 5# goblet hold + eccentric lower for quads x10  - TM belt pushes x10 B  11/02/23  SLS times, PSFS update  Nustep L5x8 minutes seat 8, all four extremities   Tandem stance solid surface 3x30 seconds B Tandem walking next to TM x4 laps  Sidesteps blue foam pad x4 laps  Tandem walks blue foam pad x4 laps  One foot on BOSU/other on ground 3x30 seconds B     10/30/23 NuStep L5x 6 mins  GOALS  LLE 7 sec, RLE 11 sec S2S OHP yellow ball 2x10  Leg ext 5# 2x10 HS curls 20# 2x10 Shoulder Ext 6lb  2x10 Lateral step ups x 10 each   PATIENT EDUCATION:  Education details: exam findings, POC, HEP  Person educated: Patient Education method: Explanation, Demonstration, and Handouts Education comprehension: verbalized understanding, returned demonstration, and needs further education  HOME EXERCISE PROGRAM:  Access Code: [facility] URL: [email or web address] Date: 11/12/2023 Prepared by: Terrel Ferries  Exercises - Supine Bridge with Resistance Band  - 1 x daily - 5 x weekly - 2 sets - 10 reps - 2 seconds  hold - Standing Hamstring Curl with Resistance  - 1 x daily - 5 x weekly - 2 sets - 10 reps - 2 seconds   hold - Hooklying Hamstring Stretch with Strap  - 1 x daily - 7 x weekly - 1 sets - 3 reps - 30 hold - Tandem Stance in Corner  - 1 x daily - 7 x weekly - 1 sets - 6 reps - 30 seconds  hold - Tandem Walking with Counter Support  - 1 x daily - 7 x weekly - 1 sets - 4 reps - Standing 3-way Hip with Walker  - 1 x daily - 5 x weekly - 1-2 sets - 10 reps - Seated Knee Extension with Anchored Resistance  - 1 x daily - 5 x weekly - 1-2 sets - 10 reps - 2 seconds  hold - Tandem Stance with Head Rotation  - 1 x daily - 7 x weekly - 1 sets - 4 reps - Tandem Stance with Head Nods   - 1 x daily - 7 x weekly - 1 sets - 4 reps - Tandem Stance with Eyes Closed in Corner  - 1 x daily - 7 x weekly - 1 sets - 4 reps - 30 seconds  hold - Supine Quadriceps Stretch with Strap on Table  - 1 x daily - 7 x weekly - 1 sets - 4 reps - 30 seconds  hold - Supine Figure 4 Piriformis Stretch  - 1 x daily - 7 x weekly - 1 sets - 4 reps - 30 seconds  hold   ASSESSMENT:  CLINICAL IMPRESSION:               Arrives with some extra hip pain after doing quite a lot of heavy activity within the past 48 hours- this is likely from overdoing and I educated on recovery/rest and care moving forward, as well  as additional exercises that may be helpful here. Has met the majority of goals. If her hip  continues to bother her, would recommend return to MD for further w/u. DC today.    EVAL: Patient is a 68 y.o. F who was seen today for physical therapy evaluation and treatment for Diagnosis M25.562,G89.29 (ICD-10-CM) - Chronic pain of left knee M25.552 (ICD-10-CM) - Left hip pain. She does have a strong anterior chain bias and fairly limited strength in posterior chain musculature, also flexibility impairments and mild balance deficit. Anticipate she will benefit from skilled PT services to address all concerns and optimize pain free function.   OBJECTIVE IMPAIRMENTS: decreased activity tolerance, decreased balance, decreased mobility, difficulty walking, decreased ROM, decreased strength, impaired flexibility, improper body mechanics, and pain.   ACTIVITY LIMITATIONS: carrying, lifting, bending, standing, stairs, transfers, and locomotion level  PARTICIPATION LIMITATIONS: meal prep, cleaning, laundry, driving, shopping, community activity, occupation, and yard work  PERSONAL FACTORS: Age, Fitness, Past/current experiences, Profession, and Time since onset of injury/illness/exacerbation are also affecting patient's functional outcome.   REHAB POTENTIAL: Good  CLINICAL DECISION MAKING: Stable/uncomplicated  EVALUATION COMPLEXITY: Low   GOALS: Goals reviewed with patient? No  SHORT TERM GOALS: Target date: 10/28/2023   Will be compliant with appropriate progressive HEP  Baseline: Goal status: MET 10/14/23  2.  Will complete 5xSTS in 10 seconds or less no offshifting away from pain LE  Baseline:  Goal status: Met 7.74 sec  3.  Hamstring, piriformis, and hip flexor flexibility impairments to have resolved  Baseline:  Goal status: Met 10/30/23    LONG TERM GOALS: Target date: 11/18/2023    MMT to have improved by one grade in all weak groups  Baseline:  Goal status: MET 11/09/23  2.  Will be able to climb stairs reciprocally without increase in pain  Baseline:  Goal status: MET  10/14/23  3.  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  Will be able to maintain SLS for 20 seconds B to show improved functional balance  Baseline:  Goal status: NOT MET 11/12/23  5.  Will be able to get down to/up from the floor without increase in pain  Baseline:  Goal status: MET 11/12/23  6. PSFS to have improved to 10 Goal status: NOT MET  11/12/23     PLAN:  PT FREQUENCY: 2x/week  PT DURATION: 6 weeks  PLANNED INTERVENTIONS: 97110-Therapeutic exercises, 97530- Therapeutic activity, 97112- Neuromuscular re-education, 97535- Self Care, 16109- Manual therapy, and 97033- Ionotophoresis 4mg /ml Dexamethasone  PLAN FOR NEXT SESSION: DC   Terrel Ferries, PT, DPT 11/12/23 10:48 AM

## 2023-11-17 ENCOUNTER — Other Ambulatory Visit: Payer: Self-pay

## 2023-11-17 ENCOUNTER — Encounter: Payer: Self-pay | Admitting: Family Medicine

## 2023-11-17 ENCOUNTER — Ambulatory Visit: Admitting: Family Medicine

## 2023-11-17 VITALS — BP 132/82 | HR 83 | Ht 62.0 in | Wt 161.0 lb

## 2023-11-17 DIAGNOSIS — M25552 Pain in left hip: Secondary | ICD-10-CM

## 2023-11-17 NOTE — Patient Instructions (Addendum)
 Thank you for coming in today.   You received an injection today. Seek immediate medical attention if the joint becomes red, extremely painful, or is oozing fluid.   Let me know how it goes  If not better, I will order a MRI-arthrogram

## 2023-11-17 NOTE — Progress Notes (Signed)
 I, Tracy Blankenship, CMA acting as a scribe for Tracy Lloyd, MD.  AMAURA Blankenship is a 68 y.o. female who presents to [facility] today for cont'd L leg pain. Pt was last seen by Dr. Lloyd on 09/16/23 and was given a L knee steroid injection and vasc US  was ordered. Pt also referred to PT, completing 9 visits (d/c 11/12/23).  Today, pt reports continued pain after 6 weeks of therapy. Did get some improvement initially but the pain has come back with a vengeance. Sx exacerbated after going to [facility] and then helping her husband move furniture into the back of the truck. She also notes scrubbing the shower on her hands and knees. Has trip planned to [place] this upcoming Thursday with plans to climb 250 steps. The knee feels OK. Hip pain is now more distal to previous visit, describes as sharp stabbing pain.  Pain is anterior proximal hip area.SABRA Position does not impact sx. Sx causing night disturbance. Denies n/t/w. Completed initial 2 weeks of Meloxicam , now taking Tylenol prn. Notes sx exacerbation of the past week, has been provided with HEP by PT.   Dx testing: 09/21/23 L LE vasc US  09/16/23 L knee XR  07/07/23 L hip XR  Pertinent review of systems: No fevers or chills  Relevant historical information: Hyperlipidemia   Exam:  BP 132/82   Pulse 83   Ht 5' 2 (1.575 m)   Wt 161 lb (73 kg)   SpO2 98%   BMI 29.45 kg/m  General: Well Developed, well nourished, and in no acute distress.   MSK: Left hip normal-appearing normal motion.  Intact strength.    Lab and Radiology Results  Procedure: Real-time Ultrasound Guided Injection of left hip joint femoral acetabular joint anterior approach Device: Philips Affiniti 50G/GE Logiq Images permanently stored and available for review in PACS Verbal informed consent obtained.  Discussed risks and benefits of procedure. Warned about infection, bleeding, hyperglycemia damage to structures among others. Patient  expresses understanding and agreement Time-out conducted.   Noted no overlying erythema, induration, or other signs of local infection.   Skin prepped in a sterile fashion.   Local anesthesia: Topical Ethyl chloride.   With sterile technique and under real time ultrasound guidance: 40 mg of Kenalog  and 2 mL of Marcaine injected into hip joint. Fluid seen entering the joint capsule.   Completed without difficulty   Pain moderately immediately resolved suggesting accurate placement of the medication.   Advised to call if fevers/chills, erythema, induration, drainage, or persistent bleeding.   Images permanently stored and available for review in the ultrasound unit.  Impression: Technically successful ultrasound guided injection.   EXAM: DG HIP (WITH OR WITHOUT PELVIS) 3V LEFT   COMPARISON:  None Available.   FINDINGS: There is no evidence of hip fracture or dislocation. Pelvic ring is intact. There is no evidence of arthropathy or other focal bone abnormality.   IMPRESSION: Negative.     Electronically Signed   By: Fonda Field M.D.   On: 07/17/2023 20:51   I, Tracy Blankenship, personally (independently) visualized and performed the interpretation of the images attached in this note.      Assessment and Plan: 68 y.o. female with left anterior hip pain.  Hip x-ray from February is benign appearing with no arthritis or other significant abnormalities in that area.  Etiology is unclear.  Plan for trial of diagnostic and therapeutic intra-articular injection.  If not improving we will proceed to  MRI arthrogram.  She already has completed 6 weeks of physical therapy which did not help enough for this problem.   PDMP not reviewed this encounter. Orders Placed This Encounter  Procedures   US  LIMITED JOINT SPACE STRUCTURES LOW LEFT(NO LINKED CHARGES)    Reason for Exam (SYMPTOM  OR DIAGNOSIS REQUIRED):   left hip pain    Preferred imaging location?:   [facility]   No orders of the defined types were placed in this encounter.    Discussed warning signs or symptoms. Please see discharge instructions. Patient expresses understanding.   The above documentation has been reviewed and is accurate and complete Tracy Blankenship, M.D.

## 2023-11-19 ENCOUNTER — Telehealth: Payer: Self-pay

## 2023-11-19 ENCOUNTER — Ambulatory Visit: Admitting: Family Medicine

## 2023-11-19 NOTE — Telephone Encounter (Signed)
 Pt called reporting flushing in her cheeks, her eyes feel tight and she suspects this is a reaction to the steroid injection she had on Tuesday. No fever. No sign of infection at the injection site  She notes a similar reaction 1-2 wks after knee injection back in April.  Pt notes she is leaving for a trip to the [facility] today and wanted to be sure this would resolve.   Verbal orders from Dr. Joane to call pt back and advised OK to take the Zyrtec. Provided Dr. Virgilio cell phone number to text if issues arise while she is out of town.

## 2023-12-20 ENCOUNTER — Ambulatory Visit
Admission: EM | Admit: 2023-12-20 | Discharge: 2023-12-20 | Disposition: A | Discharge status: Home / Self Care | Attending: Physician Assistant | Admitting: Physician Assistant

## 2023-12-20 ENCOUNTER — Other Ambulatory Visit: Payer: Self-pay

## 2023-12-20 ENCOUNTER — Ambulatory Visit

## 2023-12-20 DIAGNOSIS — L258 Unspecified contact dermatitis due to other agents: Secondary | ICD-10-CM

## 2023-12-20 MED ORDER — TRIAMCINOLONE ACETONIDE 0.1 % EX CREA: 1.0000 | TOPICAL_CREAM | Freq: Two times a day (BID) | CUTANEOUS | 0 refills | Status: AC

## 2023-12-20 NOTE — ED Triage Notes (Signed)
 Pt presents with a chief complaint of red rash on neck x 2 days. States she wore one of her older necklaces before her symptoms began. Little itching noted. OTC Hydrocortisone cream + older prescription of topical steroid applied with some improvement.

## 2023-12-20 NOTE — Discharge Instructions (Addendum)
 You were seen today for concerns of a rash around your neck.  This rash appears consistent with a condition called contact dermatitis which is essentially a reaction of the skin to an irritant or an allergen.  We treat this with topical steroids if it is limited to a small area of the body as well as antihistamines such as Zyrtec.  I have sent in a medication called triamcinolone  also known as Kenalog  for you to apply to the area twice per day until your symptoms resolve.  You can use this cream on the affected area for up to 2 weeks.  Please do not use it longer than this as this can cause skin thinning and changes to the skin.  If you develop any of the following please return here or go to the emergency room for more severe symptoms: Fever chills, difficulty breathing, sweating, sensation that your tongue is swelling or throat is closing, chest pain, the rash starts to spread over other areas of the body

## 2023-12-20 NOTE — ED Provider Notes (Signed)
 [facility]    CSN: [id number] Arrival date & time: 12/20/23  1017      History   Chief Complaint Chief Complaint  Patient presents with   Rash    HPI Tracy Blankenship is a 68 y.o. female.   HPI  Pt states she has a rash on her neck that has been present for about 2 days She states it is mildly uncomfortable but does not hurt or itch significantly  She denies fever, chills, increased warmth to the touch She thinks there may be some isolate lesion spreading to her chest She states on Thurs she wore a necklace that is not real metal and she has not worn it in sometime  She thinks this may have caused the reaction but is worried that this may be infectious as she is seeing a grandchild that has autoimmune illness Interventions: She has been using Hydrocortisone and leftover Triamcinolone  cream. She has taken a zyrtec yesterday  She is not sure if they are in date   She denies recent changes to diet, medications, fragrances, topicals, soaps or detergents   Past Medical History:  Diagnosis Date   Allergy    Chronic sinusitis    Constipation    Hyperglycemia    IC (interstitial cystitis)    Plantar fasciitis    Pneumonia    Stress reaction    Trigger finger     Patient Active Problem List   Diagnosis Date Noted   Urinary frequency 10/21/2023   Sore throat 10/16/2023   Strep throat 09/01/2023   Family history of heart disease 05/11/2023   Hearing loss 11/11/2022   Abnormal chest x-ray 08/05/2022   Elevated blood pressure reading 07/28/2022   History of colon polyps 07/22/2022   Estrogen deficiency 07/25/2020   Eczema 02/09/2017   Mild hyperlipidemia 02/08/2016   H/O herpes zoster 01/31/2014   Vaginal dryness 10/29/2012   Routine general medical examination at a health care facility 03/30/2011   Urinary incontinence 01/07/2011   Allergic rhinitis 12/21/2009   INTERSTITIAL CYSTITIS 12/08/2006   Prediabetes 12/08/2006    Past Surgical History:   Procedure Laterality Date   APPENDECTOMY     CESAREAN SECTION     X 2   WISDOM TOOTH EXTRACTION      OB History   No obstetric history on file.      Home Medications    Prior to Admission medications   Medication Sig Start Date End Date Taking? Authorizing Provider  triamcinolone  cream (KENALOG ) 0.1 % Apply 1 Application topically 2 (two) times daily. 12/20/23  Yes Elkin Belfield E, PA-C  Calcium Carbonate-Vit D-Min (CALCIUM 1200) 1200-1000 MG-UNIT CHEW Chew by mouth.    [provider]  Cetirizine HCl 10 MG CAPS Take 1 capsule by mouth daily as needed.    [provider]  famotidine  (PEPCID ) 20 MG tablet Take 1 tablet (20 mg total) by mouth 2 (two) times daily as needed for heartburn or indigestion. 07/29/21   Tower, Laine LABOR, MD  Multiple Vitamin (MULTIVITAMIN) tablet Take 1 tablet by mouth daily.    [provider]  polyethylene glycol powder (MIRALAX) 17 GM/SCOOP powder Take 1 Container by mouth daily as needed.    [provider]  Triamcinolone  Acetonide (NASACORT  ALLERGY 24HR NA) Place 2 sprays into the nose daily.    [provider]    Family History Family History  Problem Relation Age of Onset   Anxiety disorder Mother    Lupus Mother  Emphysema Mother    Coronary artery disease Mother    Lung cancer Mother    Hypertension Mother    Heart disease Mother    COPD Mother    Kidney disease Mother    Cardiomyopathy Father    Diabetes Father    Stroke Father    Colon cancer Neg Hx    Breast cancer Neg Hx    Colon polyps Neg Hx    Esophageal cancer Neg Hx    Rectal cancer Neg Hx    Stomach cancer Neg Hx     Social History Social History   Tobacco Use   Smoking status: Never   Smokeless tobacco: Never  Vaping Use   Vaping status: Never Used  Substance Use Topics   Alcohol use: Yes    Alcohol/week: 0.0 standard drinks of alcohol    Comment: wine -every 3-4 months   Drug use: No     Allergies   Claritin  [loratadine], Penicillins, Polymyxin b-trimethoprim, and Pseudoephedrine   Review of Systems Review of Systems  Constitutional:  Negative for chills and fever.  Skin:  Positive for rash.     Physical Exam Triage Vital Signs ED Triage Vitals  Encounter Vitals Group     BP 12/20/23 1025 (!) 153/83     Girls Systolic BP Percentile --      Girls Diastolic BP Percentile --      Boys Systolic BP Percentile --      Boys Diastolic BP Percentile --      Pulse Rate 12/20/23 1025 93     Resp 12/20/23 1025 18     Temp 12/20/23 1025 98 F (36.7 C)     Temp Source 12/20/23 1025 Oral     SpO2 12/20/23 1025 95 %     Weight 12/20/23 1031 161 lb (73 kg)     Height 12/20/23 1031 5' 3.5 (1.613 m)     Head Circumference --      Peak Flow --      Pain Score 12/20/23 1031 0     Pain Loc --      Pain Education --      Exclude from Growth Chart --    No data found.  Updated Vital Signs BP (!) 153/83 (BP Location: Right Arm)   Pulse 93   Temp 98 F (36.7 C) (Oral)   Resp 18   Ht 5' 3.5 (1.613 m)   Wt 161 lb (73 kg)   SpO2 95%   BMI 28.07 kg/m   Visual Acuity Right Eye Distance:   Left Eye Distance:   Bilateral Distance:    Right Eye Near:   Left Eye Near:    Bilateral Near:     Physical Exam Vitals reviewed.  Constitutional:      General: She is awake.     Appearance: Normal appearance. She is well-developed and well-groomed.  HENT:     Head: Normocephalic and atraumatic.  Eyes:     General: Lids are normal. Gaze aligned appropriately.     Extraocular Movements: Extraocular movements intact.     Conjunctiva/sclera: Conjunctivae normal.  Pulmonary:     Effort: Pulmonary effort is normal.  Musculoskeletal:     Cervical back: Normal range of motion. No pain with movement.  Skin:    General: Skin is warm and dry.     Findings: Rash present.     Comments: Patient has macular rash present along the base of the neck on the anterior aspect with  some isolated lesions along  the chest.  Rash is slightly erythematous and mildly scaling but does not appear to be draining, bleeding, excoriated  Neurological:     Mental Status: She is alert and oriented to person, place, and time.  Psychiatric:        Attention and Perception: Attention and perception normal.        Mood and Affect: Mood and affect normal.        Speech: Speech normal.        Behavior: Behavior normal. Behavior is cooperative.      UC Treatments / Results  Labs (all labs ordered are listed, but only abnormal results are displayed) Labs Reviewed - No data to display  EKG   Radiology No results found.  Procedures Procedures (including critical care time)  Medications Ordered in UC Medications - No data to display  Initial Impression / Assessment and Plan / UC Course  I have reviewed the triage vital signs and the nursing notes.  Pertinent labs & imaging results that were available during my care of the patient were reviewed by me and considered in my medical decision making (see chart for details).      Final Clinical Impressions(s) / UC Diagnoses   Final diagnoses:  Contact dermatitis due to other agent, unspecified contact dermatitis type   Patient presents today with concerns for mildly erythematous, macular rash present along the base of the neck.  She reports this has been ongoing for about 2 days and is not responding to topical hydrocortisone cream.  She did take a Zyrtec yesterday and tried expired triamcinolone  cream without notable relief.  Physical exam findings appear consistent with contact dermatitis.  Will send prescription in for Kenalog  0.1% cream to be applied to the area twice per day for no more than 2 weeks.  Recommend continued use of Zyrtec as needed for further symptomatic relief.  ED and return precautions reviewed and provided in after visit summary.  Follow-up as needed for progressing or persistent symptoms    Discharge Instructions      You were  seen today for concerns of a rash around your neck.  This rash appears consistent with a condition called contact dermatitis which is essentially a reaction of the skin to an irritant or an allergen.  We treat this with topical steroids if it is limited to a small area of the body as well as antihistamines such as Zyrtec.  I have sent in a medication called triamcinolone  also known as Kenalog  for you to apply to the area twice per day until your symptoms resolve.  You can use this cream on the affected area for up to 2 weeks.  Please do not use it longer than this as this can cause skin thinning and changes to the skin.  If you develop any of the following please return here or go to the emergency room for more severe symptoms: Fever chills, difficulty breathing, sweating, sensation that your tongue is swelling or throat is closing, chest pain, the rash starts to spread over other areas of the body     ED Prescriptions     Medication Sig Dispense Auth. Provider   triamcinolone  cream (KENALOG ) 0.1 % Apply 1 Application topically 2 (two) times daily. 30 g Artur Winningham E, PA-C      PDMP not reviewed this encounter.   Tracy Blankenship, Rocky BRAVO, PA-C 12/20/23 1052

## 2024-01-07 ENCOUNTER — Other Ambulatory Visit: Payer: Self-pay | Admitting: Family Medicine

## 2024-01-07 DIAGNOSIS — Z1231 Encounter for screening mammogram for malignant neoplasm of breast: Secondary | ICD-10-CM

## 2024-02-18 ENCOUNTER — Ambulatory Visit
Admission: RE | Admit: 2024-02-18 | Discharge: 2024-02-18 | Disposition: A | Discharge status: Home / Self Care | Source: Ambulatory Visit | Attending: Family Medicine | Admitting: Family Medicine

## 2024-02-18 DIAGNOSIS — Z1231 Encounter for screening mammogram for malignant neoplasm of breast: Secondary | ICD-10-CM | POA: Diagnosis not present

## 2024-02-22 ENCOUNTER — Ambulatory Visit: Payer: Self-pay | Admitting: Family Medicine

## 2024-02-29 DIAGNOSIS — L821 Other seborrheic keratosis: Secondary | ICD-10-CM | POA: Diagnosis not present

## 2024-02-29 DIAGNOSIS — D692 Other nonthrombocytopenic purpura: Secondary | ICD-10-CM | POA: Diagnosis not present

## 2024-04-06 ENCOUNTER — Encounter: Payer: Self-pay | Admitting: Family Medicine

## 2024-04-06 ENCOUNTER — Ambulatory Visit: Admitting: Family Medicine

## 2024-04-06 VITALS — BP 138/80 | HR 77 | Temp 98.5°F | Ht 63.5 in | Wt 162.5 lb

## 2024-04-06 DIAGNOSIS — E785 Hyperlipidemia, unspecified: Secondary | ICD-10-CM | POA: Diagnosis not present

## 2024-04-06 DIAGNOSIS — R030 Elevated blood-pressure reading, without diagnosis of hypertension: Secondary | ICD-10-CM

## 2024-04-06 DIAGNOSIS — R203 Hyperesthesia: Secondary | ICD-10-CM | POA: Insufficient documentation

## 2024-04-06 MED ORDER — ROSUVASTATIN CALCIUM 5 MG PO TABS: 5.0000 mg | ORAL_TABLET | Freq: Every day | ORAL | 3 refills | Status: AC

## 2024-04-06 NOTE — Patient Instructions (Addendum)
 Get your shingles vaccine as planned   The RSV vaccine is good to get if you have not had it   Aim for at least 30 minutes of exercise 5 days per week Go to the gym when you can Prioritize strength training   Avoid fragrances and chemicals  Also artificial colors /dyes  Do not use fabric softeners    Let's try a small dose of cholesterol medicine  Rosuvastatin (crestor) 5 mg daily in the evening  If you get side effects like muscle pain -stop it and let us  know   We will check cholesterol again in about six weeks

## 2024-04-06 NOTE — Assessment & Plan Note (Signed)
 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

## 2024-04-06 NOTE — Assessment & Plan Note (Signed)
 Better on 2nd check  BP: 138/80   Some increase in stressors Will watch Low threshold to treat if it increases or stays above goal   Encouraged to get to gym more often Continue watching diet for processed food

## 2024-04-06 NOTE — Assessment & Plan Note (Signed)
 Disc goals for lipids and reasons to control them Rev last labs with pt Rev low sat fat diet in detail With improved diet LDL is 137 and HDL 64  ASCVD risk 10.1%   Interested in treatment Will try crestor 5 mg daily in evening Call If any side effects  Re check 6 wk  Continue to cut back on fatty dairy/pizza and red meat when able   Encouraged to go to gym more often for exercise including strength training

## 2024-04-06 NOTE — Progress Notes (Signed)
 Subjective:    Patient ID: Tracy Blankenship, female    DOB: 03-Apr-1956, 68 y.o.   MRN: [id number]  HPI  Wt Readings from Last 3 Encounters:  04/06/24 162 lb 8 oz (73.7 kg)  12/20/23 161 lb (73 kg)  11/17/23 161 lb (73 kg)   28.33 kg/m  Vitals:   04/06/24 1152 04/06/24 1229  BP: (!) 148/72 138/80  Pulse: 77   Temp: 98.5 F (36.9 C)   SpO2: 97%    Pt presents with c/o Rash (resolved) has sensitive skin / reacts to things  Hyperlipidemia  General health questions   Question about shingrix vaccine - wants to get now    Did get her flu shot  Has prevnar 20 in 2024  Interested in RSV    Hyperlipidemia Lab Results  Component Value Date   CHOL 224 (H) 11/06/2023   HDL 64 11/06/2023   LDLCALC 137 (H) 11/06/2023   LDLDIRECT 126.5 12/17/2009   TRIG 117 11/06/2023   CHOLHDL 3.5 11/06/2023   Was planning to re check after diet change  The 10-year ASCVD risk score (Arnett DK, et al., 2019) is: 8.8%   Values used to calculate the score:     Age: 32 years     Clincally relevant sex: Female     Is Non-Hispanic African American: No     Diabetic: No     Tobacco smoker: No     Systolic Blood Pressure: 138 mmHg     Is BP treated: No     HDL Cholesterol: 64 mg/dL     Total Cholesterol: 224 mg/dL  Previous LDL was 857   Had cut out bacon  Cut out biscuits Cut out french fries and chips   Normal CT chest 08/2022    Beef-twice monthly  Fatty pork -very infrequent  Some grated cheese in eggs Pizza every 1-2 weeks  Max 2 eggs per day  No shellfish   Gym-[employer]  Had previously hurt her leg-had therapy   Very active also   Lab Results  Component Value Date   ALT 15 08/03/2023   AST 18 08/03/2023   ALKPHOS 77 08/03/2023   BILITOT 0.6 08/03/2023     Prediabetes Lab Results  Component Value Date   HGBA1C 5.6 08/03/2023   HGBA1C 5.6 07/29/2022   HGBA1C 5.6 07/23/2021   Lab Results  Component Value Date   TSH 1.12 08/03/2023   Daughter has mixed CT  disease  Also hashimotos   24 yo grandchild has a disorder that causes emotional outbursts  ? Name of the disorder - Pans disease/ happened after an infection   Really worries him        04/06/2024   12:01 PM 10/21/2023   11:51 AM 09/01/2023   12:31 PM 08/05/2023    3:36 PM 07/31/2022   11:48 AM  Depression screen PHQ 2/9  Decreased Interest 0 0 0 0 0  Down, Depressed, Hopeless 0 0 0 0 0  PHQ - 2 Score 0 0 0 0 0  Altered sleeping 0 0 1  0  Tired, decreased energy 1 0 0  0  Change in appetite 0 0 0  0  Feeling bad or failure about yourself  0 0 0  0  Trouble concentrating 0 0 0  0  Moving slowly or fidgety/restless 0 0 0  0  Suicidal thoughts 0 0 0  0  PHQ-9 Score 1 0  1   0   Difficult doing work/chores  Not difficult at all Not difficult at all   Not difficult at all     Data saved with a previous flowsheet row definition      Patient Active Problem List   Diagnosis Date Noted   Sensitive skin 04/06/2024   Family history of heart disease 05/11/2023   Hearing loss 11/11/2022   Abnormal chest x-ray 08/05/2022   Elevated blood pressure reading 07/28/2022   History of colon polyps 07/22/2022   Estrogen deficiency 07/25/2020   Eczema 02/09/2017   Mild hyperlipidemia 02/08/2016   H/O herpes zoster 01/31/2014   Vaginal dryness 10/29/2012   Routine general medical examination at a health care facility 03/30/2011   Urinary incontinence 01/07/2011   Allergic rhinitis 12/21/2009   INTERSTITIAL CYSTITIS 12/08/2006   Prediabetes 12/08/2006   Past Medical History:  Diagnosis Date   Allergy    Chronic sinusitis    Constipation    Hyperglycemia    IC (interstitial cystitis)    Plantar fasciitis    Pneumonia    Stress reaction    Trigger finger    Past Surgical History:  Procedure Laterality Date   APPENDECTOMY     CESAREAN SECTION     X 2   WISDOM TOOTH EXTRACTION     Social History   Tobacco Use   Smoking status: Never   Smokeless tobacco: Never  Vaping Use    Vaping status: Never Used  Substance Use Topics   Alcohol use: Yes    Alcohol/week: 0.0 standard drinks of alcohol    Comment: wine -every 3-4 months   Drug use: No   Family History  Problem Relation Age of Onset   Anxiety disorder Mother    Lupus Mother    Emphysema Mother    Coronary artery disease Mother    Lung cancer Mother    Hypertension Mother    Heart disease Mother    COPD Mother    Kidney disease Mother    Cardiomyopathy Father    Diabetes Father    Stroke Father    Colon cancer Neg Hx    Breast cancer Neg Hx    Colon polyps Neg Hx    Esophageal cancer Neg Hx    Rectal cancer Neg Hx    Stomach cancer Neg Hx    Allergies  Allergen Reactions   Claritin [Loratadine] Other (See Comments)    Felt jittery   Penicillins     REACTION: ? allergy   Polymyxin B-Trimethoprim     Made eye swelling worse    Pseudoephedrine     REACTION: makes her hyper   Current Outpatient Medications on File Prior to Visit  Medication Sig Dispense Refill   Calcium Carbonate-Vit D-Min (CALCIUM 1200) 1200-1000 MG-UNIT CHEW Chew by mouth.     Cetirizine HCl 10 MG CAPS Take 1 capsule by mouth daily as needed.     famotidine  (PEPCID ) 20 MG tablet Take 1 tablet (20 mg total) by mouth 2 (two) times daily as needed for heartburn or indigestion. 60 tablet 11   Multiple Vitamin (MULTIVITAMIN) tablet Take 1 tablet by mouth daily.     polyethylene glycol powder (MIRALAX) 17 GM/SCOOP powder Take 1 Container by mouth daily as needed.     Triamcinolone  Acetonide (NASACORT  ALLERGY 24HR NA) Place 2 sprays into the nose daily.     triamcinolone  cream (KENALOG ) 0.1 % Apply 1 Application topically 2 (two) times daily. 30 g 0   No current facility-administered medications on file prior to visit.  Review of Systems  Constitutional:  Negative for fatigue and fever.  Respiratory:  Negative for cough and shortness of breath.   Cardiovascular:  Negative for chest pain, palpitations and leg swelling.   Musculoskeletal:        Aches and pains   Skin:  Positive for rash.       Sensitive skin  Gets rashes easily  Psychiatric/Behavioral:  The patient is nervous/anxious.        Worry about family members        Objective:   Physical Exam Constitutional:      General: She is not in acute distress.    Appearance: Normal appearance. She is well-developed. She is not ill-appearing or diaphoretic.  HENT:     Head: Normocephalic and atraumatic.  Eyes:     Conjunctiva/sclera: Conjunctivae normal.     Pupils: Pupils are equal, round, and reactive to light.  Neck:     Thyroid : No thyromegaly.     Vascular: No JVD.  Cardiovascular:     Rate and Rhythm: Normal rate and regular rhythm.  Pulmonary:     Effort: Pulmonary effort is normal. No respiratory distress.     Breath sounds: Normal breath sounds.  Abdominal:     General: There is no distension or abdominal bruit.     Palpations: Abdomen is soft.  Musculoskeletal:     Cervical back: Normal range of motion and neck supple.     Right lower leg: No edema.     Left lower leg: No edema.  Skin:    General: Skin is warm and dry.     Coloration: Skin is not pale.     Findings: No rash.  Neurological:     Mental Status: She is alert.     Coordination: Coordination normal.     Deep Tendon Reflexes: Reflexes are normal and symmetric. Reflexes normal.  Psychiatric:        Mood and Affect: Mood normal.           Assessment & Plan:   Problem List Items Addressed This Visit       Other   Sensitive skin   Reacts strongly to environmental triggers Encouraged to stay away from fragrances and dyes  Also stop using fabric softener   Has triamcinolone  cream  Cetrizine   Uses her own pillow at hotel, etc Has d/w her dermatologist  Has seen allergist in past for different issue  may be option later   No rash today      Mild hyperlipidemia - Primary   Disc goals for lipids and reasons to control them Rev last labs with  pt Rev low sat fat diet in detail With improved diet LDL is 137 and HDL 64  ASCVD risk 10.1%   Interested in treatment Will try crestor 5 mg daily in evening Call If any side effects  Re check 6 wk  Continue to cut back on fatty dairy/pizza and red meat when able   Encouraged to go to gym more often for exercise including strength training       Relevant Medications   rosuvastatin (CRESTOR) 5 MG tablet   Elevated blood pressure reading   Better on 2nd check  BP: 138/80   Some increase in stressors Will watch Low threshold to treat if it increases or stays above goal   Encouraged to get to gym more often Continue watching diet for processed food

## 2024-04-25 NOTE — Progress Notes (Unsigned)
 Ben Shameca Landen D.[facility] [address] [place] [address] Phone: [phone]   Assessment and Plan:     1. Acute left ankle pain (Primary) 2. Tendonitis, Achilles, left -Acute, initial visit - Consistent with Achilles tendinitis likely flared by increased walking, stairs 1 month ago.  Overall moderate improvement with relative rest, intermittent NSAID use - Start meloxicam  15 mg daily x2 weeks.  If still having pain after 2 weeks, complete 3rd-week of NSAID. May use remaining NSAID as needed once daily for pain control.  Do not to use additional over-the-counter NSAIDs (ibuprofen, naproxen, Advil, Aleve, etc.) while taking prescription NSAIDs.  May use Tylenol [phone] mg 2 to 3 times a day for breakthrough pain. - Start HEP for Achilles tendinitis - X-ray obtained in clinic.  My interpretation: No acute fracture or dislocation.  15 additional minutes spent for educating Therapeutic Home Exercise Program.  This included exercises focusing on stretching, strengthening, with focus on eccentric aspects.   Long term goals include an improvement in range of motion, strength, endurance as well as avoiding reinjury. Patient's frequency would include in 1-2 times a day, 3-5 times a week for a duration of 6-12 weeks. Proper technique shown and discussed handout in great detail with ATC.  All questions were discussed and answered.      Pertinent previous records reviewed include none   Follow Up: As needed if no improvement or worsening of symptoms.  Could discuss prednisone  course versus nitrogen patch versus ECSWT versus boot versus physical therapy   Subjective:   I, Claretha Schimke am a scribe for Dr. Leonce.   Chief Complaint: back of ankle pain   HPI:   04/26/24 Patient is a 68 year old female presenting to [facility] for ankle pain. Patient states it started  on November 16th went to [employer] in [place]. 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 The pain started on November 19th. Today it seems to be fine. Took Tylenol one or twice. Took left off meloxicam  from previous prescription for the first two week. Did heat and ice as well. Pain was so bad at one point that she could not bare weight on the foot. Pain in is the back of the heel at the achilles.   Ankle pain Swelling:no Mechanical: Weakness: no Numbness/tingling:no Aggravates: walking, sitting, stairs Tried: ice, heat, tylenol, meloxicam     Relevant Historical Information: none  Additional pertinent review of systems negative.   Current Outpatient Medications:    meloxicam  (MOBIC ) 15 MG tablet, Take 1 tablet (15 mg total) by mouth daily., Disp: 30 tablet, Rfl: 0   Calcium  Carbonate-Vit D-Min (CALCIUM  1200) 1200-1000 MG-UNIT CHEW, Chew by mouth., Disp: , Rfl:    Cetirizine HCl 10 MG CAPS, Take 1 capsule by mouth daily as needed., Disp: , Rfl:    famotidine  (PEPCID ) 20 MG tablet, Take 1 tablet (20 mg total) by mouth 2 (two) times daily as needed for heartburn or indigestion., Disp: 60 tablet, Rfl: 11   Multiple Vitamin (MULTIVITAMIN) tablet, Take 1 tablet by mouth daily., Disp: , Rfl:    polyethylene glycol powder (MIRALAX) 17 GM/SCOOP powder, Take 1 Container by mouth daily as needed., Disp: , Rfl:    rosuvastatin  (CRESTOR ) 5 MG tablet, Take 1 tablet (5 mg total) by mouth daily. In the evening, Disp: 90 tablet, Rfl: 3   Triamcinolone  Acetonide (NASACORT  ALLERGY 24HR NA), Place 2 sprays into the nose daily.,  Disp: , Rfl:    triamcinolone  cream (KENALOG ) 0.1 %, Apply 1 Application topically 2 (two) times daily., Disp: 30 g, Rfl: 0   Objective:     Vitals:   04/26/24 1047  BP: 122/70  Pulse: 74  SpO2: 96%  Weight: 165 lb (74.8 kg)  Height: 5' 3.5 (1.613 m)      Body mass index is 28.77 kg/m.    Physical Exam:    Gen: Appears well, nad, nontoxic and pleasant Psych: Alert and oriented, appropriate mood and  affect Neuro: sensation intact, strength is 5/5 with df/pf/inv/ev, muscle tone wnl Skin: no susupicious lesions or rashes  Left ankle:  No deformity, no swelling or effusion The Achilles NTTP over fibular head, lat mal, medial mal,  , navicular, base of 5th, ATFL, CFL, deltoid, calcaneous or midfoot ROM DF 30, PF 45, inv/ev intact Negative ant drawer, talar tilt, rotation test, squeeze test. Neg thompson No pain with resisted inversion or eversion  Pain with resisted plantarflexion  Electronically signed by:  Odis Mace [facility] 11:54 AM 04/26/24

## 2024-04-26 ENCOUNTER — Ambulatory Visit

## 2024-04-26 ENCOUNTER — Ambulatory Visit: Admitting: Sports Medicine

## 2024-04-26 VITALS — BP 122/70 | HR 74 | Ht 63.5 in | Wt 165.0 lb

## 2024-04-26 DIAGNOSIS — M25572 Pain in left ankle and joints of left foot: Secondary | ICD-10-CM

## 2024-04-26 DIAGNOSIS — M7662 Achilles tendinitis, left leg: Secondary | ICD-10-CM | POA: Diagnosis not present

## 2024-04-26 DIAGNOSIS — M2012 Hallux valgus (acquired), left foot: Secondary | ICD-10-CM | POA: Diagnosis not present

## 2024-04-26 DIAGNOSIS — M19072 Primary osteoarthritis, left ankle and foot: Secondary | ICD-10-CM | POA: Diagnosis not present

## 2024-04-26 DIAGNOSIS — M79672 Pain in left foot: Secondary | ICD-10-CM | POA: Diagnosis not present

## 2024-04-26 MED ORDER — MELOXICAM 15 MG PO TABS: 15.0000 mg | ORAL_TABLET | Freq: Every day | ORAL | 0 refills | Status: AC

## 2024-04-26 NOTE — Patient Instructions (Signed)
-   Start meloxicam  15 mg daily x2 weeks.  If still having pain after 2 weeks, complete 3rd-week of NSAID. May use remaining NSAID as needed once daily for pain control.  Do not to use additional over-the-counter NSAIDs (ibuprofen, naproxen, Advil, Aleve, etc.) while taking prescription NSAIDs.  May use Tylenol [phone] mg 2 to 3 times a day for breakthrough pain.  Hep for Achilles tendonitis Recommend Husband call for a refill of his meloxicam . Follow up as needed.

## 2024-05-02 ENCOUNTER — Ambulatory Visit: Admitting: Family Medicine

## 2024-05-03 ENCOUNTER — Ambulatory Visit: Payer: Self-pay | Admitting: Sports Medicine

## 2024-05-15 ENCOUNTER — Telehealth: Payer: Self-pay | Admitting: Family Medicine

## 2024-05-15 DIAGNOSIS — E785 Hyperlipidemia, unspecified: Secondary | ICD-10-CM

## 2024-05-15 NOTE — Telephone Encounter (Signed)
-----   Message from Harlene Du sent at 04/29/2024  9:52 AM EST ----- Regarding: Lab Mon 05/16/24 Hello,   Patient has a lab appointment for cholesterol. Can we get lab orders please.   Thanks

## 2024-05-16 ENCOUNTER — Other Ambulatory Visit

## 2024-05-23 ENCOUNTER — Encounter: Payer: Self-pay | Admitting: Family Medicine

## 2024-05-23 ENCOUNTER — Other Ambulatory Visit

## 2024-05-24 ENCOUNTER — Ambulatory Visit: Admitting: Family Medicine

## 2024-05-25 ENCOUNTER — Encounter: Payer: Self-pay | Admitting: Family Medicine

## 2024-05-25 ENCOUNTER — Ambulatory Visit (INDEPENDENT_AMBULATORY_CARE_PROVIDER_SITE_OTHER): Admitting: Family Medicine

## 2024-05-25 VITALS — BP 130/80 | HR 83 | Temp 98.7°F | Ht 63.5 in | Wt 163.2 lb

## 2024-05-25 DIAGNOSIS — J029 Acute pharyngitis, unspecified: Secondary | ICD-10-CM

## 2024-05-25 DIAGNOSIS — E785 Hyperlipidemia, unspecified: Secondary | ICD-10-CM | POA: Diagnosis not present

## 2024-05-25 DIAGNOSIS — J020 Streptococcal pharyngitis: Secondary | ICD-10-CM

## 2024-05-25 LAB — LIPID PANEL
Cholesterol: 122 mg/dL (ref 28–200)
HDL: 54.1 mg/dL
LDL Cholesterol: 49 mg/dL (ref 10–99)
NonHDL: 67.4
Total CHOL/HDL Ratio: 2
Triglycerides: 91 mg/dL (ref 10.0–149.0)
VLDL: 18.2 mg/dL (ref 0.0–40.0)

## 2024-05-25 LAB — POC INFLUENZA A&B (BINAX/QUICKVUE): Influenza A, POC: NEGATIVE

## 2024-05-25 LAB — POC COVID19 BINAXNOW: SARS Coronavirus 2 Ag: NEGATIVE

## 2024-05-25 LAB — ALT: ALT: 14 U/L (ref 3–35)

## 2024-05-25 LAB — AST: AST: 17 U/L (ref 5–37)

## 2024-05-25 LAB — POCT RAPID STREP A (OFFICE): Rapid Strep A Screen: POSITIVE — AB

## 2024-05-25 MED ORDER — AZITHROMYCIN 250 MG PO TABS: ORAL_TABLET | ORAL | 0 refills | Status: AC

## 2024-05-25 NOTE — Progress Notes (Signed)
 "   Patient ID: Tracy Blankenship, female    DOB: [birth year], 68 y.o.   MRN: [id number]  This visit was conducted in person.  BP 130/80   Pulse 83   Temp 98.7 F (37.1 C) (Oral)   Ht 5' 3.5 (1.613 m)   Wt 163 lb 4 oz (74 kg)   SpO2 97%   BMI 28.46 kg/m    CC:  Chief Complaint  Patient presents with   Medical Management of Chronic Issues    Pt here for sore throat x 3 days. Pt has tried Dayquil/Nyquil, nasal spray, gargle warm salt water. Pt took a home Covid test on 11/29 and it came back negative.    Subjective:   HPI: Tracy Blankenship is a 68 y.o. female presenting on 05/25/2024 for Medical Management of Chronic Issues (Pt here for sore throat x 3 days. Pt has tried Dayquil/Nyquil, nasal spray, gargle warm salt water. Pt took a home Covid test on 11/29 and it came back negative.)   Date of onset: 3 days Initial symptoms included  ST Symptoms progressed to nasal congestion, ear fullness, no ear pain, no face pain.  No fever  Minimal cough.  No SOB, no wheeze.   Sick contacts:  none but at parties, funeral. COVID testing:   negative  12/29, expired.     She has tried to treat with  Dayquil, Nyquil, salt water gargle nasal spray.     No history of chronic lung disease such as asthma or COPD. Non-smoker.       Relevant past medical, surgical, family and social history reviewed and updated as indicated. Interim medical history since our last visit reviewed. Allergies and medications reviewed and updated. Outpatient Medications Prior to Visit  Medication Sig Dispense Refill   Calcium  Carbonate-Vit D-Min (CALCIUM  1200) 1200-1000 MG-UNIT CHEW Chew by mouth.     Cetirizine HCl 10 MG CAPS Take 1 capsule by mouth daily as needed.     famotidine  (PEPCID ) 20 MG tablet Take 1 tablet (20 mg total) by mouth 2 (two) times daily as needed for heartburn or indigestion. 60 tablet 11   meloxicam  (MOBIC ) 15 MG tablet Take 1 tablet (15 mg total) by mouth daily. 30 tablet 0   Multiple  Vitamin (MULTIVITAMIN) tablet Take 1 tablet by mouth daily.     polyethylene glycol powder (MIRALAX) 17 GM/SCOOP powder Take 1 Container by mouth daily as needed.     rosuvastatin  (CRESTOR ) 5 MG tablet Take 1 tablet (5 mg total) by mouth daily. In the evening 90 tablet 3   Triamcinolone  Acetonide (NASACORT  ALLERGY 24HR NA) Place 2 sprays into the nose daily.     triamcinolone  cream (KENALOG ) 0.1 % Apply 1 Application topically 2 (two) times daily. 30 g 0   No facility-administered medications prior to visit.     Per HPI unless specifically indicated in ROS section below Review of Systems  Constitutional:  Positive for fatigue. Negative for fever.  HENT:  Positive for congestion and sore throat. Negative for ear discharge, ear pain and sinus pressure.   Eyes:  Negative for pain.  Respiratory:  Negative for shortness of breath.   Cardiovascular:  Negative for chest pain, palpitations and leg swelling.  Gastrointestinal:  Negative for abdominal pain.  Genitourinary:  Negative for dysuria and vaginal bleeding.  Musculoskeletal:  Negative for back pain.  Neurological:  Negative for syncope, light-headedness and headaches.  Psychiatric/Behavioral:  Negative for dysphoric mood.    Objective:  BP 130/80  Pulse 83   Temp 98.7 F (37.1 C) (Oral)   Ht 5' 3.5 (1.613 m)   Wt 163 lb 4 oz (74 kg)   SpO2 97%   BMI 28.46 kg/m   Wt Readings from Last 3 Encounters:  05/25/24 163 lb 4 oz (74 kg)  04/26/24 165 lb (74.8 kg)  04/06/24 162 lb 8 oz (73.7 kg)      Physical Exam Constitutional:      General: She is not in acute distress.    Appearance: She is well-developed. She is not ill-appearing or toxic-appearing.  HENT:     Head: Normocephalic.     Right Ear: Hearing, tympanic membrane, ear canal and external ear normal. Tympanic membrane is not erythematous, retracted or bulging.     Left Ear: Hearing, tympanic membrane, ear canal and external ear normal. Tympanic membrane is not  erythematous, retracted or bulging.     Nose: No mucosal edema or rhinorrhea.     Right Sinus: No maxillary sinus tenderness or frontal sinus tenderness.     Left Sinus: No maxillary sinus tenderness or frontal sinus tenderness.     Mouth/Throat:     Pharynx: Uvula midline. Posterior oropharyngeal erythema present.  Eyes:     General: Lids are normal. Lids are everted, no foreign bodies appreciated.     Conjunctiva/sclera: Conjunctivae normal.     Pupils: Pupils are equal, round, and reactive to light.  Neck:     Thyroid : No thyroid  mass or thyromegaly.     Vascular: No carotid bruit.     Trachea: Trachea normal.  Cardiovascular:     Rate and Rhythm: Normal rate and regular rhythm.     Pulses: Normal pulses.     Heart sounds: Normal heart sounds, S1 normal and S2 normal. No murmur heard.    No friction rub. No gallop.  Pulmonary:     Effort: Pulmonary effort is normal. No tachypnea or respiratory distress.     Breath sounds: Normal breath sounds. No decreased breath sounds, wheezing, rhonchi or rales.  Musculoskeletal:     Cervical back: Normal range of motion and neck supple.  Skin:    General: Skin is warm and dry.     Findings: No rash.  Neurological:     Mental Status: She is alert.  Psychiatric:        Mood and Affect: Mood is not anxious or depressed.        Speech: Speech normal.        Behavior: Behavior normal. Behavior is cooperative.        Judgment: Judgment normal.       Results for orders placed or performed in visit on 11/06/23  Lipid panel   Collection Time: 11/06/23  9:02 AM  Result Value Ref Range   Cholesterol 224 (H) <200 mg/dL   HDL 64 > OR = 50 mg/dL   Triglycerides 882 <849 mg/dL   LDL Cholesterol (Calc) 137 (H) mg/dL (calc)   Total CHOL/HDL Ratio 3.5 <5.0 (calc)   Non-HDL Cholesterol (Calc) 160 (H) <130 mg/dL (calc)    Assessment and Plan  Sore throat -     POCT rapid strep A -     POC COVID-19 BinaxNow -     POC Influenza  A&B(BINAX/QUICKVUE)  Strep pharyngitis Assessment & Plan: Acute, Rapid strep test is positive.  Negative COVID and FLU rapid test. Recommend ibuprofen 800 mg 3 times daily for sore throat pain.  Start amoxicillin 500 mg 2 tablets twice daily  x 10 days. Symptomatic care. Push liquids to avoid dehydration.   Return and ER precautions provided.    Mild hyperlipidemia -     Lipid panel -     AST -     ALT    No follow-ups on file.   Greig Ring, MD  "

## 2024-05-25 NOTE — Assessment & Plan Note (Addendum)
 Acute, Rapid strep test is positive.  Negative COVID and FLU rapid test. Recommend ibuprofen 800 mg 3 times daily for sore throat pain.   Given PCN allergy will treat with Z-pack. Symptomatic care. Push liquids to avoid dehydration.   Return and ER precautions provided.

## 2024-05-26 ENCOUNTER — Ambulatory Visit: Payer: Self-pay | Admitting: Family Medicine

## 2024-06-11 ENCOUNTER — Emergency Department (HOSPITAL_BASED_OUTPATIENT_CLINIC_OR_DEPARTMENT_OTHER)
Admission: EM | Admit: 2024-06-11 | Discharge: 2024-06-11 | Disposition: A | Discharge status: Home / Self Care | Attending: Emergency Medicine | Admitting: Emergency Medicine

## 2024-06-11 ENCOUNTER — Encounter (HOSPITAL_BASED_OUTPATIENT_CLINIC_OR_DEPARTMENT_OTHER): Payer: Self-pay | Admitting: Emergency Medicine

## 2024-06-11 ENCOUNTER — Emergency Department (HOSPITAL_BASED_OUTPATIENT_CLINIC_OR_DEPARTMENT_OTHER): Admit: 2024-06-11 | Discharge: 2024-06-11 | Disposition: A | Attending: Emergency Medicine

## 2024-06-11 DIAGNOSIS — M79605 Pain in left leg: Secondary | ICD-10-CM | POA: Insufficient documentation

## 2024-06-11 LAB — CK: Total CK: 42 U/L (ref 38–234)

## 2024-06-11 LAB — CBC WITH DIFFERENTIAL/PLATELET
Abs Immature Granulocytes: 0.03 K/uL (ref 0.00–0.07)
Basophils Absolute: 0 K/uL (ref 0.0–0.1)
Basophils Relative: 0 %
Eosinophils Absolute: 0.2 K/uL (ref 0.0–0.5)
Eosinophils Relative: 2 %
HCT: 42.9 % (ref 36.0–46.0)
Hemoglobin: 14.2 g/dL (ref 12.0–15.0)
Immature Granulocytes: 0 %
Lymphocytes Relative: 23 %
Lymphs Abs: 2 K/uL (ref 0.7–4.0)
MCH: 28.6 pg (ref 26.0–34.0)
MCHC: 33.1 g/dL (ref 30.0–36.0)
MCV: 86.3 fL (ref 80.0–100.0)
Monocytes Absolute: 0.6 K/uL (ref 0.1–1.0)
Monocytes Relative: 7 %
Neutro Abs: 5.7 K/uL (ref 1.7–7.7)
Neutrophils Relative %: 68 %
Platelets: 307 K/uL (ref 150–400)
RBC: 4.97 MIL/uL (ref 3.87–5.11)
RDW: 12.7 % (ref 11.5–15.5)
WBC: 8.5 K/uL (ref 4.0–10.5)
nRBC: 0 % (ref 0.0–0.2)

## 2024-06-11 LAB — BASIC METABOLIC PANEL WITH GFR
Anion gap: 11 (ref 5–15)
BUN: 16 mg/dL (ref 8–23)
CO2: 27 mmol/L (ref 22–32)
Calcium: 9.5 mg/dL (ref 8.9–10.3)
Chloride: 104 mmol/L (ref 98–111)
Creatinine, Ser: 0.84 mg/dL (ref 0.44–1.00)
GFR, Estimated: >60 mL/min
Glucose, Bld: 108 mg/dL — ABNORMAL HIGH (ref 70–99)
Potassium: 3.8 mmol/L (ref 3.5–5.1)
Sodium: 141 mmol/L (ref 135–145)

## 2024-06-11 MED ORDER — KETOROLAC TROMETHAMINE 15 MG/ML IJ SOLN
15.0000 mg | Freq: Once | INTRAMUSCULAR | Status: AC
  Administered 2024-06-11: 15 mg via INTRAVENOUS
  Filled 2024-06-11: qty 1

## 2024-06-11 MED ORDER — OXYCODONE-ACETAMINOPHEN 5-325 MG PO TABS: 1.0000 | ORAL_TABLET | Freq: Once | ORAL | Status: DC

## 2024-06-11 NOTE — ED Triage Notes (Signed)
 Left leg pain, having issues for months, seen and Tx by PCP. Worse starting 2 days ago. Unsure if related to new meds.

## 2024-06-11 NOTE — ED Provider Notes (Signed)
 " [facility] Provider Note  CSN: [id number] Arrival date & time: 06/11/24 0308  Chief Complaint(s) Leg Pain  HPI Tracy Blankenship is a 69 y.o. female without relevant past medical history presenting with left leg pain.  She reports that she has left leg pain for months.  Reports over the past few days has been worse.  Has been able to ambulate but it is very painful.  No fevers or chills or specific injury.  No back pain.   Past Medical History Past Medical History:  Diagnosis Date   Allergy    Chronic sinusitis    Constipation    Hyperglycemia    IC (interstitial cystitis)    Plantar fasciitis    Pneumonia    Stress reaction    Trigger finger    Patient Active Problem List   Diagnosis Date Noted   Sensitive skin 04/06/2024   Strep pharyngitis 09/01/2023   Family history of heart disease 05/11/2023   Hearing loss 11/11/2022   Abnormal chest x-ray 08/05/2022   Elevated blood pressure reading 07/28/2022   History of colon polyps 07/22/2022   Estrogen deficiency 07/25/2020   Eczema 02/09/2017   Mild hyperlipidemia 02/08/2016   H/O herpes zoster 01/31/2014   Vaginal dryness 10/29/2012   Routine general medical examination at a health care facility 03/30/2011   Urinary incontinence 01/07/2011   Allergic rhinitis 12/21/2009   INTERSTITIAL CYSTITIS 12/08/2006   Prediabetes 12/08/2006   Home Medication(s) Prior to Admission medications  Medication Sig Start Date End Date Taking? Authorizing Provider  Calcium  Carbonate-Vit D-Min (CALCIUM  1200) 1200-1000 MG-UNIT CHEW Chew by mouth.    [provider]  Cetirizine HCl 10 MG CAPS Take 1 capsule by mouth daily as needed.    [provider]  famotidine  (PEPCID ) 20 MG tablet Take 1 tablet (20 mg total) by mouth 2 (two) times daily as needed for heartburn or indigestion. 07/29/21   Tower, Laine LABOR, MD  meloxicam  (MOBIC ) 15 MG tablet Take 1 tablet (15 mg total) by mouth daily.  04/26/24   Leonce Katz, DO  Multiple Vitamin (MULTIVITAMIN) tablet Take 1 tablet by mouth daily.    [provider]  polyethylene glycol powder (MIRALAX) 17 GM/SCOOP powder Take 1 Container by mouth daily as needed.    [provider]  rosuvastatin  (CRESTOR ) 5 MG tablet Take 1 tablet (5 mg total) by mouth daily. In the evening 04/06/24   Tower, Laine LABOR, MD  Triamcinolone  Acetonide (NASACORT  ALLERGY 24HR NA) Place 2 sprays into the nose daily.    [provider]  triamcinolone  cream (KENALOG ) 0.1 % Apply 1 Application topically 2 (two) times daily. 12/20/23   Mecum, Rocky BRAVO, PA-C                                                                                                                                    Past Surgical History  Past Surgical History:  Procedure Laterality Date   APPENDECTOMY     CESAREAN SECTION     X 2   WISDOM TOOTH EXTRACTION     Family History Family History  Problem Relation Age of Onset   Anxiety disorder Mother    Lupus Mother    Emphysema Mother    Coronary artery disease Mother    Lung cancer Mother    Hypertension Mother    Heart disease Mother    COPD Mother    Kidney disease Mother    Cardiomyopathy Father    Diabetes Father    Stroke Father    Hashimoto's thyroiditis Daughter    Colon cancer Neg Hx    Breast cancer Neg Hx    Colon polyps Neg Hx    Esophageal cancer Neg Hx    Rectal cancer Neg Hx    Stomach cancer Neg Hx     Social History Social History[1] Allergies Claritin [loratadine], Penicillins, Polymyxin b-trimethoprim, and Pseudoephedrine  Review of Systems Review of Systems  All other systems reviewed and are negative.   Physical Exam Vital Signs  I have reviewed the triage vital signs BP 124/64   Pulse 70   Temp 98.1 F (36.7 C) (Oral)   Resp 18   Ht 5' 3 (1.6 m)   Wt 72.6 kg   SpO2 95%   BMI 28.34 kg/m  Physical Exam Vitals and nursing note reviewed.  Constitutional:       Appearance: Normal appearance.  HENT:     Head: Normocephalic and atraumatic.     Mouth/Throat:     Mouth: Mucous membranes are moist.  Eyes:     Conjunctiva/sclera: Conjunctivae normal.  Cardiovascular:     Rate and Rhythm: Normal rate.  Pulmonary:     Effort: Pulmonary effort is normal. No respiratory distress.  Abdominal:     General: Abdomen is flat.  Musculoskeletal:        General: No deformity.     Right lower leg: No edema.     Left lower leg: No edema.     Comments: No lower extremity edema, able to range at the left hip, knee and ankle.  2+ DP pulse bilaterally.  No deformity.  No color change.  Skin:    General: Skin is warm and dry.     Capillary Refill: Capillary refill takes less than 2 seconds.  Neurological:     General: No focal deficit present.     Mental Status: She is alert. Mental status is at baseline.  Psychiatric:        Mood and Affect: Mood normal.        Behavior: Behavior normal.     ED Results and Treatments Labs (all labs ordered are listed, but only abnormal results are displayed) Labs Reviewed  BASIC METABOLIC PANEL WITH GFR - Abnormal; Notable for the following components:      Result Value   Glucose, Bld 108 (*)    All other components within normal limits  CBC WITH DIFFERENTIAL/PLATELET  CK  Radiology No results found.  Pertinent labs & imaging results that were available during my care of the patient were reviewed by me and considered in my medical decision making (see MDM for details).  Medications Ordered in ED Medications  ketorolac  (TORADOL ) 15 MG/ML injection 15 mg (15 mg Intravenous Given 06/11/24 0419)                                                                                                                                     Procedures Procedures  (including critical care time)  Medical  Decision Making / ED Course   MDM:  69 year old presenting to the emergency department with leg pain.  No clear cause of pain evident on exam.  Legs are symmetric without any focal joint pain, edema.  Distal circulation is intact.  Could be radiating from back but patient denies any back pain.  Patient was concerned about her new medication rosuvastatin , CK was checked and is reassuring.  Would still check ultrasound given unclear cause of symptoms, not available currently so patient will return later today for ultrasound.  Recommended further follow-up with sports medicine, patient has followed up with them previously for left leg pain symptoms  Will discharge patient to home. All questions answered. Patient comfortable with plan of discharge. Return precautions discussed with patient and specified on the after visit summary.         Additional history obtained: -Additional history obtained from spouse -External records from outside source obtained and reviewed including: Chart review including previous notes, labs, imaging, consultation notes including prior sports medicine notes    Lab Tests: -I ordered, reviewed, and interpreted labs.   The pertinent results include:   Labs Reviewed  BASIC METABOLIC PANEL WITH GFR - Abnormal; Notable for the following components:      Result Value   Glucose, Bld 108 (*)    All other components within normal limits  CBC WITH DIFFERENTIAL/PLATELET  CK    Notable for normal CK     Medicines ordered and prescription drug management: Meds ordered this encounter  Medications   DISCONTD: oxyCODONE -acetaminophen  (PERCOCET/ROXICET) 5-325 MG per tablet 1 tablet    Refill:  0   ketorolac  (TORADOL ) 15 MG/ML injection 15 mg    -I have reviewed the patients home medicines and have made adjustments as needed    Reevaluation: After the interventions noted above, I reevaluated the patient and found that their symptoms have improved  Co  morbidities that complicate the patient evaluation  Past Medical History:  Diagnosis Date   Allergy    Chronic sinusitis    Constipation    Hyperglycemia    IC (interstitial cystitis)    Plantar fasciitis    Pneumonia    Stress reaction    Trigger finger       Dispostion: Disposition decision including need for hospitalization was considered, and patient discharged from emergency department.    Final  Clinical Impression(s) / ED Diagnoses Final diagnoses:  Left leg pain     This chart was dictated using voice recognition software.  Despite best efforts to proofread,  errors can occur which can change the documentation meaning.     [1]  Social History Tobacco Use   Smoking status: Never   Smokeless tobacco: Never  Vaping Use   Vaping status: Never Used  Substance Use Topics   Alcohol use: Yes    Alcohol/week: 0.0 standard drinks of alcohol    Comment: wine -every 3-4 months   Drug use: No     Francesca Elsie CROME, MD 06/11/24 [phone]  "

## 2024-06-11 NOTE — Discharge Instructions (Signed)
 We evaluated you for your leg pain.  Your testing in the emergency department was reassuring.  We do not know the exact cause of your pain.  We do not think there is any dangerous cause of your symptoms.  Please take 1000 mg of Tylenol  every 6 hours.  You can also take your meloxicam  once daily.  We have you do outpatient ultrasound.  Please return for your ultrasound.  Please follow-up with your sports medicine doctor.  You have any new or worsening symptoms such as color change in your leg, swelling in your leg, fevers or chills, please return to the emergency department.

## 2024-06-14 ENCOUNTER — Ambulatory Visit: Admitting: Sports Medicine

## 2024-06-14 VITALS — BP 130/82 | HR 78 | Ht 63.0 in | Wt 165.0 lb

## 2024-06-14 DIAGNOSIS — G8929 Other chronic pain: Secondary | ICD-10-CM | POA: Diagnosis not present

## 2024-06-14 DIAGNOSIS — M25572 Pain in left ankle and joints of left foot: Secondary | ICD-10-CM | POA: Diagnosis not present

## 2024-06-14 DIAGNOSIS — M7662 Achilles tendinitis, left leg: Secondary | ICD-10-CM | POA: Diagnosis not present

## 2024-06-14 DIAGNOSIS — M5442 Lumbago with sciatica, left side: Secondary | ICD-10-CM

## 2024-06-14 MED ORDER — MELOXICAM 15 MG PO TABS: ORAL_TABLET | ORAL | 0 refills | Status: AC

## 2024-06-14 NOTE — Patient Instructions (Addendum)
-   Start meloxicam  15 mg daily x2 weeks.  If still having pain after 2 weeks, complete 3rd-week of NSAID. May use remaining NSAID as needed once daily for pain control.  Do not to use additional over-the-counter NSAIDs (ibuprofen, naproxen, Advil, Aleve, etc.) while taking prescription NSAIDs.  May use Tylenol  [phone] mg 2 to 3 times a day for breakthrough pain.  Meloxicam  refill   Low back HEP   4 week follow up

## 2024-06-14 NOTE — Progress Notes (Addendum)
 "               [facility] [address] [place] [address] Phone: [phone]   Assessment and Plan:     1. Chronic left-sided low back pain with left-sided sciatica (Primary) 2. Acute left ankle pain 3. Tendonitis, Achilles, left -Subacute with exacerbation, subsequent visit - Recurrence of left leg pain, however with new presentation that includes burning sensation to entirety of left lower leg, worse at night without specific MOI.  Most consistent with sciatica either from piriformis/gluteal muscle origin versus lumbar origin.  Patient does have mild remaining tenderness through left Achilles and gastrocnemius likely representing mild remaining Achilles tendinitis. - Negative lower extremity Doppler ultrasound at ER for DVT -Patient only completed a few days of meloxicam  after previous office visit we did not complete a full course.  Start meloxicam  15 mg daily x2 weeks.  If still having pain after 2 weeks, complete 3rd-week of NSAID. May use remaining NSAID as needed once daily for pain control.  Do not to use additional over-the-counter NSAIDs (ibuprofen, naproxen, Advil, Aleve, etc.) while taking prescription NSAIDs.  May use Tylenol  [phone] mg 2 to 3 times a day for breakthrough pain.  -Start HEP for low back  15 additional minutes spent for educating Therapeutic Home Exercise Program.  This included exercises focusing on stretching, strengthening, with focus on eccentric aspects.   Long term goals include an improvement in range of motion, strength, endurance as well as avoiding reinjury. Patient's frequency would include in 1-2 times a day, 3-5 times a week for a duration of 6-12 weeks. Proper technique shown and discussed handout in great detail with ATC.  All questions were discussed and answered.    Pertinent previous records reviewed include venous ultrasound 09/21/23, venous ultrasound 06/11/2024, ER note 06/11/2024, lab work including  BMP, CK, CBC from ER visit 06/11/2024, lumbar x-ray 2020 with mild degenerative changes   Follow Up: 4 weeks for reevaluation.  Could consider lumbar x-ray and lumbar MRI if no improvement or worsening of symptoms   Subjective:   I, Tracy Blankenship, am serving as a neurosurgeon for Doctor Morene Mace  Chief Complaint: back of ankle pain    HPI:    04/26/24 Patient is a 69 year old female presenting to [facility] for ankle pain. Patient states it started  on November 16th went to [employer] in [place]. 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 The pain started on November 19th. Today it seems to be fine. Took Tylenol  one or twice. Took left off meloxicam  from previous prescription for the first two week. Did heat and ice as well. Pain was so bad at one point that she could not bare weight on the foot. Pain in is the back of the heel at the achilles.    Ankle pain Swelling:no Mechanical: Weakness: no Numbness/tingling:no Aggravates: walking, sitting, stairs Tried: ice, heat, tylenol , meloxicam    06/14/2024 Patient states was seen in ED 06/11/2024 Tracy Blankenship is a 69 y.o. female without relevant past medical history presenting with left leg pain.  She reports that she has left leg pain for months.  Reports over the past few days has been worse.  Has been able to ambulate but it is very painful.  No fevers or chills or specific injury.  No back pain.       Relevant Historical Information: none  Additional pertinent review of  systems negative.  Current Medications[1]   Objective:     Vitals:   06/14/24 0757  BP: 130/82  Pulse: 78  SpO2: 97%  Weight: 165 lb (74.8 kg)  Height: 5' 3 (1.6 m)      Body mass index is 29.23 kg/m.    Physical Exam:    Gen: Appears well, nad, nontoxic and pleasant Psych: Alert and oriented, appropriate mood and affect Neuro: sensation intact, strength is 5/5 in upper and lower extremities, muscle  tone wnl Skin: no susupicious lesions or rashes  Back - Normal skin, Spine with normal alignment and no deformity.   No tenderness to vertebral process palpation.   Paraspinous muscles are not tender and without spasm NTTP gluteal musculature Straight leg raise negative Trendelenberg positive left Piriformis Test negative Gait normal  TTP left gastroc, Achilles  Electronically signed by:  [facility] 8:51 AM 06/14/24     [1]  Current Outpatient Medications:    meloxicam  (MOBIC ) 15 MG tablet, Take 1 tablet daily for 2 weeks.  If still in pain after 2 weeks, take 1 tablet daily for an additional 1 week., Disp: 30 tablet, Rfl: 0   Calcium  Carbonate-Vit D-Min (CALCIUM  1200) 1200-1000 MG-UNIT CHEW, Chew by mouth., Disp: , Rfl:    Cetirizine HCl 10 MG CAPS, Take 1 capsule by mouth daily as needed., Disp: , Rfl:    famotidine  (PEPCID ) 20 MG tablet, Take 1 tablet (20 mg total) by mouth 2 (two) times daily as needed for heartburn or indigestion., Disp: 60 tablet, Rfl: 11   meloxicam  (MOBIC ) 15 MG tablet, Take 1 tablet (15 mg total) by mouth daily., Disp: 30 tablet, Rfl: 0   Multiple Vitamin (MULTIVITAMIN) tablet, Take 1 tablet by mouth daily., Disp: , Rfl:    polyethylene glycol powder (MIRALAX) 17 GM/SCOOP powder, Take 1 Container by mouth daily as needed., Disp: , Rfl:    rosuvastatin  (CRESTOR ) 5 MG tablet, Take 1 tablet (5 mg total) by mouth daily. In the evening, Disp: 90 tablet, Rfl: 3   Triamcinolone  Acetonide (NASACORT  ALLERGY 24HR NA), Place 2 sprays into the nose daily., Disp: , Rfl:    triamcinolone  cream (KENALOG ) 0.1 %, Apply 1 Application topically 2 (two) times daily., Disp: 30 g, Rfl: 0  "

## 2024-07-12 ENCOUNTER — Ambulatory Visit: Admitting: Sports Medicine

## 2024-08-05 ENCOUNTER — Ambulatory Visit
